# Patient Record
Sex: Male | Born: 1975
Health system: Southern US, Community
[De-identification: ages and names within clinical notes are randomized; demographics above are authoritative.]

## PROBLEM LIST (undated history)

## (undated) DIAGNOSIS — F329 Major depressive disorder, single episode, unspecified: Secondary | ICD-10-CM

## (undated) HISTORY — PX: APPENDECTOMY: SHX54

## (undated) HISTORY — PX: NASAL SINUS SURGERY: SHX719

## (undated) HISTORY — DX: Major depressive disorder, single episode, unspecified: F32.9

---

## 2004-12-06 DIAGNOSIS — F32A Depression, unspecified: Secondary | ICD-10-CM

## 2004-12-06 HISTORY — DX: Depression, unspecified: F32.A

## 2016-03-01 ENCOUNTER — Encounter: Payer: Self-pay | Admitting: Family Medicine

## 2016-03-01 ENCOUNTER — Ambulatory Visit (INDEPENDENT_AMBULATORY_CARE_PROVIDER_SITE_OTHER): Payer: Medicaid Other | Admitting: Family Medicine

## 2016-03-01 VITALS — BP 116/80 | HR 79 | Temp 97.1°F | Ht 72.0 in | Wt 219.6 lb

## 2016-03-01 DIAGNOSIS — S8392XA Sprain of unspecified site of left knee, initial encounter: Secondary | ICD-10-CM | POA: Diagnosis not present

## 2016-03-01 DIAGNOSIS — S838X2A Sprain of other specified parts of left knee, initial encounter: Secondary | ICD-10-CM

## 2016-03-01 DIAGNOSIS — Z0000 Encounter for general adult medical examination without abnormal findings: Secondary | ICD-10-CM | POA: Diagnosis not present

## 2016-03-01 DIAGNOSIS — F17200 Nicotine dependence, unspecified, uncomplicated: Secondary | ICD-10-CM

## 2016-03-01 DIAGNOSIS — F411 Generalized anxiety disorder: Secondary | ICD-10-CM

## 2016-03-01 MED ORDER — METHYLPREDNISOLONE ACETATE 80 MG/ML IJ SUSP
80.0000 mg | Freq: Once | INTRAMUSCULAR | Status: AC
  Administered 2016-03-01: 80 mg via INTRA_ARTICULAR

## 2016-03-01 MED ORDER — BUPROPION HCL ER (XL) 150 MG PO TB24: 150.0000 mg | ORAL_TABLET | Freq: Every day | ORAL | Status: DC

## 2016-03-01 NOTE — Progress Notes (Signed)
BP 116/80 mmHg  Pulse 79  Temp(Src) 97.1 F (36.2 C) (Oral)  Ht 6' (1.829 m)  Wt 219 lb 9.6 oz (99.61 kg)  BMI 29.78 kg/m2   Subjective:    Patient ID: Jason Alexander, male    DOB: 1976-08-06, 40 y.o.   MRN: [id number]  HPI: Jason Alexander is a 40 y.o. male presenting on 03/01/2016 for Establish Care; Anxiety; and ADD   HPI Well adult exam Patient is coming in today to establish care with [place] and get a general well adult exam. He uses occasional allergy medication and ibuprofen for anti-inflammatory for his knee more recently. He denies any chest pain, shortness of breath, headaches or vision issues, abdominal complaints, diarrhea, nausea, vomiting.   Anxiety Patient is coming in for anxiety disorder and attention issues and sleep issues because of his anxiety and his mind racing. He is denies any sadness or feeling down. He says his anxiety as stemming from his family living situation. He has had it most of his life. He says is been on ADD medications previously since he was young. He denies any suicidal ideations or thoughts of hurting himself. He would like to discuss possible medications for both and going to psychiatry. He has been on multiple different medications previously.  Left knee pain. Patient comes in with complaints of left medial knee pain that he has had for the past month. She does not recall any specific incident where he injured it. He has been taking anti-inflammatories such as ibuprofen to help with that and it has helped some but not completely. He has had intermittently swelling in that knee as well. He has used some icing occasionally. He did play sports such as football and wrestling when he was in high school and thinks that the pain that he hasn't missed any may have stemmed from injuries when he was in high school. He denies any warmth or redness or fevers or chills. He denies any overlying skin changes.  Relevant past medical, surgical, family and social history  reviewed and updated as indicated. Interim medical history since our last visit reviewed. Allergies and medications reviewed and updated.  Review of Systems  Constitutional: Negative for fever and chills.  HENT: Negative for congestion, ear discharge and ear pain.   Eyes: Negative for discharge and visual disturbance.  Respiratory: Negative for cough, shortness of breath and wheezing.   Cardiovascular: Negative for chest pain and leg swelling.  Gastrointestinal: Negative for abdominal pain, diarrhea and constipation.  Genitourinary: Negative for difficulty urinating.  Musculoskeletal: Positive for joint swelling and arthralgias. Negative for back pain and gait problem.  Skin: Negative for rash.  Neurological: Negative for syncope, light-headedness and headaches.  Psychiatric/Behavioral: Positive for sleep disturbance. Negative for suicidal ideas, self-injury, dysphoric mood and decreased concentration. The patient is nervous/anxious.   All other systems reviewed and are negative.   Per HPI unless specifically indicated above  Social History   Social History  . Marital Status: Married    Spouse Name: N/A  . Number of Children: N/A  . Years of Education: N/A   Occupational History  . Not on file.   Social History Main Topics  . Smoking status: Current Every Day Smoker -- 2.00 packs/day for 20 years    Types: Cigarettes  . Smokeless tobacco: Former Systems developer    Quit date: 01/06/2011  . Alcohol Use: No  . Drug Use: Yes    Special: Marijuana     Comment: doesn't smoke, edibles  .  Sexual Activity: Yes     Comment: same partner male partner for 10 years, 2 kids   Other Topics Concern  . Not on file   Social History Narrative  . No narrative on file    Past Surgical History  Procedure Laterality Date  . Nasal sinus surgery      polyp removal  . Appendectomy      Family History  Problem Relation Age of Onset  . ADD / ADHD Mother   . Hepatitis C Mother   . HIV Mother     . COPD Mother   . Alcohol abuse Mother   . Drug abuse Mother   . Heart disease Mother   . Heart attack Mother   . Hypertension Sister       Medication List       This list is accurate as of: 03/01/16 10:35 AM.  Always use your most recent med list.               buPROPion 150 MG 24 hr tablet  Commonly known as:  WELLBUTRIN XL  Take 1 tablet (150 mg total) by mouth daily.     cetirizine 10 MG tablet  Commonly known as:  ZYRTEC  Take 10 mg by mouth daily.     ibuprofen 200 MG tablet  Commonly known as:  ADVIL,MOTRIN  Take 200 mg by mouth every 6 (six) hours as needed.           Objective:    BP 116/80 mmHg  Pulse 79  Temp(Src) 97.1 F (36.2 C) (Oral)  Ht 6' (1.829 m)  Wt 219 lb 9.6 oz (99.61 kg)  BMI 29.78 kg/m2  Wt Readings from Last 3 Encounters:  03/01/16 219 lb 9.6 oz (99.61 kg)    Physical Exam  Constitutional: He is oriented to person, place, and time. He appears well-developed and well-nourished. No distress.  Eyes: Conjunctivae and EOM are normal. Pupils are equal, round, and reactive to light. Right eye exhibits no discharge. No scleral icterus.  Cardiovascular: Normal rate, regular rhythm, normal heart sounds and intact distal pulses.   No murmur heard. Pulmonary/Chest: Effort normal and breath sounds normal. No respiratory distress. He has no wheezes.  Musculoskeletal: Normal range of motion. He exhibits no edema.       Left knee: He exhibits abnormal meniscus. He exhibits normal range of motion, no swelling, no erythema, normal alignment, no LCL laxity, normal patellar mobility, no bony tenderness and no MCL laxity. Tenderness found. Medial joint line tenderness noted.  Neurological: He is alert and oriented to person, place, and time. Coordination normal.  Skin: Skin is warm and dry. No rash noted. He is not diaphoretic.  Psychiatric: His speech is normal and behavior is normal. Judgment and thought content normal. His mood appears anxious. He does  not exhibit a depressed mood. He expresses no suicidal ideation. He expresses no suicidal plans.  Vitals reviewed.  Knee injection: Risk factors of bleeding and infection discussed with patient and patient is agreeable towards injection. Patient prepped with Betadine. Lateral approach towards injection used. Injected 62m of Depo-Medrol and 1 mL of 2% lidocaine. Patient tolerated procedure well and no side effects from noted. Minimal to no bleeding. Simple bandage applied after.  No results found for this or any previous visit.    Assessment & Plan:   Problem List Items Addressed This Visit      Other   Generalized anxiety disorder - Primary   Relevant Medications  buPROPion (WELLBUTRIN XL) 150 MG 24 hr tablet    Other Visit Diagnoses    Needs smoking cessation education        Relevant Medications    buPROPion (WELLBUTRIN XL) 150 MG 24 hr tablet    Other Relevant Orders    Ambulatory referral to Psychiatry    Well adult exam        Relevant Orders    HIV antibody (Completed)    CMP14+EGFR (Completed)    Lipid panel (Completed)    Meniscal injury, left, initial encounter        Relevant Medications    methylPREDNISolone acetate (DEPO-MEDROL) injection 80 mg (Completed)        Follow up plan: Return in about 4 weeks (around 03/29/2016), or if symptoms worsen or fail to improve, for recheck anxiety and smoking.  Caryl Pina, MD [facility] 03/01/2016, 10:35 AM

## 2016-03-02 LAB — CMP14+EGFR
ALT: 14 IU/L (ref 0–44)
AST: 14 IU/L (ref 0–40)
Albumin/Globulin Ratio: 1.6 (ref 1.2–2.2)
Albumin: 4.8 g/dL (ref 3.5–5.5)
Alkaline Phosphatase: 64 IU/L (ref 39–117)
BUN/Creatinine Ratio: 17 (ref 8–19)
BUN: 17 mg/dL (ref 6–20)
Bilirubin Total: 0.3 mg/dL (ref 0.0–1.2)
CALCIUM: 9.2 mg/dL (ref 8.7–10.2)
CO2: 21 mmol/L (ref 18–29)
CREATININE: 1.02 mg/dL (ref 0.76–1.27)
Chloride: 100 mmol/L (ref 96–106)
GFR, EST AFRICAN AMERICAN: 108 mL/min/{1.73_m2} (ref >59)
GFR, EST NON AFRICAN AMERICAN: 93 mL/min/{1.73_m2} (ref >59)
GLOBULIN, TOTAL: 3 g/dL (ref 1.5–4.5)
Glucose: 89 mg/dL (ref 65–99)
Potassium: 5 mmol/L (ref 3.5–5.2)
Sodium: 139 mmol/L (ref 134–144)
TOTAL PROTEIN: 7.8 g/dL (ref 6.0–8.5)

## 2016-03-02 LAB — LIPID PANEL
CHOL/HDL RATIO: 4.4 ratio (ref 0.0–5.0)
Cholesterol, Total: 271 mg/dL — ABNORMAL HIGH (ref 100–199)
HDL: 61 mg/dL (ref >39)
LDL CALC: 196 mg/dL — AB (ref 0–99)
TRIGLYCERIDES: 70 mg/dL (ref 0–149)
VLDL Cholesterol Cal: 14 mg/dL (ref 5–40)

## 2016-03-02 LAB — HIV ANTIBODY (ROUTINE TESTING W REFLEX): HIV Screen 4th Generation wRfx: NONREACTIVE

## 2016-03-03 MED ORDER — ATORVASTATIN CALCIUM 20 MG PO TABS: 20.0000 mg | ORAL_TABLET | Freq: Every day | ORAL | Status: DC

## 2016-03-16 ENCOUNTER — Telehealth (HOSPITAL_COMMUNITY): Payer: Self-pay | Admitting: *Deleted

## 2016-03-31 ENCOUNTER — Ambulatory Visit: Payer: Medicaid Other | Admitting: Family Medicine

## 2016-10-05 ENCOUNTER — Encounter: Payer: Self-pay | Admitting: Family Medicine

## 2016-10-05 ENCOUNTER — Ambulatory Visit (INDEPENDENT_AMBULATORY_CARE_PROVIDER_SITE_OTHER): Payer: Medicaid Other | Admitting: Family Medicine

## 2016-10-05 ENCOUNTER — Ambulatory Visit (INDEPENDENT_AMBULATORY_CARE_PROVIDER_SITE_OTHER): Payer: Medicaid Other

## 2016-10-05 ENCOUNTER — Encounter (INDEPENDENT_AMBULATORY_CARE_PROVIDER_SITE_OTHER): Payer: Self-pay

## 2016-10-05 VITALS — BP 131/75 | HR 73 | Temp 98.1°F | Ht 72.0 in | Wt 233.4 lb

## 2016-10-05 DIAGNOSIS — R05 Cough: Secondary | ICD-10-CM

## 2016-10-05 DIAGNOSIS — F329 Major depressive disorder, single episode, unspecified: Secondary | ICD-10-CM | POA: Insufficient documentation

## 2016-10-05 DIAGNOSIS — R059 Cough, unspecified: Secondary | ICD-10-CM

## 2016-10-05 DIAGNOSIS — R635 Abnormal weight gain: Secondary | ICD-10-CM | POA: Diagnosis not present

## 2016-10-05 DIAGNOSIS — E782 Mixed hyperlipidemia: Secondary | ICD-10-CM | POA: Diagnosis not present

## 2016-10-05 DIAGNOSIS — F17200 Nicotine dependence, unspecified, uncomplicated: Secondary | ICD-10-CM

## 2016-10-05 DIAGNOSIS — F3289 Other specified depressive episodes: Secondary | ICD-10-CM | POA: Diagnosis not present

## 2016-10-05 DIAGNOSIS — F32A Depression, unspecified: Secondary | ICD-10-CM | POA: Insufficient documentation

## 2016-10-05 DIAGNOSIS — R6882 Decreased libido: Secondary | ICD-10-CM

## 2016-10-05 DIAGNOSIS — N521 Erectile dysfunction due to diseases classified elsewhere: Secondary | ICD-10-CM | POA: Diagnosis not present

## 2016-10-05 MED ORDER — DULOXETINE HCL 30 MG PO CPEP: 30.0000 mg | ORAL_CAPSULE | Freq: Every day | ORAL | 0 refills | Status: DC

## 2016-10-05 NOTE — Progress Notes (Signed)
Subjective:  Patient ID: Jason Alexander, male    DOB: [birth year]  Age: 40 y.o. MRN: [id number]  CC: Anxiety (pt here today for routine follow up on cholesterol and anxiety/depression, pt only took his lipitor and wellbutrin for about one month and then didn't realize he needed to take them every day)   HPI Jason Alexander presents for symptoms related to fatigue, anxiety and depression: GAD 7 : Generalized Anxiety Score 10/05/2016  Nervous, Anxious, on Edge 1  Control/stop worrying 3  Worry too much - different things 3  Trouble relaxing 2  Restless 0  Easily annoyed or irritable 3  Afraid - awful might happen 0  Total GAD 7 Score 12  Anxiety Difficulty Extremely difficult    Depression screen [facility] 2/9 10/05/2016 10/05/2016 03/01/2016  Decreased Interest 3 0 3  Down, Depressed, Hopeless 3 0 1  PHQ - 2 Score 6 0 4  Altered sleeping 3 - 3  Tired, decreased energy 3 - 0  Change in appetite 2 - 1  Feeling bad or failure about yourself  3 - 1  Trouble concentrating 2 - 3  Moving slowly or fidgety/restless 2 - 0  Suicidal thoughts 0 - 0  PHQ-9 Score 21 - 12  Difficult doing work/chores - - Somewhat difficult   Feel like crap all the time. Nothing is satisfying any more. Feels weak, tired. No energy. Loss of strength. Irritable with everyone - wife and boss both have noticed and commented. Sx building for a year. Feels dyspneic. 2 ppd smoker for years. Recent cutback to 1/2 pack. Had twisted testicle 10 years ago. Less libido. Erections not as firm. Masturbating 2X/week, but intercourse down to I time a weekHas gained from185 with six pack to 231 in a year.  History Jason Alexander has a past medical history of Depression (2006).   He has a past surgical history that includes Nasal sinus surgery and Appendectomy.   His family history includes ADD / ADHD in his mother; Alcohol abuse in his mother; COPD in his mother; Drug abuse in his mother; HIV in his mother; Heart attack in his mother;  Heart disease in his mother; Hepatitis C in his mother; Hypertension in his sister.He reports that he has been smoking Cigarettes.  He has a 40.00 pack-year smoking history. He quit smokeless tobacco use about 5 years ago. He reports that he uses drugs, including Marijuana. He reports that he does not drink alcohol.    ROS Review of Systems  Constitutional: Positive for activity change, fatigue and unexpected weight change. Negative for chills, diaphoresis and fever.  HENT: Negative for congestion, hearing loss, rhinorrhea and sore throat.   Eyes: Negative for visual disturbance.  Respiratory: Positive for cough (spasms of cough awaken him during the night) and shortness of breath.   Cardiovascular: Negative for chest pain and palpitations.  Gastrointestinal: Negative for abdominal pain, constipation and diarrhea.  Genitourinary: Negative for dysuria and flank pain.  Musculoskeletal: Negative for arthralgias and joint swelling.  Skin: Negative for rash.  Neurological: Positive for dizziness (with cough). Negative for headaches.  Psychiatric/Behavioral: Negative for dysphoric mood and sleep disturbance.    Objective:  BP 131/75   Pulse 73   Temp 98.1 F (36.7 C) (Oral)   Ht 6' (1.829 m)   Wt 233 lb 6 oz (105.9 kg)   BMI 31.65 kg/m   BP Readings from Last 3 Encounters:  10/05/16 131/75  03/01/16 116/80    Wt Readings from Last 3 Encounters:  10/05/16  233 lb 6 oz (105.9 kg)  03/01/16 219 lb 9.6 oz (99.6 kg)     Physical Exam  Constitutional: He is oriented to person, place, and time. He appears well-developed and well-nourished. No distress.  HENT:  Head: Normocephalic and atraumatic.  Right Ear: External ear normal.  Left Ear: External ear normal.  Nose: Nose normal.  Mouth/Throat: Oropharynx is clear and moist.  Eyes: Conjunctivae and EOM are normal. Pupils are equal, round, and reactive to light.  Neck: Normal range of motion. Neck supple. No thyromegaly present.    Cardiovascular: Normal rate, regular rhythm and normal heart sounds.   No murmur heard. Pulmonary/Chest: Effort normal and breath sounds normal. No respiratory distress. He has no wheezes. He has no rales.  Abdominal: Soft. Bowel sounds are normal. He exhibits no distension. There is no tenderness. Hernia confirmed negative in the right inguinal area and confirmed negative in the left inguinal area.  Genitourinary: Penis normal. Right testis shows no mass, no swelling and no tenderness. Left testis shows no mass, no swelling and no tenderness.  Genitourinary Comments: Left testicle is small, less firm  Lymphadenopathy:    He has no cervical adenopathy.  Neurological: He is alert and oriented to person, place, and time. He has normal reflexes.  Skin: Skin is warm and dry.  Psychiatric: He has a normal mood and affect. His behavior is normal. Judgment and thought content normal.     Lab Results  Component Value Date   GLUCOSE 89 03/01/2016   CHOL 271 (H) 03/01/2016   TRIG 70 03/01/2016   HDL 61 03/01/2016   LDLCALC 196 (H) 03/01/2016   ALT 14 03/01/2016   AST 14 03/01/2016   NA 139 03/01/2016   K 5.0 03/01/2016   CL 100 03/01/2016   CREATININE 1.02 03/01/2016   BUN 17 03/01/2016   CO2 21 03/01/2016    Patient was never admitted.  Assessment & Plan:   Jason Alexander was seen today for anxiety.  Diagnoses and all orders for this visit:  Loss of libido -     CBC with Differential/Platelet -     CMP14+EGFR -     Thyroid Panel With TSH -     Testosterone,Free and Total  Other depression -     CBC with Differential/Platelet -     CMP14+EGFR -     Thyroid Panel With TSH -     Testosterone,Free and Total  Cough -     CBC with Differential/Platelet -     CMP14+EGFR -     DG Chest 2 View; Future -     PR BREATHING CAPACITY TEST  Current every day smoker -     CBC with Differential/Platelet -     CMP14+EGFR -     DG Chest 2 View; Future -     PR BREATHING CAPACITY  TEST  Weight gain, abnormal -     CBC with Differential/Platelet -     CMP14+EGFR -     Thyroid Panel With TSH -     Testosterone,Free and Total  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 For one week then two daily. Take with a full stomach at suppertime    I have discontinued Mr.  Alexander's ibuprofen and cetirizine. I am also having him start on DULoxetine. Additionally, I am having him maintain his buPROPion and atorvastatin.  Meds ordered this encounter  Medications  . DULoxetine (CYMBALTA) 30 MG capsule    Sig: Take 1 capsule (30 mg total) by mouth daily. For one week then two daily. Take with a full stomach at suppertime    Dispense:  60 capsule    Refill:  0     Follow-up: Return in about 10 days (around 10/15/2016) for Depression.  Claretta Fraise, M.D.

## 2016-10-06 LAB — LIPID PANEL
CHOLESTEROL TOTAL: 261 mg/dL — AB (ref 100–199)
Chol/HDL Ratio: 5.7 ratio units — ABNORMAL HIGH (ref 0.0–5.0)
HDL: 46 mg/dL (ref >39)
LDL Calculated: 197 mg/dL — ABNORMAL HIGH (ref 0–99)
Triglycerides: 88 mg/dL (ref 0–149)
VLDL CHOLESTEROL CAL: 18 mg/dL (ref 5–40)

## 2016-10-06 LAB — CBC WITH DIFFERENTIAL/PLATELET
BASOS ABS: 0 10*3/uL (ref 0.0–0.2)
Basos: 0 %
EOS (ABSOLUTE): 0.3 10*3/uL (ref 0.0–0.4)
Eos: 3 %
HEMOGLOBIN: 14.7 g/dL (ref 12.6–17.7)
Hematocrit: 43.8 % (ref 37.5–51.0)
Immature Grans (Abs): 0 10*3/uL (ref 0.0–0.1)
Immature Granulocytes: 0 %
LYMPHS ABS: 2 10*3/uL (ref 0.7–3.1)
Lymphs: 23 %
MCH: 31.7 pg (ref 26.6–33.0)
MCHC: 33.6 g/dL (ref 31.5–35.7)
MCV: 95 fL (ref 79–97)
MONOCYTES: 7 %
MONOS ABS: 0.6 10*3/uL (ref 0.1–0.9)
Neutrophils Absolute: 5.9 10*3/uL (ref 1.4–7.0)
Neutrophils: 67 %
PLATELETS: 276 10*3/uL (ref 150–379)
RBC: 4.63 x10E6/uL (ref 4.14–5.80)
RDW: 13.9 % (ref 12.3–15.4)
WBC: 8.7 10*3/uL (ref 3.4–10.8)

## 2016-10-06 LAB — TESTOSTERONE,FREE AND TOTAL
TESTOSTERONE FREE: 12.7 pg/mL (ref 8.7–25.1)
Testosterone: 386 ng/dL (ref 264–916)

## 2016-10-06 LAB — CMP14+EGFR
ALK PHOS: 59 IU/L (ref 39–117)
ALT: 13 IU/L (ref 0–44)
AST: 15 IU/L (ref 0–40)
Albumin/Globulin Ratio: 1.7 (ref 1.2–2.2)
Albumin: 4.6 g/dL (ref 3.5–5.5)
BILIRUBIN TOTAL: 0.3 mg/dL (ref 0.0–1.2)
BUN/Creatinine Ratio: 15 (ref 9–20)
BUN: 15 mg/dL (ref 6–20)
CHLORIDE: 101 mmol/L (ref 96–106)
CO2: 22 mmol/L (ref 18–29)
CREATININE: 1 mg/dL (ref 0.76–1.27)
Calcium: 9.3 mg/dL (ref 8.7–10.2)
GFR calc Af Amer: 111 mL/min/{1.73_m2} (ref >59)
GFR calc non Af Amer: 96 mL/min/{1.73_m2} (ref >59)
GLUCOSE: 81 mg/dL (ref 65–99)
Globulin, Total: 2.7 g/dL (ref 1.5–4.5)
Potassium: 4.7 mmol/L (ref 3.5–5.2)
Sodium: 139 mmol/L (ref 134–144)
Total Protein: 7.3 g/dL (ref 6.0–8.5)

## 2016-10-06 LAB — THYROID PANEL WITH TSH
Free Thyroxine Index: 2.2 (ref 1.2–4.9)
T3 Uptake Ratio: 31 % (ref 24–39)
T4, Total: 7 ug/dL (ref 4.5–12.0)
TSH: 0.979 u[IU]/mL (ref 0.450–4.500)

## 2016-10-11 ENCOUNTER — Other Ambulatory Visit: Payer: Self-pay | Admitting: *Deleted

## 2016-10-11 MED ORDER — ATORVASTATIN CALCIUM 40 MG PO TABS: 40.0000 mg | ORAL_TABLET | Freq: Every day | ORAL | 3 refills | Status: DC

## 2016-10-18 ENCOUNTER — Ambulatory Visit: Payer: Medicaid Other | Admitting: Family Medicine

## 2016-10-25 ENCOUNTER — Encounter: Payer: Self-pay | Admitting: Family Medicine

## 2016-10-25 ENCOUNTER — Ambulatory Visit (INDEPENDENT_AMBULATORY_CARE_PROVIDER_SITE_OTHER): Payer: Medicaid Other | Admitting: Family Medicine

## 2016-10-25 VITALS — BP 112/69 | HR 72 | Temp 98.1°F | Ht 72.0 in | Wt 230.0 lb

## 2016-10-25 DIAGNOSIS — F411 Generalized anxiety disorder: Secondary | ICD-10-CM

## 2016-10-25 DIAGNOSIS — F3289 Other specified depressive episodes: Secondary | ICD-10-CM

## 2016-10-25 MED ORDER — DULOXETINE HCL 60 MG PO CPEP: 60.0000 mg | ORAL_CAPSULE | Freq: Every day | ORAL | 1 refills | Status: DC

## 2016-10-25 MED ORDER — TRAZODONE HCL 150 MG PO TABS: ORAL_TABLET | ORAL | 5 refills | Status: DC

## 2016-10-25 NOTE — Progress Notes (Signed)
Subjective:  Patient ID: Jason Alexander, male    DOB: 12-Feb-1976  Age: 40 y.o. MRN: [id number]  CC: Depression (pt here today following up for anxiety/depression, he started Cymbalta about 3 weeks ago and says he doesn't think it is working. )   HPI Jason Alexander presents for Feeling anxious and on hemorrhage every day. He says that he was irritable when he was checking in due to some type of insurance requirement he was asked to meet. He feels like he is almost blacking out when he becomes anxious. He is also not sleeping. Consider an average of 2-3 hours per night. Patient now says 6-8 years ago he had to be on 800 mg a day and Seroquel for depression. He was also taking trazodone at that time for sleep. He was living in [place] at that time. Social situation then as now is complicated.  [facility] 10/25/2016 10/05/2016 10/05/2016 03/01/2016  Decreased Interest 3 3 0 3  Down, Depressed, Hopeless 3 3 0 1  PHQ - 2 Score 6 6 0 4  Altered sleeping 2 3 - 3  Tired, decreased energy 3 3 - 0  Change in appetite 1 2 - 1  Feeling bad or failure about yourself  3 3 - 1  Trouble concentrating 3 2 - 3  Moving slowly or fidgety/restless 2 2 - 0  Suicidal thoughts 0 0 - 0  PHQ-9 Score 20 21 - 12  Difficult doing work/chores - - - Somewhat difficult    History Jason Alexander has a past medical history of Depression (2006).   He has a past surgical history that includes Nasal sinus surgery and Appendectomy.   His family history includes ADD / ADHD in his mother; Alcohol abuse in his mother; COPD in his mother; Drug abuse in his mother; HIV in his mother; Heart attack in his mother; Heart disease in his mother; Hepatitis C in his mother; Hypertension in his sister.He reports that he has been smoking Cigarettes.  He has a 40.00 pack-year smoking history. He quit smokeless tobacco use about 5 years ago. He reports that he uses drugs, including Marijuana. He reports that he does not drink  alcohol.    ROS Review of Systems  Constitutional: Negative for chills, diaphoresis and fever.  HENT: Negative for rhinorrhea and sore throat.   Respiratory: Negative for cough and shortness of breath.   Cardiovascular: Negative for chest pain.  Gastrointestinal: Negative for abdominal pain.  Musculoskeletal: Negative for arthralgias and myalgias.  Skin: Negative for rash.  Neurological: Negative for weakness and headaches.  Psychiatric/Behavioral: Positive for agitation, behavioral problems, decreased concentration and sleep disturbance. The patient is nervous/anxious.     Objective:  BP 112/69   Pulse 72   Temp 98.1 F (36.7 C) (Oral)   Ht 6' (1.829 m)   Wt 230 lb (104.3 kg)   BMI 31.19 kg/m   BP Readings from Last 3 Encounters:  10/25/16 112/69  10/05/16 131/75  03/01/16 116/80    Wt Readings from Last 3 Encounters:  10/25/16 230 lb (104.3 kg)  10/05/16 233 lb 6 oz (105.9 kg)  03/01/16 219 lb 9.6 oz (99.6 kg)     Physical Exam  Constitutional: He appears well-developed and well-nourished.  HENT:  Head: Normocephalic and atraumatic.  Right Ear: Tympanic membrane and external ear normal. No decreased hearing is noted.  Left Ear: Tympanic membrane and external ear normal. No decreased hearing is noted.  Mouth/Throat: No oropharyngeal exudate or posterior oropharyngeal erythema.  Eyes: Pupils are equal, round, and reactive to light.  Neck: Normal range of motion. Neck supple.  Cardiovascular: Normal rate and regular rhythm.   No murmur heard. Pulmonary/Chest: Breath sounds normal. No respiratory distress.  Abdominal: Soft. Bowel sounds are normal. He exhibits no mass. There is no tenderness.  Vitals reviewed.    Lab Results  Component Value Date   WBC 8.7 10/05/2016   HCT 43.8 10/05/2016   PLT 276 10/05/2016   GLUCOSE 81 10/05/2016   CHOL 261 (H) 10/05/2016   TRIG 88 10/05/2016   HDL 46 10/05/2016   LDLCALC 197 (H) 10/05/2016   ALT 13 10/05/2016    AST 15 10/05/2016   NA 139 10/05/2016   K 4.7 10/05/2016   CL 101 10/05/2016   CREATININE 1.00 10/05/2016   BUN 15 10/05/2016   CO2 22 10/05/2016   TSH 0.979 10/05/2016    Patient was never admitted.  Assessment & Plan:   Jason Alexander was seen today for depression.  Diagnoses and all orders for this visit:  Other depression  Generalized anxiety disorder  Other orders -     DULoxetine (CYMBALTA) 60 MG capsule; Take 1 capsule (60 mg total) by mouth daily. For one week then two daily. Take with a full stomach at suppertime -     traZODone (DESYREL) 150 MG tablet; Use from 1/3 to 1 tablet nightly as needed for sleep.    I have discontinued Mr. Cameron AliRanew's buPROPion. I have also changed his DULoxetine. Additionally, I am having him start on traZODone. Lastly, I am having him maintain his atorvastatin.  Meds ordered this encounter  Medications  . DULoxetine (CYMBALTA) 60 MG capsule    Sig: Take 1 capsule (60 mg total) by mouth daily. For one week then two daily. Take with a full stomach at suppertime    Dispense:  60 capsule    Refill:  1  . traZODone (DESYREL) 150 MG tablet    Sig: Use from 1/3 to 1 tablet nightly as needed for sleep.    Dispense:  30 tablet    Refill:  5     Follow-up: Return in about 2 weeks (around 11/08/2016).  Mechele ClaudeWarren Jacilyn Sanpedro, M.D.

## 2016-11-08 ENCOUNTER — Encounter: Payer: Self-pay | Admitting: Family Medicine

## 2016-11-08 ENCOUNTER — Ambulatory Visit (INDEPENDENT_AMBULATORY_CARE_PROVIDER_SITE_OTHER): Payer: Medicaid Other | Admitting: Family Medicine

## 2016-11-08 VITALS — BP 144/97 | HR 89 | Temp 97.2°F | Ht 72.0 in | Wt 225.0 lb

## 2016-11-08 DIAGNOSIS — N528 Other male erectile dysfunction: Secondary | ICD-10-CM | POA: Insufficient documentation

## 2016-11-08 DIAGNOSIS — F3289 Other specified depressive episodes: Secondary | ICD-10-CM | POA: Diagnosis not present

## 2016-11-08 MED ORDER — SILDENAFIL CITRATE 20 MG PO TABS: 20.0000 mg | ORAL_TABLET | Freq: Every day | ORAL | 5 refills | Status: DC | PRN

## 2016-11-08 MED ORDER — MIRTAZAPINE 45 MG PO TABS: 45.0000 mg | ORAL_TABLET | Freq: Every day | ORAL | 2 refills | Status: DC

## 2016-11-08 NOTE — Patient Instructions (Signed)
Discontinue the "Gummies" for sleep. Discontinue trazodone and cymbalta. Instead take remeron (mirtazipine) at bedtime. Get the generic for vi[facility] [place]

## 2016-11-08 NOTE — Progress Notes (Signed)
Subjective:  Patient ID: Jason Alexander, male    DOB: [birth year]  Age: 40 y.o. MRN: [id number]  CC: Follow-up (2 wk rck on new anti depressant meds)   HPI Jason Alexander presents for Continued concerns about depression. He says he couldn't tolerate the higher dose of Cymbalta so he went back to once a day. Unfortunately this is lead to symptoms noted below on the pH Q. [facility] screen [facility] 11/08/2016 10/25/2016 10/05/2016 10/05/2016 03/01/2016  Decreased Interest 2 3 3  0 3  Down, Depressed, Hopeless 1 3 3  0 1  PHQ - 2 Score 3 6 6  0 4  Altered sleeping 1 2 3  - 3  Tired, decreased energy 3 3 3  - 0  Change in appetite 1 1 2  - 1  Feeling bad or failure about yourself  2 3 3  - 1  Trouble concentrating 3 3 2  - 3  Moving slowly or fidgety/restless 3 2 2  - 0  Suicidal thoughts 0 0 0 - 0  PHQ-9 Score 16 20 21  - 12  Difficult doing work/chores - - - - Somewhat difficult   He also notes that his sex drive is very low and because of this at times he can't have an erection. This is leading to frustration for both he and his wife. He is not sleeping well. The trazodone doesn't help him sleep it just makes him feel weird and spaced out. He says he is taking marijuana [place] to help him sleep at night.  History Jason Alexander has a past medical history of Depression (2006).   He has a past surgical history that includes Nasal sinus surgery and Appendectomy.   His family history includes ADD / ADHD in his mother; Alcohol abuse in his mother; COPD in his mother; Drug abuse in his mother; HIV in his mother; Heart attack in his mother; Heart disease in his mother; Hepatitis C in his mother; Hypertension in his sister.He reports that he has been smoking Cigarettes.  He has a 40.00 pack-year smoking history. He quit smokeless tobacco use about 5 years ago. He reports that he uses drugs, including Marijuana. He reports that he does not drink alcohol.    ROS Review of Systems  Constitutional: Negative for  chills, diaphoresis and fever.  HENT: Negative for rhinorrhea and sore throat.   Respiratory: Negative for cough and shortness of breath.   Cardiovascular: Negative for chest pain.  Gastrointestinal: Negative for abdominal pain.  Musculoskeletal: Negative for arthralgias and myalgias.  Skin: Negative for rash.  Neurological: Negative for weakness and headaches.  Psychiatric/Behavioral: Positive for confusion, decreased concentration, dysphoric mood and sleep disturbance. Negative for self-injury and suicidal ideas. The patient is nervous/anxious.     Objective:  BP (!) 144/97   Pulse 89   Temp 97.2 F (36.2 C) (Oral)   Ht 6' (1.829 m)   Wt 225 lb (102.1 kg)   BMI 30.52 kg/m   BP Readings from Last 3 Encounters:  11/08/16 (!) 144/97  10/25/16 112/69  10/05/16 131/75    Wt Readings from Last 3 Encounters:  11/08/16 225 lb (102.1 kg)  10/25/16 230 lb (104.3 kg)  10/05/16 233 lb 6 oz (105.9 kg)     Physical Exam  Constitutional: He is oriented to person, place, and time. He appears well-developed and well-nourished. No distress.  HENT:  Head: Normocephalic and atraumatic.  Right Ear: External ear normal.  Left Ear: External ear normal.  Nose: Nose normal.  Mouth/Throat: Oropharynx is clear and moist.  Eyes: Conjunctivae and EOM are normal. Pupils are equal, round, and reactive to light.  Neck: Normal range of motion. Neck supple. No thyromegaly present.  Cardiovascular: Normal rate, regular rhythm and normal heart sounds.   No murmur heard. Pulmonary/Chest: Effort normal and breath sounds normal. No respiratory distress. He has no wheezes. He has no rales.  Abdominal: Soft. Bowel sounds are normal. He exhibits no distension. There is no tenderness.  Lymphadenopathy:    He has no cervical adenopathy.  Neurological: He is alert and oriented to person, place, and time. He has normal reflexes.  Skin: Skin is warm and dry.  Psychiatric: He has a normal mood and affect. His  behavior is normal. Judgment and thought content normal.     Lab Results  Component Value Date   WBC 8.7 10/05/2016   HCT 43.8 10/05/2016   PLT 276 10/05/2016   GLUCOSE 81 10/05/2016   CHOL 261 (H) 10/05/2016   TRIG 88 10/05/2016   HDL 46 10/05/2016   LDLCALC 197 (H) 10/05/2016   ALT 13 10/05/2016   AST 15 10/05/2016   NA 139 10/05/2016   K 4.7 10/05/2016   CL 101 10/05/2016   CREATININE 1.00 10/05/2016   BUN 15 10/05/2016   CO2 22 10/05/2016   TSH 0.979 10/05/2016    Patient was never admitted.  Assessment & Plan:   Jason Alexander was seen today for follow-up.  Diagnoses and all orders for this visit:  Other depression  Other male erectile dysfunction  Other orders -     mirtazapine (REMERON) 45 MG tablet; Take 1 tablet (45 mg total) by mouth at bedtime. For depression and sleep -     sildenafil (REVATIO) 20 MG tablet; Take 1 tablet (20 mg total) by mouth daily as needed (2-5 as needed).   Discontinue the "Gummies" for sleep. (marijuana) Discontinue trazodone and cymbalta. Instead take remeron (mirtazipine) at bedtime. [facility] the generic for vi[facility] [place]   I have discontinued Mr. Cameron AliRanew's DULoxetine and traZODone. I am also having him start on mirtazapine and sildenafil. Additionally, I am having him maintain his atorvastatin.  Meds ordered this encounter  Medications  . mirtazapine (REMERON) 45 MG tablet    Sig: Take 1 tablet (45 mg total) by mouth at bedtime. For depression and sleep    Dispense:  30 tablet    Refill:  2  . sildenafil (REVATIO) 20 MG tablet    Sig: Take 1 tablet (20 mg total) by mouth daily as needed (2-5 as needed).    Dispense:  50 tablet    Refill:  5     Follow-up: Return in about 1 month (around 12/09/2016).  Mechele ClaudeWarren Josehua Hammar, M.D.

## 2016-11-24 ENCOUNTER — Encounter: Payer: Self-pay | Admitting: *Deleted

## 2016-12-13 ENCOUNTER — Ambulatory Visit: Payer: Medicaid Other | Admitting: Family Medicine

## 2016-12-14 ENCOUNTER — Ambulatory Visit: Payer: Medicaid Other | Admitting: Family Medicine

## 2016-12-15 ENCOUNTER — Encounter: Payer: Self-pay | Admitting: Family Medicine

## 2016-12-15 ENCOUNTER — Telehealth: Payer: Self-pay | Admitting: Family Medicine

## 2017-02-24 ENCOUNTER — Emergency Department (HOSPITAL_COMMUNITY): Payer: Medicaid Other

## 2017-02-24 ENCOUNTER — Emergency Department (HOSPITAL_COMMUNITY)
Admission: EM | Admit: 2017-02-24 | Discharge: 2017-02-24 | Disposition: A | Discharge status: Home / Self Care | Payer: Medicaid Other | Attending: Emergency Medicine | Admitting: Emergency Medicine

## 2017-02-24 ENCOUNTER — Encounter (HOSPITAL_COMMUNITY): Payer: Self-pay

## 2017-02-24 DIAGNOSIS — N201 Calculus of ureter: Secondary | ICD-10-CM | POA: Diagnosis not present

## 2017-02-24 DIAGNOSIS — F17210 Nicotine dependence, cigarettes, uncomplicated: Secondary | ICD-10-CM | POA: Diagnosis not present

## 2017-02-24 DIAGNOSIS — N50812 Left testicular pain: Secondary | ICD-10-CM | POA: Diagnosis not present

## 2017-02-24 DIAGNOSIS — R109 Unspecified abdominal pain: Secondary | ICD-10-CM | POA: Diagnosis present

## 2017-02-24 LAB — URINALYSIS, MICROSCOPIC (REFLEX)
SQUAMOUS EPITHELIAL / LPF: NONE SEEN
WBC UA: NONE SEEN WBC/hpf (ref 0–5)

## 2017-02-24 LAB — CBC
HEMATOCRIT: 39.1 % (ref 39.0–52.0)
HEMOGLOBIN: 13.8 g/dL (ref 13.0–17.0)
MCH: 31.2 pg (ref 26.0–34.0)
MCHC: 35.3 g/dL (ref 30.0–36.0)
MCV: 88.5 fL (ref 78.0–100.0)
Platelets: 250 10*3/uL (ref 150–400)
RBC: 4.42 MIL/uL (ref 4.22–5.81)
RDW: 12.8 % (ref 11.5–15.5)
WBC: 7.9 10*3/uL (ref 4.0–10.5)

## 2017-02-24 LAB — URINALYSIS, ROUTINE W REFLEX MICROSCOPIC
Bilirubin Urine: NEGATIVE
Glucose, UA: NEGATIVE mg/dL
Ketones, ur: 15 mg/dL — AB
LEUKOCYTES UA: NEGATIVE
Nitrite: NEGATIVE
PROTEIN: NEGATIVE mg/dL
Specific Gravity, Urine: 1.02 (ref 1.005–1.030)
pH: 8 (ref 5.0–8.0)

## 2017-02-24 LAB — BASIC METABOLIC PANEL
ANION GAP: 11 (ref 5–15)
BUN: 18 mg/dL (ref 6–20)
CALCIUM: 10.1 mg/dL (ref 8.9–10.3)
CHLORIDE: 102 mmol/L (ref 101–111)
CO2: 26 mmol/L (ref 22–32)
Creatinine, Ser: 1.3 mg/dL — ABNORMAL HIGH (ref 0.61–1.24)
GFR calc non Af Amer: >60 mL/min (ref >60)
GLUCOSE: 118 mg/dL — AB (ref 65–99)
POTASSIUM: 3.5 mmol/L (ref 3.5–5.1)
Sodium: 139 mmol/L (ref 135–145)

## 2017-02-24 MED ORDER — FENTANYL CITRATE (PF) 100 MCG/2ML IJ SOLN
50.0000 ug | Freq: Once | INTRAMUSCULAR | Status: AC
  Administered 2017-02-24: 50 ug via INTRAVENOUS
  Filled 2017-02-24: qty 2

## 2017-02-24 MED ORDER — ONDANSETRON HCL 4 MG PO TABS: 4.0000 mg | ORAL_TABLET | Freq: Three times a day (TID) | ORAL | 0 refills | Status: DC | PRN

## 2017-02-24 MED ORDER — ONDANSETRON HCL 4 MG/2ML IJ SOLN
4.0000 mg | Freq: Once | INTRAMUSCULAR | Status: AC
  Administered 2017-02-24: 4 mg via INTRAVENOUS
  Filled 2017-02-24: qty 2

## 2017-02-24 MED ORDER — TAMSULOSIN HCL 0.4 MG PO CAPS: ORAL_CAPSULE | ORAL | 0 refills | Status: DC

## 2017-02-24 MED ORDER — KETOROLAC TROMETHAMINE 30 MG/ML IJ SOLN
30.0000 mg | Freq: Once | INTRAMUSCULAR | Status: AC
  Administered 2017-02-24: 30 mg via INTRAVENOUS
  Filled 2017-02-24: qty 1

## 2017-02-24 MED ORDER — OXYCODONE-ACETAMINOPHEN 5-325 MG PO TABS: 1.0000 | ORAL_TABLET | Freq: Four times a day (QID) | ORAL | 0 refills | Status: DC | PRN

## 2017-02-24 MED ORDER — OXYCODONE-ACETAMINOPHEN 5-325 MG PO TABS
1.0000 | ORAL_TABLET | Freq: Once | ORAL | Status: AC
Start: 2017-02-24 — End: 2017-02-24
  Administered 2017-02-24: 1 via ORAL
  Filled 2017-02-24: qty 1

## 2017-02-24 NOTE — ED Provider Notes (Signed)
10:13 AM Assumed care from Dr. Lynelle DoctorKnapp, please see their note for full history, physical and decision making until this point. In brief this is a 41 y.o. year old male who presented to the ED tonight with Flank Pain     US to ensure no malignancy. otherwise uro follow up.   Patient was some return of his pain. Otherwise all sounds negative. Urine has some bacteria so urine culture added but will not treat at this time. Plan for discharge with urology follow-up if not improving. Otherwise pain medicine and increase fluid intake at home.  Discharge instructions, including strict return precautions for new or worsening symptoms, given. Patient and/or family verbalized understanding and agreement with the plan as described.   Labs, studies and imaging reviewed by myself and considered in medical decision making if ordered. Imaging interpreted by radiology.  Labs Reviewed  URINALYSIS, ROUTINE W REFLEX MICROSCOPIC - Abnormal; Notable for the following:       Result Value   APPearance TURBID (*)    Hgb urine dipstick LARGE (*)    Ketones, ur 15 (*)    All other components within normal limits  BASIC METABOLIC PANEL - Abnormal; Notable for the following:    Glucose, Bld 118 (*)    Creatinine, Ser 1.30 (*)    All other components within normal limits  URINALYSIS, MICROSCOPIC (REFLEX) - Abnormal; Notable for the following:    Bacteria, UA MANY (*)    All other components within normal limits  URINE CULTURE  CBC    US Scrotum  Final Result    US Art/Ven Flow Abd Pelv Doppler  Final Result    CT Renal Stone Study  Final Result      No Follow-up on file.    Marily MemosJason Jaicee Michelotti, MD 02/24/17 1013

## 2017-02-24 NOTE — ED Notes (Signed)
Pt sleeping at this time.

## 2017-02-24 NOTE — ED Triage Notes (Signed)
Patient states that he is having pain in his left side, back, and radiating into groin.  Urine is dark.  Feels like it is on fire.

## 2017-02-24 NOTE — ED Notes (Addendum)
Pt made aware to return if symptoms worsen or if any life threatening symptoms occur.  Pt given strainer and urine cup.

## 2017-02-24 NOTE — Discharge Instructions (Signed)
Drink plenty of fluids. Take the medications as described. You can be rechecked by Dr Ronne BinningMcKenzie, a Urologist (kidney specialist) if you aren't passing the stone. Return to the ED if you get a fever, have uncontrolled vomiting or pain.

## 2017-02-24 NOTE — ED Provider Notes (Signed)
[facility]-EMER[facility]ENCY DEPT Provider Note   CSN: [id number] Arrival date & time: 02/24/17  0543  Time seen 05:50 AM   History   Chief Complaint Chief Complaint  Patient presents with  . Flank Pain    HPI Jason Alexander is a 41 y.o. male.  HPI  patient presents via EMS. He states about 2 hours ago he was still awake because he couldn't sleep because his back was sore. He started getting pain in his left testicle that radiated up into his left abdomen. He has had nausea and dry heaves. He is not vomiting. He states he has had some dark urine but no gross hematuria. He states nothing he does makes the pain hurt more, nothing he does makes it feel better. He is unaware of any family history of kidney stones. 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 He states his urine has been dark for a couple days and he thought maybe he was dehydrated. States he thinks there is a new knot behind his left testicle.   PCP Dr Darlyn Read  Past Medical History:  Diagnosis Date  . Depression 2006   after divorce    Patient Active Problem List   Diagnosis Date Noted  . Other male erectile dysfunction 11/08/2016  . Depression 10/05/2016  . Current every day smoker 10/05/2016  . Weight gain, abnormal 10/05/2016  . Generalized anxiety disorder 03/01/2016    Past Surgical History:  Procedure Laterality Date  . APPENDECTOMY    . NASAL SINUS SURGERY     polyp removal       Home Medications    Prior to Admission medications   Medication Sig Start Date End Date Taking? Authorizing Provider  atorvastatin (LIPITOR) 40 MG tablet Take 1 tablet (40 mg total) by mouth daily. 10/11/16   Mechele Claude, MD  mirtazapine (REMERON) 45 MG tablet Take 1 tablet (45 mg total) by mouth at bedtime. For depression and sleep 11/08/16   Mechele Claude, MD  ondansetron (ZOFRAN) 4 MG tablet Take 1 tablet (4 mg total) by mouth every 8 (eight) hours as needed for nausea or vomiting. 02/24/17   Devoria Albe, MD    oxyCODONE-acetaminophen (PERCOCET/ROXICET) 5-325 MG tablet Take 1 tablet by mouth every 6 (six) hours as needed for severe pain. 02/24/17   Devoria Albe, MD  sildenafil (REVATIO) 20 MG tablet Take 1 tablet (20 mg total) by mouth daily as needed (2-5 as needed). 11/08/16   Mechele Claude, MD  tamsulosin (FLOMAX) 0.4 MG CAPS capsule Take 1 po QD until you pass the stone. 02/24/17   Devoria Albe, MD    Family History Family History  Problem Relation Age of Onset  . ADD / ADHD Mother   . Hepatitis C Mother   . HIV Mother   . COPD Mother   . Alcohol abuse Mother   . Drug abuse Mother   . Heart disease Mother   . Heart attack Mother   . Hypertension Sister     Social History Social History  Substance Use Topics  . Smoking status: Current Every Day Smoker    Packs/day: 2.00    Years: 20.00    Types: Cigarettes  . Smokeless tobacco: Former Neurosurgeon    Quit date: 01/06/2011  . Alcohol use No  employed   Allergies   Depakote [divalproex sodium]   Review of Systems Review of Systems  All other systems reviewed and are negative.    Physical Exam Updated Vital Signs BP 121/90 (BP Location: Left Arm)  Pulse 84   Resp (!) 24   Ht 6' (1.829 m)   Wt 215 lb (97.5 kg)   SpO2 98%   BMI 29.16 kg/m   Vital signs normal    Physical Exam  Constitutional: He is oriented to person, place, and time. He appears well-developed and well-nourished.  Non-toxic appearance. He does not appear ill. He appears distressed.  Crying out, rolling around on the stretcher  HENT:  Head: Normocephalic and atraumatic.  Right Ear: External ear normal.  Left Ear: External ear normal.  Nose: Nose normal. No mucosal edema or rhinorrhea.  Mouth/Throat: Oropharynx is clear and moist and mucous membranes are normal. No dental abscesses or uvula swelling.  Eyes: Conjunctivae and EOM are normal. Pupils are equal, round, and reactive to light.  Neck: Normal range of motion and full passive range of motion without  pain. Neck supple.  Cardiovascular: Normal rate, regular rhythm and normal heart sounds.  Exam reveals no gallop and no friction rub.   No murmur heard. Pulmonary/Chest: Effort normal and breath sounds normal. No respiratory distress. He has no wheezes. He has no rhonchi. He has no rales. He exhibits no tenderness and no crepitus.  Abdominal: Soft. Normal appearance and bowel sounds are normal. He exhibits no distension. There is no tenderness. There is no rebound and no guarding.    Tender diffusely in his left abdomen  Genitourinary:  Genitourinary Comments: Normal external genitalia, has pain to palpation of his left testicle, however it doesn't feel enlarged.  [place] present.  Musculoskeletal: Normal range of motion. He exhibits no edema or tenderness.       Back:  Moves all extremities well. Very tender in the left flank to palpation and with deep breathing  Neurological: He is alert and oriented to person, place, and time. He has normal strength. No cranial nerve deficit.  Skin: Skin is warm, dry and intact. No rash noted. No erythema. No pallor.  Patient has multiple linear parallel scars of his upper arm consistent with self mutilation.  Psychiatric: He has a normal mood and affect. His speech is normal and behavior is normal. His mood appears not anxious.  Nursing note and vitals reviewed.    ED Treatments / Results  Labs (all labs ordered are listed, but only abnormal results are displayed) Results for orders placed or performed during the hospital encounter of 02/24/17  CBC  Result Value Ref Range   WBC 7.9 4.0 - 10.5 K/uL   RBC 4.42 4.22 - 5.81 MIL/uL   Hemoglobin 13.8 13.0 - 17.0 g/dL   HCT 96.0 45.4 - 09.8 %   MCV 88.5 78.0 - 100.0 fL   MCH 31.2 26.0 - 34.0 pg   MCHC 35.3 30.0 - 36.0 g/dL   RDW 11.9 14.7 - 82.9 %   Platelets 250 150 - 400 K/uL  Basic metabolic panel  Result Value Ref Range   Sodium 139 135 - 145 mmol/L   Potassium 3.5 3.5 - 5.1 mmol/L    Chloride 102 101 - 111 mmol/L   CO2 26 22 - 32 mmol/L   Glucose, Bld 118 (H) 65 - 99 mg/dL   BUN 18 6 - 20 mg/dL   Creatinine, Ser 5.62 (H) 0.61 - 1.24 mg/dL   Calcium 13.0 8.9 - 86.5 mg/dL   GFR calc non Af Amer >60 >60 mL/min   GFR calc Af Amer >60 >60 mL/min   Anion gap 11 5 - 15   Laboratory interpretation all normal except renal  insufficiency    EKG  EKG Interpretation None       Radiology Ct Renal Stone Study  Result Date: 02/24/2017 CLINICAL DATA:  Left-sided flank pain EXAM: CT ABDOMEN AND PELVIS WITHOUT CONTRAST TECHNIQUE: Multidetector CT imaging of the abdomen and pelvis was performed following the standard protocol without IV contrast. COMPARISON:  None. FINDINGS: Lower chest: No pulmonary nodules or pleural effusion. No visible pericardial effusion. Hepatobiliary: Normal noncontrast appearance of the liver. No visible biliary dilatation. Normal gallbladder. Pancreas: Normal noncontrast appearance of the pancreas. No peripancreatic fluid collection. Spleen: Normal. Adrenal glands: Normal. Urinary Tract: --Right kidney/ureter: No hydronephrosis or perinephric stranding. No nephrolithiasis. No obstructing ureteral stones. --Left kidney/ureter: There is mild left hydroureteronephrosis and mild perinephric stranding. There is a stone within the distal left ureter that measures 4 x 3 mm. Additionally, there is a nonobstructing left renal stone that measures 3 mm. --Urinary bladder: Unremarkable. Stomach/Bowel: No dilated loops of bowel. No evidence of colonic or enteric inflammation. No fluid collection within the abdomen. Vascular/Lymphatic: No abdominal aortic aneurysm or atherosclerotic calcification. No abdominal or pelvic lymphadenopathy. Reproductive: Normal prostate and seminal vesicles. Musculoskeletal. No focal osseous lesion. Normal visualized extraperitoneal and extrathoracic soft tissues. IMPRESSION: 1. Left-sided obstructive uropathy with 4 x 3 mm stone in the distal left  ureter causing mild left hydroureteronephrosis and perinephric stranding. 2. There is an additional 3 mm nonobstructing renal calculus in the left kidney. Electronically Signed   By: Deatra RobinsonKevin  Herman M.D.   On: 02/24/2017 06:29    Procedures Procedures (including critical care time)  Medications Ordered in ED Medications  fentaNYL (SUBLIMAZE) injection 50 mcg (not administered)  ketorolac (TORADOL) 30 MG/ML injection 30 mg (30 mg Intravenous Given 02/24/17 0602)  ondansetron (ZOFRAN) injection 4 mg (4 mg Intravenous Given 02/24/17 0603)     Initial Impression / Assessment and Plan / ED Course  I have reviewed the triage vital signs and the nursing notes.  Pertinent labs & imaging results that were available during my care of the patient were reviewed by me and considered in my medical decision making (see chart for details).  Patient was given IV Toradol for pain. CT renal was ordered. Also ultrasound testicle was ordered, however they are not in the hospital yet so we proceeded with CT scan first.  Recheck at 06:38 AM pt no longer yelling out, he is laying still on the stretcher. He is still holding his left abdomen. States his pain isn't gone, but is better. We discussed his CT results. He was given IV fentanyl for pain. Waiting for US.   PT turned over to Dr Clayborne DanaMesner to get results of his testicular US.  Final Clinical Impressions(s) / ED Diagnoses   Final diagnoses:  Pain in left testicle  Left ureteral stone    New Prescriptions New Prescriptions   ONDANSETRON (ZOFRAN) 4 MG TABLET    Take 1 tablet (4 mg total) by mouth every 8 (eight) hours as needed for nausea or vomiting.   OXYCODONE-ACETAMINOPHEN (PERCOCET/ROXICET) 5-325 MG TABLET    Take 1 tablet by mouth every 6 (six) hours as needed for severe pain.   TAMSULOSIN (FLOMAX) 0.4 MG CAPS CAPSULE    Take 1 po QD until you pass the stone.    Disposition discharge  Devoria AlbeIva Suren Payne, MD, Concha PyoFACEP    Danique Hartsough, MD 02/24/17 [phone]

## 2017-02-24 NOTE — ED Notes (Signed)
Patient's sats dropped to 78% per Nilda SimmerK. Belton, RN.  Patient was placed on 2L nasal cannula.

## 2017-02-26 LAB — URINE CULTURE: CULTURE: NO GROWTH

## 2017-02-26 NOTE — ED Notes (Signed)
Pt called wanting info on his meds.

## 2017-09-05 ENCOUNTER — Encounter (HOSPITAL_COMMUNITY): Payer: Self-pay | Admitting: *Deleted

## 2017-09-05 ENCOUNTER — Emergency Department (HOSPITAL_COMMUNITY)
Admission: EM | Admit: 2017-09-05 | Discharge: 2017-09-06 | Disposition: A | Discharge status: Home / Self Care | Payer: Self-pay | Attending: Emergency Medicine | Admitting: Emergency Medicine

## 2017-09-05 DIAGNOSIS — R109 Unspecified abdominal pain: Secondary | ICD-10-CM

## 2017-09-05 DIAGNOSIS — Z79899 Other long term (current) drug therapy: Secondary | ICD-10-CM | POA: Insufficient documentation

## 2017-09-05 DIAGNOSIS — R1084 Generalized abdominal pain: Secondary | ICD-10-CM | POA: Insufficient documentation

## 2017-09-05 DIAGNOSIS — F329 Major depressive disorder, single episode, unspecified: Secondary | ICD-10-CM | POA: Insufficient documentation

## 2017-09-05 DIAGNOSIS — F17210 Nicotine dependence, cigarettes, uncomplicated: Secondary | ICD-10-CM | POA: Insufficient documentation

## 2017-09-05 NOTE — ED Triage Notes (Signed)
Pt brought in by [facility] for c/o right groin pain that radiates around to right flank; pt given toradol  IV en route by ems with decrease in pain from 10/10 to 7/10; pt states he has a hx of kidney stones

## 2017-09-06 LAB — URINALYSIS, ROUTINE W REFLEX MICROSCOPIC
Bacteria, UA: NONE SEEN
Bilirubin Urine: NEGATIVE
Glucose, UA: NEGATIVE mg/dL
KETONES UR: NEGATIVE mg/dL
Leukocytes, UA: NEGATIVE
Nitrite: NEGATIVE
PH: 5 (ref 5.0–8.0)
Protein, ur: 100 mg/dL — AB
SQUAMOUS EPITHELIAL / LPF: NONE SEEN
Specific Gravity, Urine: 1.036 — ABNORMAL HIGH (ref 1.005–1.030)

## 2017-09-06 MED ORDER — TAMSULOSIN HCL 0.4 MG PO CAPS: 0.4000 mg | ORAL_CAPSULE | Freq: Every day | ORAL | 0 refills | Status: DC

## 2017-09-06 MED ORDER — HYDROCODONE-ACETAMINOPHEN 5-325 MG PO TABS
1.0000 | ORAL_TABLET | Freq: Once | ORAL | Status: AC
Start: 2017-09-06 — End: 2017-09-06
  Administered 2017-09-06: 1 via ORAL
  Filled 2017-09-06: qty 1

## 2017-09-06 MED ORDER — ONDANSETRON HCL 4 MG/2ML IJ SOLN
4.0000 mg | Freq: Once | INTRAMUSCULAR | Status: AC
  Administered 2017-09-06: 4 mg via INTRAVENOUS
  Filled 2017-09-06: qty 2

## 2017-09-06 MED ORDER — HYDROCODONE-ACETAMINOPHEN 5-325 MG PO TABS: 1.0000 | ORAL_TABLET | Freq: Four times a day (QID) | ORAL | 0 refills | Status: DC | PRN

## 2017-09-06 MED ORDER — SODIUM CHLORIDE 0.9 % IV BOLUS (SEPSIS)
1000.0000 mL | Freq: Once | INTRAVENOUS | Status: AC
  Administered 2017-09-06: 1000 mL via INTRAVENOUS

## 2017-09-06 MED ORDER — FENTANYL CITRATE (PF) 100 MCG/2ML IJ SOLN
50.0000 ug | Freq: Once | INTRAMUSCULAR | Status: AC
  Administered 2017-09-06: 50 ug via INTRAVENOUS
  Filled 2017-09-06: qty 2

## 2017-09-06 NOTE — ED Provider Notes (Signed)
[facility] DEPT Provider Note   CSN: [id number] Arrival date & time: 09/05/17  2310     History   Chief Complaint Chief Complaint  Patient presents with  . Abdominal Pain    HPI Jason Alexander is a 41 y.o. male.  The history is provided by the patient.  Abdominal Pain   This is a new problem. The current episode started 3 to 5 hours ago. The problem occurs constantly. The problem has been gradually improving. Pain location: right flank. The quality of the pain is sharp. The pain is severe. Associated symptoms include nausea. Pertinent negatives include fever, vomiting and dysuria. The symptoms are aggravated by certain positions. The symptoms are relieved by NSAIDs.  pt with previous h/o left ureteral stone presents with right flank pain that radiates into abdomen and also into groin at times He reports nausea Similar to prior episodes of kidney stones He has never required urologic intervention He received toradol by EMS with some improvement  Past Medical History:  Diagnosis Date  . Depression 2006   after divorce    Patient Active Problem List   Diagnosis Date Noted  . Other male erectile dysfunction 11/08/2016  . Depression 10/05/2016  . Current every day smoker 10/05/2016  . Weight gain, abnormal 10/05/2016  . Generalized anxiety disorder 03/01/2016    Past Surgical History:  Procedure Laterality Date  . APPENDECTOMY    . NASAL SINUS SURGERY     polyp removal       Home Medications    Prior to Admission medications   Medication Sig Start Date End Date Taking? Authorizing Provider  atorvastatin (LIPITOR) 40 MG tablet Take 1 tablet (40 mg total) by mouth daily. 10/11/16   Mechele Claude, MD  DULoxetine (CYMBALTA) 60 MG capsule Take 120 mg by mouth daily.    [provider]  mirtazapine (REMERON) 45 MG tablet Take 1 tablet (45 mg total) by mouth at bedtime. For depression and sleep 11/08/16   Mechele Claude, MD  ondansetron (ZOFRAN) 4 MG tablet  Take 1 tablet (4 mg total) by mouth every 8 (eight) hours as needed for nausea or vomiting. 02/24/17   Devoria Albe, MD  ondansetron (ZOFRAN) 4 MG tablet Take 1 tablet (4 mg total) by mouth every 8 (eight) hours as needed for nausea or vomiting. 02/24/17   Mesner, Barbara Cower, MD  sildenafil (REVATIO) 20 MG tablet Take 1 tablet (20 mg total) by mouth daily as needed (2-5 as needed). 11/08/16   Mechele Claude, MD  traZODone (DESYREL) 150 MG tablet Take 50-150 mg by mouth at bedtime.    [provider]    Family History Family History  Problem Relation Age of Onset  . ADD / ADHD Mother   . Hepatitis C Mother   . HIV Mother   . COPD Mother   . Alcohol abuse Mother   . Drug abuse Mother   . Heart disease Mother   . Heart attack Mother   . Hypertension Sister     Social History Social History  Substance Use Topics  . Smoking status: Current Every Day Smoker    Packs/day: 2.00    Years: 20.00    Types: Cigarettes  . Smokeless tobacco: Former Neurosurgeon    Quit date: 01/06/2011  . Alcohol use No     Allergies   Depakote [divalproex sodium]   Review of Systems Review of Systems  Constitutional: Negative for fever.  Cardiovascular: Negative for chest pain.  Gastrointestinal: Positive for abdominal pain and  nausea. Negative for vomiting.  Genitourinary: Negative for dysuria.  All other systems reviewed and are negative.    Physical Exam Updated Vital Signs BP 134/75 (BP Location: Left Arm)   Pulse 91   Temp (!) 97.4 F (36.3 C) (Oral)   Resp 20   Ht 1.829 m (6')   Wt 97.5 kg (215 lb)   SpO2 97%   BMI 29.16 kg/m   Physical Exam CONSTITUTIONAL: Well developed/well nourished, mildly uncomfortable appearing HEAD: Normocephalic/atraumatic EYES: EOMI/PERRL ENMT: Mucous membranes moist NECK: supple no meningeal signs SPINE/BACK:entire spine nontender CV: S1/S2 noted, no murmurs/rubs/gallops noted LUNGS: Lungs are clear to auscultation bilaterally, no apparent  distress ABDOMEN: soft, nontender, no rebound or guarding, bowel sounds noted throughout abdomen ZO:XWRUE cva tenderness NEURO: Pt is awake/alert/appropriate, moves all extremitiesx4.  No facial droop.   EXTREMITIES: pulses normal/equal, full ROM SKIN: warm, color normal PSYCH: no abnormalities of mood noted, alert and oriented to situation   ED Treatments / Results  Labs (all labs ordered are listed, but only abnormal results are displayed) Labs Reviewed  URINALYSIS, ROUTINE W REFLEX MICROSCOPIC - Abnormal; Notable for the following:       Result Value   Color, Urine AMBER (*)    APPearance CLOUDY (*)    Specific Gravity, Urine 1.036 (*)    Hgb urine dipstick LARGE (*)    Protein, ur 100 (*)    All other components within normal limits    EKG  EKG Interpretation None       Radiology No results found.  Procedures Procedures (including critical care time)  Medications Ordered in ED Medications  HYDROcodone-acetaminophen (NORCO/VICODIN) 5-325 MG per tablet 1 tablet (1 tablet Oral Given 09/06/17 0117)  fentaNYL (SUBLIMAZE) injection 50 mcg (50 mcg Intravenous Given 09/06/17 0202)  ondansetron (ZOFRAN) injection 4 mg (4 mg Intravenous Given 09/06/17 0202)  sodium chloride 0.9 % bolus 1,000 mL (1,000 mLs Intravenous New Bag/Given 09/06/17 0203)     Initial Impression / Assessment and Plan / ED Course  I have reviewed the triage vital signs and the nursing notes.  Pertinent labs results that were available during my care of the patient were reviewed by me and considered in my medical decision making (see chart for details). Narcotic database reviewed and considered in decision making     2:47 AM Strong suspicion for ureteral stone Pt is nontoxic in appearance He had good response to flomax previously Will give this and pain meds Referred to urology Final Clinical Impressions(s) / ED Diagnoses   Final diagnoses:  Flank pain    New Prescriptions New Prescriptions    HYDROCODONE-ACETAMINOPHEN (NORCO/VICODIN) 5-325 MG TABLET    Take 1 tablet by mouth every 6 (six) hours as needed for severe pain.   TAMSULOSIN (FLOMAX) 0.4 MG CAPS CAPSULE    Take 1 capsule (0.4 mg total) by mouth daily after supper.     Zadie Rhine, MD 09/06/17 0330

## 2018-06-22 ENCOUNTER — Ambulatory Visit (INDEPENDENT_AMBULATORY_CARE_PROVIDER_SITE_OTHER): Payer: Medicaid Other | Admitting: Physician Assistant

## 2018-06-22 ENCOUNTER — Encounter: Payer: Self-pay | Admitting: Physician Assistant

## 2018-06-22 VITALS — BP 137/93 | HR 105 | Temp 97.5°F | Ht 72.0 in | Wt 211.8 lb

## 2018-06-22 DIAGNOSIS — S80862A Insect bite (nonvenomous), left lower leg, initial encounter: Secondary | ICD-10-CM

## 2018-06-22 DIAGNOSIS — T63441A Toxic effect of venom of bees, accidental (unintentional), initial encounter: Secondary | ICD-10-CM

## 2018-06-22 DIAGNOSIS — S80861A Insect bite (nonvenomous), right lower leg, initial encounter: Secondary | ICD-10-CM

## 2018-06-22 MED ORDER — DIPHENHYDRAMINE HCL 50 MG/ML IJ SOLN
50.0000 mg | Freq: Once | INTRAMUSCULAR | Status: AC
  Administered 2018-06-22: 50 mg via INTRAMUSCULAR

## 2018-06-22 MED ORDER — PREDNISONE 10 MG (48) PO TBPK: ORAL_TABLET | ORAL | 0 refills | Status: DC

## 2018-06-22 MED ORDER — CEPHALEXIN 500 MG PO CAPS: 500.0000 mg | ORAL_CAPSULE | Freq: Four times a day (QID) | ORAL | 0 refills | Status: DC

## 2018-06-22 MED ORDER — METHYLPREDNISOLONE ACETATE 80 MG/ML IJ SUSP
80.0000 mg | Freq: Once | INTRAMUSCULAR | Status: AC
  Administered 2018-06-22: 80 mg via INTRAMUSCULAR

## 2018-06-22 MED ORDER — LORATADINE 10 MG PO TABS: 10.0000 mg | ORAL_TABLET | Freq: Every day | ORAL | 11 refills | Status: DC

## 2018-06-22 NOTE — Progress Notes (Signed)
BP (!) 137/93   Pulse (!) 105   Temp (!) 97.5 F (36.4 C) (Oral)   Ht 6' (1.829 m)   Wt 211 lb 12.5 oz (96.1 kg)   BMI 28.72 kg/m    Subjective:    Patient ID: Jason Alexander, male    DOB: 1976-03-29, 42 y.o.   MRN: [id number]  HPI: Jason Alexander is a 42 y.o. male presenting on 06/22/2018 for Insect Bite (2 days ago )  This patient comes in having more than 30 yellowjacket staying was 2 days ago.  He was mowing in the got his legs and arms he was wearing shorts that are up onto his thighs.  One on his shoulder.  He had used Benadryl with some relief but is having some overall feelings of weakness, significant stinging and burning in the ones in the legs.  They do have some pus bumps on some of them.  He denies any fever or chills.  He has never had an allergy to bee stings in the past.  Past Medical History:  Diagnosis Date  . Depression 2006   after divorce   Relevant past medical, surgical, family and social history reviewed and updated as indicated. Interim medical history since our last visit reviewed. Allergies and medications reviewed and updated. DATA REVIEWED: CHART IN EPIC  Family History reviewed for pertinent findings.  Review of Systems  Constitutional: Positive for fatigue. Negative for appetite change, chills and fever.  Eyes: Negative for pain and visual disturbance.  Respiratory: Negative.  Negative for cough, chest tightness, shortness of breath and wheezing.   Cardiovascular: Negative.  Negative for chest pain, palpitations and leg swelling.  Gastrointestinal: Negative.  Negative for abdominal pain, diarrhea, nausea and vomiting.  Genitourinary: Negative.   Musculoskeletal: Positive for arthralgias and myalgias.  Skin: Positive for wound. Negative for color change and rash.  Neurological: Positive for weakness and numbness. Negative for dizziness, seizures, speech difficulty and headaches.  Psychiatric/Behavioral: Negative.     Allergies as of  06/22/2018      Reactions   Depakote [divalproex Sodium] Other (See Comments)   Aggression      Medication List        Accurate as of 06/22/18 10:32 AM. Always use your most recent med list.          cephALEXin 500 MG capsule Commonly known as:  KEFLEX Take 1 capsule (500 mg total) by mouth 4 (four) times daily.   loratadine 10 MG tablet Commonly known as:  CLARITIN Take 1 tablet (10 mg total) by mouth daily.   predniSONE 10 MG (48) Tbpk tablet Commonly known as:  STERAPRED UNI-PAK 48 TAB Take as directed, 12 days   traZODone 150 MG tablet Commonly known as:  DESYREL Take 50-150 mg by mouth at bedtime.          Objective:    BP (!) 137/93   Pulse (!) 105   Temp (!) 97.5 F (36.4 C) (Oral)   Ht 6' (1.829 m)   Wt 211 lb 12.5 oz (96.1 kg)   BMI 28.72 kg/m   Allergies  Allergen Reactions  . Depakote [Divalproex Sodium] Other (See Comments)    Aggression      Wt Readings from Last 3 Encounters:  06/22/18 211 lb 12.5 oz (96.1 kg)  09/05/17 215 lb (97.5 kg)  02/24/17 215 lb (97.5 kg)    Physical Exam  Constitutional: He appears well-developed and well-nourished. No distress.  HENT:  Head: Normocephalic and  atraumatic.  Eyes: Pupils are equal, round, and reactive to light. Conjunctivae and EOM are normal.  Cardiovascular: Normal rate, regular rhythm and normal heart sounds.  Pulmonary/Chest: Effort normal and breath sounds normal. No respiratory distress.  Skin: Skin is warm and dry.  Multiple stings on the leg few with pus bumps others are just red with slight redness surrounding them.  Psychiatric: He has a normal mood and affect. His behavior is normal.  Nursing note and vitals reviewed.     Assessment & Plan:   1. Allergic reaction to bee sting - predniSONE (STERAPRED UNI-PAK 48 TAB) 10 MG (48) TBPK tablet; Take as directed, 12 days  Dispense: 48 tablet; Refill: 0 - cephALEXin (KEFLEX) 500 MG capsule; Take 1 capsule (500 mg total) by mouth 4 (four)  times daily.  Dispense: 40 capsule; Refill: 0 - loratadine (CLARITIN) 10 MG tablet; Take 1 tablet (10 mg total) by mouth daily.  Dispense: 30 tablet; Refill: 11 - diphenhydrAMINE (BENADRYL) injection 50 mg - methylPREDNISolone acetate (DEPO-MEDROL) injection 80 mg    Continue all other maintenance medications as listed above.  Follow up plan: No follow-ups on file.  Educational handout given for survey  Remus LofflerAngel S. Grove Defina PA-C [facility] [address]  [place], [place] [address] [phone]   06/22/2018, 10:32 AM

## 2018-06-22 NOTE — Patient Instructions (Signed)
claritin 10mg  daily Prednisone pack Benadryl 50 mg 4 times a day

## 2019-06-08 ENCOUNTER — Encounter: Payer: Medicaid Other | Admitting: Family Medicine

## 2019-07-18 ENCOUNTER — Encounter: Payer: Medicaid Other | Admitting: Family Medicine

## 2019-08-14 ENCOUNTER — Ambulatory Visit (INDEPENDENT_AMBULATORY_CARE_PROVIDER_SITE_OTHER): Payer: Medicaid Other | Admitting: Family Medicine

## 2019-08-14 ENCOUNTER — Encounter: Payer: Self-pay | Admitting: Family Medicine

## 2019-08-14 ENCOUNTER — Other Ambulatory Visit: Payer: Self-pay

## 2019-08-14 VITALS — BP 123/76 | HR 79 | Temp 96.8°F | Ht 73.0 in | Wt 220.0 lb

## 2019-08-14 DIAGNOSIS — G5603 Carpal tunnel syndrome, bilateral upper limbs: Secondary | ICD-10-CM

## 2019-08-14 DIAGNOSIS — R5383 Other fatigue: Secondary | ICD-10-CM

## 2019-08-14 DIAGNOSIS — G5711 Meralgia paresthetica, right lower limb: Secondary | ICD-10-CM

## 2019-08-14 DIAGNOSIS — Z0000 Encounter for general adult medical examination without abnormal findings: Secondary | ICD-10-CM

## 2019-08-14 DIAGNOSIS — N529 Male erectile dysfunction, unspecified: Secondary | ICD-10-CM

## 2019-08-14 MED ORDER — TRAZODONE HCL 150 MG PO TABS: ORAL_TABLET | ORAL | 5 refills | Status: DC

## 2019-08-14 NOTE — Progress Notes (Signed)
Subjective:  Patient ID: Jason Alexander, male    DOB: 03/15/1976  Age: 43 y.o. MRN: [id number]  CC: Annual Exam, Edema (bilateral legs and hands- patient states it has been coming and going), and Numbness (bilateral hands x 6 months)   HPI Jason Alexander presents for CPE. Concerned about weakness, pain and numbness in hands, right more than left. Notes loss of muscle mass at right thenar eminence. 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 Traazodone made him feel like a zombie, but Jason Alexander never titrated dose. Took the whole pill from the beginning. States Jason Alexander still is not sleeping well.   Depression screen [facility] 2/9 08/14/2019 06/22/2018 11/08/2016  Decreased Interest 0 0 2  Down, Depressed, Hopeless 0 1 1  PHQ - 2 Score 0 1 3  Altered sleeping - - 1  Tired, decreased energy - - 3  Change in appetite - - 1  Feeling bad or failure about yourself  - - 2  Trouble concentrating - - 3  Moving slowly or fidgety/restless - - 3  Suicidal thoughts - - 0  PHQ-9 Score - - 16  Difficult doing work/chores - - -    History Jason Alexander has a past medical history of Depression (2006).   Jason Alexander has a past surgical history that includes Nasal sinus surgery and Appendectomy.   His family history includes ADD / ADHD in his mother; Alcohol abuse in his mother; COPD in his mother; Drug abuse in his mother; HIV in his mother; Heart attack in his mother; Heart disease in his mother; Hepatitis C in his mother; Hypertension in his sister.Jason Alexander reports that Jason Alexander has been smoking cigarettes. Jason Alexander has a 40.00 pack-year smoking history. Jason Alexander quit smokeless tobacco use about 8 years ago. Jason Alexander reports current drug use. Drug: Marijuana. Jason Alexander reports that Jason Alexander does not drink alcohol.    ROS Review of Systems  Constitutional: Positive for fatigue. Negative for activity change and unexpected weight change.  HENT: Negative for congestion, ear pain, hearing loss, postnasal drip and trouble swallowing.   Eyes: Negative for pain and visual disturbance.   Respiratory: Negative for cough, chest tightness and shortness of breath.   Cardiovascular: Negative for chest pain, palpitations and leg swelling.  Gastrointestinal: Negative for abdominal distention, abdominal pain, blood in stool, constipation, diarrhea, nausea and vomiting.  Endocrine: Negative for cold intolerance, heat intolerance and polydipsia.  Genitourinary: Negative for difficulty urinating, dysuria, flank pain, frequency and urgency.       PArtial erection frequently.    Musculoskeletal: Positive for back pain (chronic, work as a Theme park manager). Negative for arthralgias and joint swelling.  Skin: Negative for color change, rash and wound.  Neurological: Negative for dizziness, syncope, speech difficulty, weakness, light-headedness and headaches. Numbness: right thigh, anteriorly increasing for several months.  Hematological: Does not bruise/bleed easily.  Psychiatric/Behavioral: Negative for confusion, decreased concentration, dysphoric mood and sleep disturbance. The patient is not nervous/anxious.     Objective:  BP 123/76   Pulse 79   Temp (!) 96.8 F (36 C) (Temporal)   Ht _0  (1.854 m)   Wt 220 lb (99.8 kg)   BMI 29.03 kg/m   BP Readings from Last 3 Encounters:  08/14/19 123/76  06/22/18 (!) 137/93  09/06/17 117/73    Wt Readings from Last 3 Encounters:  08/14/19 220 lb (99.8 kg)  06/22/18 211 lb 12.5 oz (96.1 kg)  09/05/17 215 lb (97.5 kg)     Physical Exam Constitutional:      Appearance:  Jason Alexander is well-developed.  HENT:     Head: Normocephalic and atraumatic.  Eyes:     Pupils: Pupils are equal, round, and reactive to light.  Neck:     Musculoskeletal: Normal range of motion.     Thyroid: No thyromegaly.     Trachea: No tracheal deviation.  Cardiovascular:     Rate and Rhythm: Normal rate and regular rhythm.     Heart sounds: Normal heart sounds. No murmur. No friction rub. No gallop.   Pulmonary:     Breath sounds: Normal breath sounds. No wheezing or  rales.  Abdominal:     General: Bowel sounds are normal. There is no distension.     Palpations: Abdomen is soft. There is no mass.     Tenderness: There is no abdominal tenderness.     Hernia: There is no hernia in the left inguinal area or right inguinal area.  Genitourinary:    Penis: Normal. No tenderness.      Scrotum/Testes: Normal.  Musculoskeletal: Normal range of motion.        General: Deformity (wasting of right thenar eminenince with decreased tone.) present.  Lymphadenopathy:     Cervical: No cervical adenopathy.  Skin:    General: Skin is warm and dry.  Neurological:     Mental Status: Jason Alexander is alert and oriented to person, place, and time.     Cranial Nerves: No cranial nerve deficit.     Motor: No abnormal muscle tone.     Deep Tendon Reflexes: Reflexes are normal and symmetric. Reflexes normal.       Assessment & Plan:   Jason Alexander was seen today for annual exam, edema and numbness.  Diagnoses and all orders for this visit:  Annual physical exam -     CBC with Differential/Platelet -     CMP14+EGFR -     Lipid panel -     Urinalysis, Complete -     TSH -     Vitamin B12 -     Folate  Fatigue, unspecified type -     Testosterone,Free and Total -     Vitamin B12 -     Folate  Erectile dysfunction, unspecified erectile dysfunction type -     Testosterone,Free and Total -     VITAMIN D 25 Hydroxy (Vit-D Deficiency, Fractures)  Bilateral carpal tunnel syndrome -     Nerve conduction test; Future  Meralgia paresthetica of right side -     Nerve conduction test; Future  Other orders -     traZODone (DESYREL) 150 MG tablet; Use from 1/3 to 1 tablet nightly as needed for sleep.       I have discontinued [name]'s traZODone, predniSONE, and cephALEXin. I am also having him start on traZODone. Additionally, I am having him maintain his loratadine.  Allergies as of 08/14/2019      Reactions   Depakote [divalproex Sodium] Other (See Comments)    Aggression      Medication List       Accurate as of August 14, 2019  9:29 PM. If you have any questions, ask your nurse or doctor.        STOP taking these medications   cephALEXin 500 MG capsule Commonly known as: KEFLEX Stopped by: Claretta Fraise, MD   predniSONE 10 MG (48) Tbpk tablet Commonly known as: STERAPRED UNI-PAK 48 TAB Stopped by: Claretta Fraise, MD     TAKE these medications   loratadine 10 MG tablet Commonly known  as: CLARITIN Take 1 tablet (10 mg total) by mouth daily.   traZODone 150 MG tablet Commonly known as: DESYREL Use from 1/3 to 1 tablet nightly as needed for sleep. What changed:   how much to take  how to take this  when to take this  additional instructions Changed by: Claretta Fraise, MD        Follow-up: No follow-ups on file.  Claretta Fraise, M.D.

## 2019-08-14 NOTE — Patient Instructions (Signed)

## 2019-08-15 ENCOUNTER — Other Ambulatory Visit: Payer: Self-pay | Admitting: Family Medicine

## 2019-08-15 DIAGNOSIS — E291 Testicular hypofunction: Secondary | ICD-10-CM | POA: Insufficient documentation

## 2019-08-15 LAB — CBC WITH DIFFERENTIAL/PLATELET
Basophils Absolute: 0 10*3/uL (ref 0.0–0.2)
Basos: 0 %
EOS (ABSOLUTE): 0.2 10*3/uL (ref 0.0–0.4)
Eos: 3 %
Hematocrit: 40.1 % (ref 37.5–51.0)
Hemoglobin: 13.6 g/dL (ref 13.0–17.7)
Immature Grans (Abs): 0 10*3/uL (ref 0.0–0.1)
Immature Granulocytes: 0 %
Lymphocytes Absolute: 1.9 10*3/uL (ref 0.7–3.1)
Lymphs: 31 %
MCH: 30.8 pg (ref 26.6–33.0)
MCHC: 33.9 g/dL (ref 31.5–35.7)
MCV: 91 fL (ref 79–97)
Monocytes Absolute: 0.6 10*3/uL (ref 0.1–0.9)
Monocytes: 10 %
Neutrophils Absolute: 3.4 10*3/uL (ref 1.4–7.0)
Neutrophils: 56 %
Platelets: 320 10*3/uL (ref 150–450)
RBC: 4.42 x10E6/uL (ref 4.14–5.80)
RDW: 13.2 % (ref 11.6–15.4)
WBC: 6.1 10*3/uL (ref 3.4–10.8)

## 2019-08-15 LAB — TSH: TSH: 0.963 u[IU]/mL (ref 0.450–4.500)

## 2019-08-15 LAB — VITAMIN D 25 HYDROXY (VIT D DEFICIENCY, FRACTURES): Vit D, 25-Hydroxy: 31.5 ng/mL (ref 30.0–100.0)

## 2019-08-15 LAB — CMP14+EGFR
ALT: 12 IU/L (ref 0–44)
AST: 16 IU/L (ref 0–40)
Albumin/Globulin Ratio: 1.7 (ref 1.2–2.2)
Albumin: 4.4 g/dL (ref 4.0–5.0)
Alkaline Phosphatase: 69 IU/L (ref 39–117)
BUN/Creatinine Ratio: 14 (ref 9–20)
BUN: 19 mg/dL (ref 6–24)
Bilirubin Total: 0.3 mg/dL (ref 0.0–1.2)
CO2: 25 mmol/L (ref 20–29)
Calcium: 9.5 mg/dL (ref 8.7–10.2)
Chloride: 101 mmol/L (ref 96–106)
Creatinine, Ser: 1.33 mg/dL — ABNORMAL HIGH (ref 0.76–1.27)
GFR calc Af Amer: 77 mL/min/{1.73_m2} (ref >59)
GFR calc non Af Amer: 66 mL/min/{1.73_m2} (ref >59)
Globulin, Total: 2.6 g/dL (ref 1.5–4.5)
Glucose: 80 mg/dL (ref 65–99)
Potassium: 4.1 mmol/L (ref 3.5–5.2)
Sodium: 139 mmol/L (ref 134–144)
Total Protein: 7 g/dL (ref 6.0–8.5)

## 2019-08-15 LAB — FOLATE: Folate: 11.6 ng/mL (ref >3.0)

## 2019-08-15 LAB — LIPID PANEL
Chol/HDL Ratio: 4.5 ratio (ref 0.0–5.0)
Cholesterol, Total: 244 mg/dL — ABNORMAL HIGH (ref 100–199)
HDL: 54 mg/dL (ref >39)
LDL Chol Calc (NIH): 166 mg/dL — ABNORMAL HIGH (ref 0–99)
Triglycerides: 134 mg/dL (ref 0–149)
VLDL Cholesterol Cal: 24 mg/dL (ref 5–40)

## 2019-08-15 LAB — TESTOSTERONE,FREE AND TOTAL
Testosterone, Free: 5 pg/mL — ABNORMAL LOW (ref 6.8–21.5)
Testosterone: 200 ng/dL — ABNORMAL LOW (ref 264–916)

## 2019-08-15 LAB — VITAMIN B12: Vitamin B-12: 430 pg/mL (ref 232–1245)

## 2019-08-17 ENCOUNTER — Encounter: Payer: Self-pay | Admitting: *Deleted

## 2019-08-21 ENCOUNTER — Other Ambulatory Visit: Payer: Self-pay

## 2019-08-21 ENCOUNTER — Other Ambulatory Visit: Payer: Medicaid Other

## 2019-08-21 DIAGNOSIS — E291 Testicular hypofunction: Secondary | ICD-10-CM

## 2019-08-23 LAB — TESTOSTERONE,FREE AND TOTAL
Testosterone, Free: 5.5 pg/mL — ABNORMAL LOW (ref 6.8–21.5)
Testosterone: 125 ng/dL — ABNORMAL LOW (ref 264–916)

## 2019-09-18 ENCOUNTER — Ambulatory Visit (INDEPENDENT_AMBULATORY_CARE_PROVIDER_SITE_OTHER): Payer: Medicaid Other | Admitting: Family Medicine

## 2019-09-18 ENCOUNTER — Encounter: Payer: Self-pay | Admitting: Family Medicine

## 2019-09-18 ENCOUNTER — Other Ambulatory Visit: Payer: Self-pay

## 2019-09-18 ENCOUNTER — Telehealth: Payer: Self-pay | Admitting: Family Medicine

## 2019-09-18 VITALS — BP 134/85 | HR 90 | Temp 96.6°F | Ht 73.0 in | Wt 212.0 lb

## 2019-09-18 DIAGNOSIS — R4182 Altered mental status, unspecified: Secondary | ICD-10-CM

## 2019-09-18 DIAGNOSIS — R202 Paresthesia of skin: Secondary | ICD-10-CM

## 2019-09-18 DIAGNOSIS — F411 Generalized anxiety disorder: Secondary | ICD-10-CM

## 2019-09-18 DIAGNOSIS — M2550 Pain in unspecified joint: Secondary | ICD-10-CM

## 2019-09-18 DIAGNOSIS — G3281 Cerebellar ataxia in diseases classified elsewhere: Secondary | ICD-10-CM

## 2019-09-18 NOTE — Telephone Encounter (Signed)
Returned wife's call.

## 2019-09-18 NOTE — Progress Notes (Signed)
Subjective:  Patient ID: Jason Alexander, male    DOB: 1976-01-31  Age: 43 y.o. MRN: [id number]  CC: 1 month followup (tingling and numbness in bilateral hands. Patient states it has gotten worse)   HPI Jason Alexander presents for one-month follow-up on his multiple neurologic deficits.  When he was in before he seemed to have some carpal tunnel and meralgia paresthetica symptoms.  However that is continued to spread.  Additionally his arms and legs and face are having twitching.  He is having stiffness and pain in his neck and jaw.  He is having a lot of floating spots that he describes as glitter bombs in his eyes.  He is having buzzing in his left ear.  He is having pain in both ears.  He gets diarrhea intermittently.  He says that his wife walked in during a suicide attempt secondary to his pain.  He tells me that his thenar eminence bilaterally is completely not there is totally numb.  He brought in some papers with notes in it including a comment MI crazy.  He has been extra angry and Dextrostat.  He is having speech difficulty and slurred he is having word finding he can get his thoughts out.  He cannot explain what he is thinking.  He is having trouble finding commonly used words.  His speech is timbre stammering.  He is having short-term and long-term memory loss.  He can no longer recite his ABCs.  He says he has had sudden outbreaks of hives in his upper body as well.  Depression screen [facility] 2/9 09/18/2019 08/14/2019 06/22/2018  Decreased Interest 0 0 0  Down, Depressed, Hopeless 1 0 1  PHQ - 2 Score 1 0 1  Altered sleeping - - -  Tired, decreased energy - - -  Change in appetite - - -  Feeling bad or failure about yourself  - - -  Trouble concentrating - - -  Moving slowly or fidgety/restless - - -  Suicidal thoughts - - -  PHQ-9 Score - - -  Difficult doing work/chores - - -    History Jason Alexander has a past medical history of Depression (2006).   He has a past surgical history that  includes Nasal sinus surgery and Appendectomy.   His family history includes ADD / ADHD in his mother; Alcohol abuse in his mother; COPD in his mother; Drug abuse in his mother; HIV in his mother; Heart attack in his mother; Heart disease in his mother; Hepatitis C in his mother; Hypertension in his sister.He reports that he has been smoking cigarettes. He has a 40.00 pack-year smoking history. He quit smokeless tobacco use about 8 years ago. He reports current drug use. Drug: Marijuana. He reports that he does not drink alcohol.    ROS Review of Systems  Constitutional: Positive for fatigue (extreme).  HENT: Negative.   Eyes: Negative for visual disturbance.  Respiratory: Negative for cough and shortness of breath.   Cardiovascular: Negative for chest pain and leg swelling.  Gastrointestinal: Negative for abdominal pain, diarrhea, nausea and vomiting.  Genitourinary: Negative for difficulty urinating.  Musculoskeletal: Negative for arthralgias and myalgias.  Skin: Negative for rash.  Neurological: Positive for numbness (hands, legs). Negative for headaches.  Psychiatric/Behavioral: Positive for agitation and decreased concentration. Negative for sleep disturbance.    Objective:  BP 134/85   Pulse 90   Temp (!) 96.6 F (35.9 C) (Temporal)   Ht '6\' 1"'  (1.854 m)   Wt 212 lb (  96.2 kg)   SpO2 98%   BMI 27.97 kg/m   BP Readings from Last 3 Encounters:  09/18/19 134/85  08/14/19 123/76  06/22/18 (!) 137/93    Wt Readings from Last 3 Encounters:  09/18/19 212 lb (96.2 kg)  08/14/19 220 lb (99.8 kg)  06/22/18 211 lb 12.5 oz (96.1 kg)     Physical Exam Constitutional:      General: He is not in acute distress.    Appearance: He is well-developed.  HENT:     Head: Normocephalic and atraumatic.     Right Ear: External ear normal.     Left Ear: External ear normal.     Nose: Nose normal.  Eyes:     Conjunctiva/sclera: Conjunctivae normal.     Pupils: Pupils are equal,  round, and reactive to light.  Neck:     Musculoskeletal: Normal range of motion and neck supple.  Cardiovascular:     Rate and Rhythm: Normal rate and regular rhythm.     Heart sounds: Normal heart sounds. No murmur.  Pulmonary:     Effort: Pulmonary effort is normal. No respiratory distress.     Breath sounds: Normal breath sounds. No wheezing or rales.  Abdominal:     Palpations: Abdomen is soft.     Tenderness: There is no abdominal tenderness.  Musculoskeletal: Normal range of motion.  Skin:    General: Skin is warm and dry.  Neurological:     Mental Status: He is alert and oriented to person, place, and time.     Sensory: Sensory deficit present.     Motor: Weakness and tremor present.     Coordination: Coordination abnormal. Impaired rapid alternating movements.     Deep Tendon Reflexes: Reflexes are normal and symmetric.  Psychiatric:        Attention and Perception: Attention normal.        Mood and Affect: Mood is anxious. Affect is labile.        Speech: Speech is rapid and pressured.        Behavior: Behavior is agitated.        Thought Content: Thought content normal.        Cognition and Memory: Cognition is impaired.        Judgment: Judgment normal.       Assessment & Plan:   Jason Alexander was seen today for 1 month followup.  Diagnoses and all orders for this visit:  Paresthesias -     Ambulatory referral to Neurology -     CMP14+EGFR -     CBC with Differential/Platelet -     Lyme Ab/Western Blot Reflex -     RPR -     Sedimentation rate -     CYCLIC CITRUL PEPTIDE ANTIBODY, IGG/IGA -     Anti-DNA antibody, double-stranded -     Magnesium -     Phosphorus -     Vitamin B12 -     VITAMIN D 25 Hydroxy (Vit-D Deficiency, Fractures) -     HIV antibody (with reflex)  Cerebellar ataxia in diseases classified elsewhere ([place]) -     MR Brain W Wo Contrast; Future -     Ambulatory referral to Neurology -     CMP14+EGFR -     CBC with Differential/Platelet  -     Lyme Ab/Western Blot Reflex -     RPR -     Sedimentation rate -     CYCLIC CITRUL PEPTIDE ANTIBODY, IGG/IGA -  Anti-DNA antibody, double-stranded -     VITAMIN D 25 Hydroxy (Vit-D Deficiency, Fractures) -     HIV antibody (with reflex)  Anxiety state -     Ambulatory referral to Neurology -     CMP14+EGFR -     CBC with Differential/Platelet -     Sedimentation rate -     VITAMIN D 25 Hydroxy (Vit-D Deficiency, Fractures) -     HIV antibody (with reflex)  Fluctuating mental status -     Ambulatory referral to Neurology -     CMP14+EGFR -     CBC with Differential/Platelet -     Sedimentation rate -     CYCLIC CITRUL PEPTIDE ANTIBODY, IGG/IGA -     Anti-DNA antibody, double-stranded -     VITAMIN D 25 Hydroxy (Vit-D Deficiency, Fractures) -     HIV antibody (with reflex)  Arthralgia, unspecified joint -     VITAMIN D 25 Hydroxy (Vit-D Deficiency, Fractures) -     HIV antibody (with reflex)       I am having Jason Alexander maintain his loratadine and traZODone.  Allergies as of 09/18/2019      Reactions   Depakote [divalproex Sodium] Other (See Comments)   Aggression      Medication List       Accurate as of September 18, 2019  2:40 PM. If you have any questions, ask your nurse or doctor.        loratadine 10 MG tablet Commonly known as: CLARITIN Take 1 tablet (10 mg total) by mouth daily.   traZODone 150 MG tablet Commonly known as: DESYREL Use from 1/3 to 1 tablet nightly as needed for sleep.        Follow-up: Return in about 2 weeks (around 10/02/2019).  Claretta Fraise, M.D.

## 2019-09-25 ENCOUNTER — Ambulatory Visit (HOSPITAL_COMMUNITY)
Admission: RE | Admit: 2019-09-25 | Discharge: 2019-09-25 | Disposition: A | Discharge status: Home / Self Care | Payer: Medicaid Other | Source: Ambulatory Visit | Attending: Family Medicine | Admitting: Family Medicine

## 2019-09-25 ENCOUNTER — Telehealth: Payer: Self-pay | Admitting: Family Medicine

## 2019-09-25 ENCOUNTER — Encounter (HOSPITAL_COMMUNITY): Payer: Self-pay

## 2019-09-25 ENCOUNTER — Other Ambulatory Visit: Payer: Self-pay | Admitting: Family Medicine

## 2019-09-25 ENCOUNTER — Other Ambulatory Visit: Payer: Self-pay

## 2019-09-25 DIAGNOSIS — T1591XA Foreign body on external eye, part unspecified, right eye, initial encounter: Secondary | ICD-10-CM

## 2019-09-25 DIAGNOSIS — G3281 Cerebellar ataxia in diseases classified elsewhere: Secondary | ICD-10-CM

## 2019-09-25 NOTE — Telephone Encounter (Signed)
Will do!

## 2019-09-27 ENCOUNTER — Telehealth: Payer: Self-pay | Admitting: Family Medicine

## 2019-09-28 ENCOUNTER — Telehealth: Payer: Self-pay | Admitting: Family Medicine

## 2019-10-16 NOTE — Progress Notes (Deleted)
NEUROLOGY CONSULTATION NOTE  Jason Alexander MRN: [id number] DOB: 1976/06/11  Referring provider: Mechele Claude, MD Primary care provider: Mechele Claude, MD  Reason for consult:  Ataxia, fluctuating mental status  HISTORY OF PRESENT ILLNESS: Jason Alexander is a 43 year old ***-handed Caucasian male who presents for multiple neurologic symptomatology, including ataxia, weakness, pain, paresthesias and fluctuating mental status.  History supplemented by referring provider notes.  Since early 2020, he noticed pain, numbness and weakness in his hands, right worse than left.  ***.  He also noted some loss of muscle mass involving the right thenar eminence.  He works as a Designer, fashion/clothing and uses a Psychologist, clinical frequently.  ***.  He also noted numbness involving the right thigh.  He followed up with his PCP in September who suspected carpal tunnel syndrome and right meralgia paresthetica and NCV-EMG was ordered.  Symptoms subsequently progressed.  He notes fatigue.  He endorsed twitching involving his face, arms and legs.  He reports stiffness and pain involving his neck and jaw as well as bilateral otalgia.  He has endorsed continued weakness and numbness of his hands, as well as progressed atrophy of both thenar eminence.  He is reporting slurred speech and word finding difficulty.  He reportedly attempted suicide due to the pain.  He has been experiencing tremors.  He also reports outbreak of hives in his upper body as well as diarrhea.  He has been more angry and agitated  He reportedly attempted suicide due to the pain.    Labs: 08/14/2019: CBC normal; CMP unremarkable except for mildly elevated CR 1.33; TSH 0.963; testosterone 200; vitamin D 31.5; B12 430; 11.6;  PAST MEDICAL HISTORY: Past Medical History:  Diagnosis Date  . Depression 2006   after divorce    PAST SURGICAL HISTORY: Past Surgical History:  Procedure Laterality Date  . APPENDECTOMY    . NASAL SINUS SURGERY     polyp removal     MEDICATIONS: Current Outpatient Medications on File Prior to Visit  Medication Sig Dispense Refill  . loratadine (CLARITIN) 10 MG tablet Take 1 tablet (10 mg total) by mouth daily. 30 tablet 11  . traZODone (DESYREL) 150 MG tablet Use from 1/3 to 1 tablet nightly as needed for sleep. 30 tablet 5   No current facility-administered medications on file prior to visit.     ALLERGIES: Allergies  Allergen Reactions  . Depakote [Divalproex Sodium] Other (See Comments)    Aggression      FAMILY HISTORY: Family History  Problem Relation Age of Onset  . ADD / ADHD Mother   . Hepatitis C Mother   . HIV Mother   . COPD Mother   . Alcohol abuse Mother   . Drug abuse Mother   . Heart disease Mother   . Heart attack Mother   . Hypertension Sister    ***.  SOCIAL HISTORY: Social History   Socioeconomic History  . Marital status: Married    Spouse name: Not on file  . Number of children: Not on file  . Years of education: Not on file  . Highest education level: Not on file  Occupational History  . Not on file  Social Needs  . Financial resource strain: Not on file  . Food insecurity    Worry: Not on file    Inability: Not on file  . Transportation needs    Medical: Not on file    Non-medical: Not on file  Tobacco Use  . Smoking status: Current Every Day  Smoker    Packs/day: 2.00    Years: 20.00    Pack years: 40.00    Types: Cigarettes  . Smokeless tobacco: Former Systems developer    Quit date: 01/06/2011  Substance and Sexual Activity  . Alcohol use: No  . Drug use: Yes    Types: Marijuana    Comment: doesn't smoke, edibles  . Sexual activity: Yes    Comment: same partner male partner for 10 years, 2 kids  Lifestyle  . Physical activity    Days per week: Not on file    Minutes per session: Not on file  . Stress: Not on file  Relationships  . Social Herbalist on phone: Not on file    Gets together: Not on file    Attends religious service: Not on file     Active member of club or organization: Not on file    Attends meetings of clubs or organizations: Not on file    Relationship status: Not on file  . Intimate partner violence    Fear of current or ex partner: Not on file    Emotionally abused: Not on file    Physically abused: Not on file    Forced sexual activity: Not on file  Other Topics Concern  . Not on file  Social History Narrative  . Not on file    REVIEW OF SYSTEMS: Constitutional: No fevers, chills, or sweats, no generalized fatigue, change in appetite Eyes: No visual changes, double vision, eye pain Ear, nose and throat: No hearing loss, ear pain, nasal congestion, sore throat Cardiovascular: No chest pain, palpitations Respiratory:  No shortness of breath at rest or with exertion, wheezes GastrointestinaI: No nausea, vomiting, diarrhea, abdominal pain, fecal incontinence Genitourinary:  No dysuria, urinary retention or frequency Musculoskeletal:  No neck pain, back pain Integumentary: No rash, pruritus, skin lesions Neurological: as above Psychiatric: No depression, insomnia, anxiety Endocrine: No palpitations, fatigue, diaphoresis, mood swings, change in appetite, change in weight, increased thirst Hematologic/Lymphatic:  No purpura, petechiae. Allergic/Immunologic: no itchy/runny eyes, nasal congestion, recent allergic reactions, rashes  PHYSICAL EXAM: *** General: No acute distress.  Patient appears ***-groomed.  *** Head:  Normocephalic/atraumatic Eyes:  fundi examined but not visualized Neck: supple, no paraspinal tenderness, full range of motion Back: No paraspinal tenderness Heart: regular rate and rhythm Lungs: Clear to auscultation bilaterally. Vascular: No carotid bruits. Neurological Exam: Mental status: alert and oriented to person, place, and time, recent and remote memory intact, fund of knowledge intact, attention and concentration intact, speech fluent and not dysarthric, language intact. Cranial  nerves: CN I: not tested CN II: pupils equal, round and reactive to light, visual fields intact CN III, IV, VI:  full range of motion, no nystagmus, no ptosis CN V: facial sensation intact CN VII: upper and lower face symmetric CN VIII: hearing intact CN IX, X: gag intact, uvula midline CN XI: sternocleidomastoid and trapezius muscles intact CN XII: tongue midline Bulk & Tone: normal, no fasciculations. Motor:  5/5 throughout *** Sensation:  Pinprick *** temperature *** and vibration sensation intact.  ***. Deep Tendon Reflexes:  2+ throughout, *** toes downgoing.  *** Finger to nose testing:  Without dysmetria.  *** Heel to shin:  Without dysmetria.  *** Gait:  Normal station and stride.  Able to turn and tandem walk. Romberg ***.  IMPRESSION: ***  PLAN: ***  Thank you for allowing me to take part in the care of this patient.  Metta Clines, DO  CC: ***

## 2019-10-17 ENCOUNTER — Ambulatory Visit: Payer: Self-pay | Admitting: Neurology

## 2020-02-19 ENCOUNTER — Encounter (HOSPITAL_COMMUNITY): Payer: Self-pay | Admitting: Emergency Medicine

## 2020-02-19 ENCOUNTER — Other Ambulatory Visit: Payer: Self-pay

## 2020-02-19 ENCOUNTER — Emergency Department (HOSPITAL_COMMUNITY)
Admission: EM | Admit: 2020-02-19 | Discharge: 2020-02-19 | Disposition: A | Discharge status: Home / Self Care | Payer: Medicaid Other | Attending: Emergency Medicine | Admitting: Emergency Medicine

## 2020-02-19 ENCOUNTER — Emergency Department (HOSPITAL_COMMUNITY): Payer: Medicaid Other

## 2020-02-19 DIAGNOSIS — R0789 Other chest pain: Secondary | ICD-10-CM | POA: Insufficient documentation

## 2020-02-19 DIAGNOSIS — R0602 Shortness of breath: Secondary | ICD-10-CM | POA: Diagnosis not present

## 2020-02-19 DIAGNOSIS — F17210 Nicotine dependence, cigarettes, uncomplicated: Secondary | ICD-10-CM | POA: Diagnosis not present

## 2020-02-19 DIAGNOSIS — M79604 Pain in right leg: Secondary | ICD-10-CM | POA: Insufficient documentation

## 2020-02-19 DIAGNOSIS — R079 Chest pain, unspecified: Secondary | ICD-10-CM | POA: Diagnosis not present

## 2020-02-19 DIAGNOSIS — Z79899 Other long term (current) drug therapy: Secondary | ICD-10-CM | POA: Insufficient documentation

## 2020-02-19 DIAGNOSIS — M79661 Pain in right lower leg: Secondary | ICD-10-CM | POA: Diagnosis not present

## 2020-02-19 LAB — CBC WITH DIFFERENTIAL/PLATELET
Abs Immature Granulocytes: 0.01 10*3/uL (ref 0.00–0.07)
Basophils Absolute: 0 10*3/uL (ref 0.0–0.1)
Basophils Relative: 0 %
Eosinophils Absolute: 0.2 10*3/uL (ref 0.0–0.5)
Eosinophils Relative: 3 %
HCT: 41 % (ref 39.0–52.0)
Hemoglobin: 13.7 g/dL (ref 13.0–17.0)
Immature Granulocytes: 0 %
Lymphocytes Relative: 23 %
Lymphs Abs: 1.9 10*3/uL (ref 0.7–4.0)
MCH: 31.3 pg (ref 26.0–34.0)
MCHC: 33.4 g/dL (ref 30.0–36.0)
MCV: 93.6 fL (ref 80.0–100.0)
Monocytes Absolute: 0.8 10*3/uL (ref 0.1–1.0)
Monocytes Relative: 10 %
Neutro Abs: 5.1 10*3/uL (ref 1.7–7.7)
Neutrophils Relative %: 64 %
Platelets: 274 10*3/uL (ref 150–400)
RBC: 4.38 MIL/uL (ref 4.22–5.81)
RDW: 12.8 % (ref 11.5–15.5)
WBC: 8.1 10*3/uL (ref 4.0–10.5)
nRBC: 0 % (ref 0.0–0.2)

## 2020-02-19 LAB — BASIC METABOLIC PANEL
Anion gap: 10 (ref 5–15)
BUN: 26 mg/dL — ABNORMAL HIGH (ref 6–20)
CO2: 25 mmol/L (ref 22–32)
Calcium: 9.3 mg/dL (ref 8.9–10.3)
Chloride: 103 mmol/L (ref 98–111)
Creatinine, Ser: 1.12 mg/dL (ref 0.61–1.24)
GFR calc Af Amer: >60 mL/min (ref >60)
GFR calc non Af Amer: >60 mL/min (ref >60)
Glucose, Bld: 99 mg/dL (ref 70–99)
Potassium: 4.1 mmol/L (ref 3.5–5.1)
Sodium: 138 mmol/L (ref 135–145)

## 2020-02-19 LAB — D-DIMER, QUANTITATIVE: D-Dimer, Quant: 0.31 ug/mL-FEU (ref 0.00–0.50)

## 2020-02-19 LAB — TROPONIN I (HIGH SENSITIVITY): Troponin I (High Sensitivity): <2 ng/L (ref <18)

## 2020-02-19 MED ORDER — ENOXAPARIN SODIUM 100 MG/ML ~~LOC~~ SOLN
1.0000 mg/kg | Freq: Once | SUBCUTANEOUS | Status: AC
  Administered 2020-02-19: 100 mg via SUBCUTANEOUS
  Filled 2020-02-19: qty 1

## 2020-02-19 NOTE — ED Triage Notes (Addendum)
Pt c/o right leg pain intermittently over last two weeks. Pain is mostly behind the knee. Pcp asked pt to come to er to be evaluated. Pt states he had chest pain last night that has resolved now.

## 2020-02-19 NOTE — Discharge Instructions (Signed)
Your work-up today is reassuring.  Please call the phone number on your paperwork today to schedule your DVT ultrasound tomorrow.  You were given a dose of medication tonight to help protect you against blood clot while you wait for this ultrasound.  If your results are negative you will need to continue to follow-up with your primary doctor regarding the symptoms.

## 2020-02-19 NOTE — ED Provider Notes (Signed)
[facility] EMERGENCY DEPARTMENT Provider Note   CSN: [id number] Arrival date & time: 02/19/20  1930     History Chief Complaint  Patient presents with  . Leg Pain    Jason Alexander is a 44 y.o. male.  Jason Alexander is a 44 y.o. male with a history of depression and anxiety, who presents to the ED for evaluation of right lower leg pain.  He reports this pain was initially intermittent over the past 2 weeks.  Pain is located behind the knee and the top of his calf and has gotten worse recently.  He states the pain has become more constant, he looked at the back of his knee and noticed a blue curvy vein and wondered if this could be a blood clot.  He states he thinks he has had some swelling but he is really not sure.  No redness or warmth to the leg.  He has been able to walk and bear weight without difficulty.  Denies any numbness tingling or weakness.  He called his PCP about this pain and was directed to come to the emergency department to rule out a DVT.  Patient states that last night he started having some right-sided chest pain that started as a sharp pain in the right upper chest, it became more dull but has not gone away.  He reports that he has felt some intermittent shortness of breath.  He does state that he has been very anxious and wonders if that could be contributing.  He denies any radiation of chest pain.  No associated fevers or cough.  No lightheadedness or syncope.  No history of heart disease.  He is not currently on any blood thinners.  Has no history of blood clot or DVT.  No meds prior to arrival to treat symptoms.  No other aggravating or alleviating factors.        Past Medical History:  Diagnosis Date  . Depression 2006   after divorce    Patient Active Problem List   Diagnosis Date Noted  . Hypogonadism in male 08/15/2019  . Other male erectile dysfunction 11/08/2016  . Depression 10/05/2016  . Current every day smoker 10/05/2016  . Weight gain, abnormal  10/05/2016  . Generalized anxiety disorder 03/01/2016    Past Surgical History:  Procedure Laterality Date  . APPENDECTOMY    . NASAL SINUS SURGERY     polyp removal       Family History  Problem Relation Age of Onset  . ADD / ADHD Mother   . Hepatitis C Mother   . HIV Mother   . COPD Mother   . Alcohol abuse Mother   . Drug abuse Mother   . Heart disease Mother   . Heart attack Mother   . Hypertension Sister     Social History   Tobacco Use  . Smoking status: Current Every Day Smoker    Packs/day: 2.00    Years: 20.00    Pack years: 40.00    Types: Cigarettes  . Smokeless tobacco: Former Neurosurgeon    Quit date: 01/06/2011  Substance Use Topics  . Alcohol use: No  . Drug use: Yes    Types: Marijuana    Comment: doesn't smoke, edibles    Home Medications Prior to Admission medications   Medication Sig Start Date End Date Taking? Authorizing Provider  loratadine (CLARITIN) 10 MG tablet Take 1 tablet (10 mg total) by mouth daily. 06/22/18   Remus Loffler, PA-C  traZODone (  DESYREL) 150 MG tablet Use from 1/3 to 1 tablet nightly as needed for sleep. 08/14/19   Mechele Claude, MD    Allergies    Depakote [divalproex sodium]  Review of Systems   Review of Systems  Constitutional: Negative for chills and fever.  HENT: Negative.   Respiratory: Positive for shortness of breath. Negative for cough.   Cardiovascular: Positive for chest pain. Negative for leg swelling.  Musculoskeletal: Positive for myalgias. Negative for arthralgias and joint swelling.  Skin: Negative for color change and rash.  Neurological: Negative for dizziness, syncope and light-headedness.  All other systems reviewed and are negative.   Physical Exam Updated Vital Signs BP 139/86   Pulse 98   Temp 97.8 F (36.6 C)   Resp (!) 22   Ht 6' (1.829 m)   Wt 99.8 kg   SpO2 99%   BMI 29.84 kg/m   Physical Exam Vitals and nursing note reviewed.  Constitutional:      General: He is not in acute  distress.    Appearance: Normal appearance. He is well-developed and normal weight. He is not diaphoretic.     Comments: Patient appears very anxious but is otherwise well-appearing and in no distress  HENT:     Head: Normocephalic and atraumatic.  Eyes:     General:        Right eye: No discharge.        Left eye: No discharge.  Cardiovascular:     Rate and Rhythm: Normal rate and regular rhythm.     Pulses: Normal pulses.     Heart sounds: Normal heart sounds. No murmur. No friction rub. No gallop.   Pulmonary:     Effort: Pulmonary effort is normal. No respiratory distress.     Breath sounds: Normal breath sounds. No wheezing or rales.     Comments: Respirations equal and unlabored, patient able to speak in full sentences, lungs clear to auscultation bilaterally Abdominal:     General: Bowel sounds are normal. There is no distension.     Palpations: Abdomen is soft. There is no mass.     Tenderness: There is no abdominal tenderness. There is no guarding.     Comments: Abdomen soft, nondistended, nontender to palpation in all quadrants without guarding or peritoneal signs  Musculoskeletal:        General: No deformity.     Cervical back: Neck supple.     Comments: Mild tenderness behind the knee and in the upper calf of the left lower extremity without palpable deformity, no palpable cord, no overlying erythema or warmth, negative Homans' sign, distal pulses 2+, no edema noted.  Skin:    General: Skin is warm and dry.     Capillary Refill: Capillary refill takes less than 2 seconds.  Neurological:     Mental Status: He is alert.     Coordination: Coordination normal.     Comments: Speech is clear, able to follow commands Moves extremities without ataxia, coordination intact  Psychiatric:        Mood and Affect: Mood normal.        Behavior: Behavior normal.     ED Results / Procedures / Treatments   Labs (all labs ordered are listed, but only abnormal results are  displayed) Labs Reviewed  BASIC METABOLIC PANEL - Abnormal; Notable for the following components:      Result Value   BUN 26 (*)    All other components within normal limits  CBC WITH DIFFERENTIAL/PLATELET  D-DIMER, QUANTITATIVE (NOT AT [facility])  TROPONIN I (HIGH SENSITIVITY)    EKG None  Radiology DG Chest Port 1 View  Result Date: 02/19/2020 CLINICAL DATA:  Chest pain and shortness of breath. EXAM: PORTABLE CHEST 1 VIEW COMPARISON:  October 05, 2016 FINDINGS: There is no evidence of acute infiltrate, pleural effusion or pneumothorax. The heart size and mediastinal contours are within normal limits. The visualized skeletal structures are unremarkable. IMPRESSION: No active disease. Electronically Signed   By: Virgina Norfolk M.D.   On: 02/19/2020 20:18    Procedures Procedures (including critical care time)  Medications Ordered in ED Medications  enoxaparin (LOVENOX) injection 100 mg (has no administration in time range)    ED Course  I have reviewed the triage vital signs and the nursing notes.  Pertinent labs & imaging results that were available during my care of the patient were reviewed by me and considered in my medical decision making (see chart for details).    MDM Rules/Calculators/A&P                     44 year old male sent for evaluation of right lower leg pain with concern for possible DVT, he also experienced some chest pain that began last night with associated shortness of breath, no history of previous blood clots and no other risk factors, no tachypnea or tachycardia on arrival.  No history of cardiac disease.  Will check basic labs, troponin and D-dimer as well as chest x-ray.  Vascular ultrasound is not currently available if lab work is reassuring we will set patient up for venous ultrasound study tomorrow.  Patient's lab work is reassuring, no leukocytosis and normal hemoglobin, no acute electrolyte derangements, troponin is negative and pain has been  present since last night, do not think that delta troponin is indicated, EKG without concerning changes.  Chest x-ray is clear.  Patient's D-dimer is negative, do not feel that CTA is indicated, but this cannot truly rule out DVT so will give patient dose of Lovenox here in the ED tonight and then have him follow-up tomorrow for a vascular ultrasound.  I discussed this plan with the patient who expresses understanding and agreement.  Provided reassurance.  Discharged home in good condition.  Final Clinical Impression(s) / ED Diagnoses Final diagnoses:  Right leg pain  Chest pain, unspecified type  SOB (shortness of breath)    Rx / DC Orders ED Discharge Orders         Ordered    US Venous Img Lower Unilateral Right     02/19/20 2145           Jacqlyn Larsen, PA-C 02/19/20 2156    Lucrezia Starch, MD 02/20/20 [phone]

## 2020-02-21 ENCOUNTER — Ambulatory Visit: Payer: Medicaid Other | Admitting: Family Medicine

## 2020-02-21 ENCOUNTER — Other Ambulatory Visit: Payer: Self-pay

## 2020-02-21 ENCOUNTER — Ambulatory Visit (HOSPITAL_COMMUNITY)
Admission: RE | Admit: 2020-02-21 | Discharge: 2020-02-21 | Disposition: A | Discharge status: Home / Self Care | Payer: Medicaid Other | Source: Ambulatory Visit | Attending: Family Medicine | Admitting: Family Medicine

## 2020-02-21 ENCOUNTER — Encounter: Payer: Self-pay | Admitting: Family Medicine

## 2020-02-21 VITALS — BP 120/83 | HR 71 | Temp 97.8°F | Ht 72.0 in | Wt 233.6 lb

## 2020-02-21 DIAGNOSIS — M79604 Pain in right leg: Secondary | ICD-10-CM

## 2020-02-21 DIAGNOSIS — R600 Localized edema: Secondary | ICD-10-CM | POA: Diagnosis not present

## 2020-02-21 NOTE — Addendum Note (Signed)
Addended by: Adella Hare B on: 02/21/2020 10:24 AM   Modules accepted: Orders

## 2020-02-21 NOTE — Progress Notes (Addendum)
Subjective:  Patient ID: Jason Alexander, male    DOB: Apr 26, 1976  Age: 44 y.o. MRN: [id number]  CC: Follow-up (ER)   HPI Jason Alexander presents for follow-up from the emergency department for an episode of chest pain.  Jason Alexander had been having some dull ache on the right side of his chest near the costochondral margin for about a week.  This was replaced by a deep burning sensation in the same region for about 4 hours 2 days ago.  Jason Alexander went to the emergency room and labs ruled out for cardiac origin.  Jason Alexander had some shortness of breath at the time which is resolved.  The deep burning sensation resolved and has not returned there is some of the dull ache but it is not as bad now.  Of concern also during this time was some swelling at the left knee with moderately severe pain.  Pain has decreased but is still present.  Jason Alexander was advised for him by the ER to arrange for a right lower extremity Doppler.  That is to be arranged here today.  Depression screen [facility] 2/9 09/18/2019 08/14/2019 06/22/2018  Decreased Interest 0 0 0  Down, Depressed, Hopeless 1 0 1  PHQ - 2 Score 1 0 1  Altered sleeping - - -  Tired, decreased energy - - -  Change in appetite - - -  Feeling bad or failure about yourself  - - -  Trouble concentrating - - -  Moving slowly or fidgety/restless - - -  Suicidal thoughts - - -  PHQ-9 Score - - -  Difficult doing work/chores - - -    History Jason Alexander has a past medical history of Depression (2006).   Jason Alexander has a past surgical history that includes Nasal sinus surgery and Appendectomy.   His family history includes ADD / ADHD in his mother; Alcohol abuse in his mother; COPD in his mother; Drug abuse in his mother; HIV in his mother; Heart attack in his mother; Heart disease in his mother; Hepatitis C in his mother; Hypertension in his sister.Jason Alexander reports that Jason Alexander has been smoking cigarettes. Jason Alexander has a 40.00 pack-year smoking history. Jason Alexander quit smokeless tobacco use about 9 years ago. Jason Alexander reports  current drug use. Drug: Marijuana. Jason Alexander reports that Jason Alexander does not drink alcohol.    ROS Review of Systems  Constitutional: Negative for fever.  Respiratory: Positive for shortness of breath.   Cardiovascular: Negative for chest pain (See HPI).  Musculoskeletal: Positive for joint swelling (right knee) and myalgias (RLE). Negative for arthralgias.  Skin: Negative for rash.    Objective:  BP 120/83   Pulse 71   Temp 97.8 F (36.6 C)   Ht 6' (1.829 m)   Wt 233 lb 9.6 oz (106 kg)   SpO2 96%   BMI 31.68 kg/m   BP Readings from Last 3 Encounters:  02/21/20 120/83  02/19/20 (!) 119/94  09/18/19 134/85    Wt Readings from Last 3 Encounters:  02/21/20 233 lb 9.6 oz (106 kg)  02/19/20 220 lb (99.8 kg)  09/18/19 212 lb (96.2 kg)     Physical Exam Vitals reviewed.  Constitutional:      Appearance: Jason Alexander is well-developed.  HENT:     Head: Normocephalic and atraumatic.     Right Ear: External ear normal.     Left Ear: External ear normal.     Mouth/Throat:     Pharynx: No oropharyngeal exudate or posterior oropharyngeal erythema.  Eyes:  Pupils: Pupils are equal, round, and reactive to light.  Cardiovascular:     Rate and Rhythm: Normal rate and regular rhythm.     Heart sounds: No murmur.  Pulmonary:     Effort: No respiratory distress.     Breath sounds: Normal breath sounds.  Musculoskeletal:        General: Tenderness (right popliteal surface) present. Normal range of motion.     Cervical back: Normal range of motion and neck supple.  Neurological:     Mental Status: Jason Alexander is alert and oriented to person, place, and time.       Assessment & Plan:   Jason Alexander was seen today for follow-up.  Diagnoses and all orders for this visit:  Pain of right lower extremity -     Cancel: DOPPLER VENOUS LEGS BILATERAL; Future -     Cancel: DOPPLER VENOUS LEGS BILATERAL; Future -     Cancel: DOPPLER VENOUS LEGS BILATERAL; Future -     Cancel: DOPPLER VENOUS LEGS BILATERAL;  Future -     US Venous Img Lower Bilateral; Future       I have discontinued Jason Alexander's loratadine and traZODone.  Allergies as of 02/21/2020      Reactions   Depakote [divalproex Sodium] Other (See Comments)   Aggression      Medication List       Accurate as of February 21, 2020 10:31 AM. If you have any questions, ask your nurse or doctor.        STOP taking these medications   loratadine 10 MG tablet Commonly known as: CLARITIN Stopped by: Jason Claude, MD   traZODone 150 MG tablet Commonly known as: DESYREL Stopped by: Jason Claude, MD        Follow-up: No follow-ups on file.  Jason Alexander, M.D.

## 2020-03-18 ENCOUNTER — Ambulatory Visit: Payer: Medicaid Other | Admitting: Family Medicine

## 2020-03-27 ENCOUNTER — Encounter: Payer: Self-pay | Admitting: Family Medicine

## 2020-04-22 ENCOUNTER — Encounter: Payer: Self-pay | Admitting: Family Medicine

## 2020-04-22 ENCOUNTER — Other Ambulatory Visit: Payer: Self-pay

## 2020-04-22 ENCOUNTER — Ambulatory Visit: Payer: Medicaid Other | Admitting: Family Medicine

## 2020-04-22 VITALS — BP 132/84 | HR 77 | Temp 98.0°F | Ht 72.0 in | Wt 214.4 lb

## 2020-04-22 DIAGNOSIS — F1290 Cannabis use, unspecified, uncomplicated: Secondary | ICD-10-CM | POA: Diagnosis not present

## 2020-04-22 DIAGNOSIS — M5416 Radiculopathy, lumbar region: Secondary | ICD-10-CM

## 2020-04-22 DIAGNOSIS — R29898 Other symptoms and signs involving the musculoskeletal system: Secondary | ICD-10-CM

## 2020-04-22 DIAGNOSIS — R202 Paresthesia of skin: Secondary | ICD-10-CM

## 2020-04-22 DIAGNOSIS — R2689 Other abnormalities of gait and mobility: Secondary | ICD-10-CM | POA: Diagnosis not present

## 2020-04-22 MED ORDER — PREGABALIN 50 MG PO CAPS: ORAL_CAPSULE | ORAL | 0 refills | Status: DC

## 2020-04-22 NOTE — Progress Notes (Signed)
Subjective:  Patient ID: Jason Alexander, male    DOB: 11-20-1976  Age: 44 y.o. MRN: [id number]  CC: Nerve Pain (hands, legs and back )   HPI Jason Alexander presents for burning in the right leg and thigh with numbness in the right thigh noted for 8 months.  He gets tingling and hot pains he describes as phantom pain in the right lateral thigh.  His right calf has significant burning sensation as does the lateral thigh for compression.  Both of his hands are weak but the right is weaker than the left.  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  Patient also says that he loses his balance easily so he could not stand on the roof.  And he states that most of his symptoms are right-sided both upper and lower extremity.  However there is some left upper extremity pain and weakness in the left hand.  He is concerned about carpal tunnel bilaterally.  Additionally he says he is foggy minded and he does smoke marijuana on a regular basis.  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  So far he has had a battery of blood work that was negative and venous Dopplers negative for both lower extremities recently.  Depression screen [facility] 2/9 04/22/2020 09/18/2019 08/14/2019  Decreased Interest 0 0 0  Down, Depressed, Hopeless 0 1 0  PHQ - 2 Score 0 1 0  Altered sleeping - - -  Tired, decreased energy - - -  Change in appetite - - -  Feeling bad or failure about yourself  - - -  Trouble concentrating - - -  Moving slowly or fidgety/restless - - -  Suicidal thoughts - - -  PHQ-9 Score - - -  Difficult doing work/chores - - -    History Jason Alexander has a past medical history of Depression (2006).   He has a past surgical history that includes Nasal sinus surgery and Appendectomy.   His family history includes ADD / ADHD in his mother; Alcohol abuse in his mother; COPD in his mother; Cancer in his father; Drug abuse in his mother; HIV in his  mother; Heart attack in his mother; Heart disease in his mother; Hepatitis C in his mother; Hypertension in his sister.He reports that he has been smoking cigarettes. He has a 40.00 pack-year smoking history. He quit smokeless tobacco use about 9 years ago. He reports current drug use. Drug: Marijuana. He reports that he does not drink alcohol.    ROS Review of Systems  Constitutional: Negative for fever.  Respiratory: Negative for shortness of breath.   Cardiovascular: Negative for chest pain.  Musculoskeletal: Positive for arthralgias, back pain and myalgias.  Skin: Negative for rash.  Neurological: Positive for dizziness, weakness (RUE) and numbness.    Objective:  BP 132/84   Pulse 77   Temp 98 F (36.7 C) (Temporal)   Ht 6' (1.829 m)   Wt 214 lb 6.4 oz (97.3 kg)   BMI 29.08 kg/m   BP Readings from Last 3 Encounters:  04/22/20 132/84  02/21/20 120/83  02/19/20 (!) 119/94    Wt Readings from Last 3 Encounters:  04/22/20 214 lb 6.4 oz (97.3 kg)  02/21/20 233 lb 9.6 oz (106 kg)  02/19/20 220 lb (99.8 kg)     Physical Exam Vitals reviewed.  Constitutional:      Appearance: He is well-developed.  HENT:     Head: Normocephalic and atraumatic.     Right Ear: External ear normal.     Left Ear: External ear normal.     Mouth/Throat:     Pharynx: No oropharyngeal exudate or posterior oropharyngeal erythema.  Eyes:     Pupils: Pupils are equal, round, and reactive to light.  Cardiovascular:     Rate and Rhythm: Normal rate and regular rhythm.     Heart sounds: No murmur.  Pulmonary:     Effort: No respiratory distress.     Breath sounds: Normal breath sounds.  Musculoskeletal:        General: Tenderness (lateral right thigh. Decreased grip bilateral 3/5 strebgth with right weaker than left) present. Normal range of motion.     Cervical back: Normal range of motion and neck supple.  Neurological:     Mental Status: He is alert and oriented to person, place, and  time.       Assessment & Plan:   Jason Alexander was seen today for nerve pain.  Diagnoses and all orders for this visit:  Lumbar radiculopathy, right -     Ambulatory referral to Neurology -     MR LUMBAR SPINE WO CONTRAST; Future  Weakness of both hands -     Ambulatory referral to Neurology  Loss of balance -     Ambulatory referral to Neurology  Marijuana use  Paresthesias  Other orders -     pregabalin (Jason Alexander) 50 MG capsule; 1 qhs X7 days , then 2 qhs X 7d, then 3 qhs X 7d, then 4 qhs       I am having Jason Alexander start on pregabalin.  Allergies as of 04/22/2020      Reactions   Depakote [divalproex Sodium] Other (See Comments)   Aggression      Medication List       Accurate as of Apr 22, 2020  2:19 PM. If you have any questions, ask your nurse or doctor.        pregabalin 50 MG capsule Commonly known as: Jason Alexander 1 qhs X7 days , then 2 qhs X 7d, then 3 qhs X 7d, then 4 qhs Started by: Jason Claude, MD        Follow-up: Return in about 1 month (around 05/23/2020).  Jason Alexander, M.D.

## 2020-05-02 ENCOUNTER — Telehealth: Payer: Self-pay | Admitting: Family Medicine

## 2020-05-26 ENCOUNTER — Ambulatory Visit: Payer: Medicaid Other | Admitting: Family Medicine

## 2020-05-27 ENCOUNTER — Encounter: Payer: Self-pay | Admitting: Family Medicine

## 2020-06-05 DIAGNOSIS — Z419 Encounter for procedure for purposes other than remedying health state, unspecified: Secondary | ICD-10-CM | POA: Diagnosis not present

## 2020-06-17 ENCOUNTER — Encounter: Payer: Self-pay | Admitting: Diagnostic Neuroimaging

## 2020-06-17 ENCOUNTER — Ambulatory Visit: Payer: Medicaid Other | Admitting: Diagnostic Neuroimaging

## 2020-06-17 ENCOUNTER — Other Ambulatory Visit: Payer: Self-pay

## 2020-06-17 VITALS — BP 124/78 | HR 85 | Ht 72.0 in | Wt 216.2 lb

## 2020-06-17 DIAGNOSIS — M542 Cervicalgia: Secondary | ICD-10-CM

## 2020-06-17 DIAGNOSIS — M5441 Lumbago with sciatica, right side: Secondary | ICD-10-CM

## 2020-06-17 DIAGNOSIS — G8929 Other chronic pain: Secondary | ICD-10-CM | POA: Diagnosis not present

## 2020-06-17 DIAGNOSIS — T1591XS Foreign body on external eye, part unspecified, right eye, sequela: Secondary | ICD-10-CM

## 2020-06-17 NOTE — Patient Instructions (Signed)
  LOW BACK PAIN --> RIGHT LEG - check CT lumbar spine  NECK PAIN / HYPERREFLEXIA - check CT cervical spine  RIGHT EYE ORBITAL METAL FOREIGN OBJECT - follow up with ophthalmology for evaluation and possible removal

## 2020-06-17 NOTE — Progress Notes (Signed)
[facility]  PATIENT: Jason Alexander DOB: [birth year]  REFERRING CLINICIAN: Mechele Claude, MD HISTORY FROM: patient and wife  REASON FOR VISIT: new consult    HISTORICAL  CHIEF COMPLAINT:  Chief Complaint  Patient presents with  . Lumbar back pain    rm 7 New Pt wife-     HISTORY OF PRESENT ILLNESS:   44 year old male here for evaluation of low back pain.  Patient reports several years of chronic midline low back pain radiating to the right leg.  He has numbness in his right anterolateral thigh.  He has sharp pain shooting down the back of his right leg behind his knee and down into his foot.  Symptoms have been particularly worse in the past 2 years.  He went to PCP for evaluation and was referred here for further testing.  Apparently patient cannot have MRI due to metallic foreign body in the right orbit.  Patient also has constellation of other symptoms including numbness, weakness, memory problems, speech issues, weakness in his arms, hands, legs.  Also having increasing anxiety and memory lapse.   REVIEW OF SYSTEMS: Full 14 system review of systems performed and negative with exception of: As per HPI.  ALLERGIES: Allergies  Allergen Reactions  . Depakote [Divalproex Sodium] Other (See Comments)    Aggression      HOME MEDICATIONS: Outpatient Medications Prior to Visit  Medication Sig Dispense Refill  . pregabalin (LYRICA) 50 MG capsule 1 qhs X7 days , then 2 qhs X 7d, then 3 qhs X 7d, then 4 qhs (Patient not taking: Reported on 06/17/2020) 120 capsule 0   No facility-administered medications prior to visit.    PAST MEDICAL HISTORY: Past Medical History:  Diagnosis Date  . Depression 2006   after divorce    PAST SURGICAL HISTORY: Past Surgical History:  Procedure Laterality Date  . APPENDECTOMY    . NASAL SINUS SURGERY     polyp removal    FAMILY HISTORY: Family History  Problem Relation Age of Onset  . ADD / ADHD Mother   .  Hepatitis C Mother   . HIV Mother   . COPD Mother   . Alcohol abuse Mother   . Drug abuse Mother   . Heart disease Mother   . Heart attack Mother   . Hypertension Sister   . Cancer Father        colon    SOCIAL HISTORY: Social History   Socioeconomic History  . Marital status: Married    Spouse name: Lurena Joiner  . Number of children: Not on file  . Years of education: Not on file  . Highest education level: Not on file  Occupational History  . Not on file  Tobacco Use  . Smoking status: Current Every Day Smoker    Packs/day: 1.00    Years: 20.00    Pack years: 20.00    Types: Cigarettes  . Smokeless tobacco: Former Neurosurgeon    Quit date: 01/06/2011  . Tobacco comment: trying to quit  Substance and Sexual Activity  . Alcohol use: No  . Drug use: Yes    Types: Marijuana    Comment: 06/17/20 not daily, doesn't smoke, edibles  . Sexual activity: Yes    Comment: same partner male partner for 10 years, 2 kids  Other Topics Concern  . Not on file  Social History Narrative   Lives with wife   Social Determinants of Health   Financial Resource Strain:   . Difficulty of Paying  Living Expenses:   Food Insecurity:   . Worried About Programme researcher, broadcasting/film/video in the Last Year:   . Barista in the Last Year:   Transportation Needs:   . Freight forwarder (Medical):   Marland Kitchen Lack of Transportation (Non-Medical):   Physical Activity:   . Days of Exercise per Week:   . Minutes of Exercise per Session:   Stress:   . Feeling of Stress :   Social Connections:   . Frequency of Communication with Friends and Family:   . Frequency of Social Gatherings with Friends and Family:   . Attends Religious Services:   . Active Member of Clubs or Organizations:   . Attends Banker Meetings:   Marland Kitchen Marital Status:   Intimate Partner Violence:   . Fear of Current or Ex-Partner:   . Emotionally Abused:   Marland Kitchen Physically Abused:   . Sexually Abused:      PHYSICAL EXAM  GENERAL  EXAM/CONSTITUTIONAL: Vitals:  Vitals:   06/17/20 1605  BP: 124/78  Pulse: 85  Weight: 216 lb 3.2 oz (98.1 kg)  Height: 6' (1.829 m)     Body mass index is 29.32 kg/m. Wt Readings from Last 3 Encounters:  06/17/20 216 lb 3.2 oz (98.1 kg)  04/22/20 214 lb 6.4 oz (97.3 kg)  02/21/20 233 lb 9.6 oz (106 kg)     Patient is in no distress; well developed, nourished and groomed; neck is supple  CARDIOVASCULAR:  Examination of carotid arteries is normal; no carotid bruits  Regular rate and rhythm, no murmurs  Examination of peripheral vascular system by observation and palpation is normal  EYES:  Ophthalmoscopic exam of optic discs and posterior segments is normal; no papilledema or hemorrhages  No exam data present  MUSCULOSKELETAL:  Gait, strength, tone, movements noted in Neurologic exam below  NEUROLOGIC: MENTAL STATUS:  No flowsheet data found.  awake, alert, oriented to person, place and time  recent and remote memory intact  normal attention and concentration  language fluent, comprehension intact, naming intact  fund of knowledge appropriate  ANXIOUS; UNCOMFORTABLE APPEARING  CRANIAL NERVE:   2nd - no papilledema on fundoscopic exam  2nd, 3rd, 4th, 6th - pupils equal and reactive to light, visual fields full to confrontation, extraocular muscles intact, no nystagmus  5th - facial sensation symmetric  7th - facial strength symmetric  8th - hearing intact  9th - palate elevates symmetrically, uvula midline  11th - shoulder shrug symmetric  12th - tongue protrusion midline  MOTOR:   normal bulk and tone, full strength in the BUE, BLE  EXCEPT ATROPHY AND WEAKNESS OF RIGHT THENAR MUSCLES  SENSORY:   normal and symmetric to light touch, temperature, vibration; EXCEPT DECR IN RIGHT THIGH  COORDINATION:   finger-nose-finger, fine finger movements SLOW  REFLEXES:   deep tendon reflexes BRISK and symmetric  GAIT/STATION:   narrow  based gait; SLOW AND CAUTIOUS     DIAGNOSTIC DATA (LABS, IMAGING, TESTING) - I reviewed patient records, labs, notes, testing and imaging myself where available.  Lab Results  Component Value Date   WBC 8.1 02/19/2020   HGB 13.7 02/19/2020   HCT 41.0 02/19/2020   MCV 93.6 02/19/2020   PLT 274 02/19/2020      Component Value Date/Time   NA 138 02/19/2020 2023   NA 139 08/14/2019 1156   K 4.1 02/19/2020 2023   CL 103 02/19/2020 2023   CO2 25 02/19/2020 2023   GLUCOSE 99  02/19/2020 2023   BUN 26 (H) 02/19/2020 2023   BUN 19 08/14/2019 1156   CREATININE 1.12 02/19/2020 2023   CALCIUM 9.3 02/19/2020 2023   PROT 7.0 08/14/2019 1156   ALBUMIN 4.4 08/14/2019 1156   AST 16 08/14/2019 1156   ALT 12 08/14/2019 1156   ALKPHOS 69 08/14/2019 1156   BILITOT 0.3 08/14/2019 1156   GFRNONAA >60 02/19/2020 2023   GFRAA >60 02/19/2020 2023   Lab Results  Component Value Date   CHOL 244 (H) 08/14/2019   HDL 54 08/14/2019   LDLCALC 166 (H) 08/14/2019   TRIG 134 08/14/2019   CHOLHDL 4.5 08/14/2019   No results found for: HGBA1C Lab Results  Component Value Date   VITAMINB12 430 08/14/2019   Lab Results  Component Value Date   TSH 0.963 08/14/2019    09/25/19 XRAY ORBITS [I reviewed images myself and agree with interpretation. -VRP]  - Suspected metallic foreign body projecting over the RIGHT orbit. - Remainder of exam unremarkable.   ASSESSMENT AND PLAN  45 y.o. year old male here with constellation of symptoms, mainly concerned about low back pain rating to the right leg.  We will proceed with further work-up.  Also noted to have neck pain and hyperreflexia will check cervical spine as well.  Due to presence of right eye orbit metallic foreign body will refer to ophthalmology for evaluation and possible removal so that patient may be able to have MRI testing for more definitive diagnosis.   Dx:  1. Chronic midline low back pain with right-sided sciatica   2. Neck pain    3. Foreign body of right eye, sequela     PLAN:  LOW BACK PAIN --> RIGHT LEG - check CT lumbar spine  NECK PAIN / HYPERREFLEXIA - check CT cervical spine  ORBITAL METAL FOREIGN OBJECT - follow up with ophthalmology for evaluation and possible removal  Orders Placed This Encounter  Procedures  . CT LUMBAR SPINE WO CONTRAST  . CT CERVICAL SPINE WO CONTRAST  . Ambulatory referral to Ophthalmology   Return pending test results, for pending if symptoms worsen or fail to improve.    Suanne Marker, MD 06/17/2020, 4:13 PM Certified in Neurology, Neurophysiology and Neuroimaging  [facility] [address], [address] [place], [place] [address] [phone]

## 2020-06-18 ENCOUNTER — Telehealth: Payer: Self-pay | Admitting: Diagnostic Neuroimaging

## 2020-06-18 NOTE — Telephone Encounter (Signed)
Medicaid [employer] no auth patient is scheduled at GI for 07/03/20.

## 2020-07-03 ENCOUNTER — Ambulatory Visit
Admission: RE | Admit: 2020-07-03 | Discharge: 2020-07-03 | Disposition: A | Discharge status: Home / Self Care | Payer: Medicaid Other | Source: Ambulatory Visit | Attending: Diagnostic Neuroimaging | Admitting: Diagnostic Neuroimaging

## 2020-07-03 ENCOUNTER — Other Ambulatory Visit: Payer: Self-pay

## 2020-07-03 DIAGNOSIS — M4802 Spinal stenosis, cervical region: Secondary | ICD-10-CM | POA: Diagnosis not present

## 2020-07-03 DIAGNOSIS — M47812 Spondylosis without myelopathy or radiculopathy, cervical region: Secondary | ICD-10-CM | POA: Diagnosis not present

## 2020-07-03 DIAGNOSIS — G8929 Other chronic pain: Secondary | ICD-10-CM

## 2020-07-03 DIAGNOSIS — M545 Low back pain: Secondary | ICD-10-CM | POA: Diagnosis not present

## 2020-07-03 DIAGNOSIS — M542 Cervicalgia: Secondary | ICD-10-CM

## 2020-07-06 DIAGNOSIS — Z419 Encounter for procedure for purposes other than remedying health state, unspecified: Secondary | ICD-10-CM | POA: Diagnosis not present

## 2020-07-14 ENCOUNTER — Telehealth: Payer: Self-pay | Admitting: *Deleted

## 2020-07-14 NOTE — Telephone Encounter (Signed)
Attempted to reach patient with results. No answer, VMB full.

## 2020-07-16 ENCOUNTER — Encounter: Payer: Self-pay | Admitting: *Deleted

## 2020-07-16 NOTE — Telephone Encounter (Signed)
Unable to reach patient on 2nd attempt and VMB full. Put letters in mail with Dr Richrd Humbles result notes.

## 2020-07-21 DIAGNOSIS — H0551 Retained (old) foreign body following penetrating wound of right orbit: Secondary | ICD-10-CM | POA: Diagnosis not present

## 2020-08-06 DIAGNOSIS — Z419 Encounter for procedure for purposes other than remedying health state, unspecified: Secondary | ICD-10-CM | POA: Diagnosis not present

## 2020-09-05 DIAGNOSIS — Z419 Encounter for procedure for purposes other than remedying health state, unspecified: Secondary | ICD-10-CM | POA: Diagnosis not present

## 2020-10-06 DIAGNOSIS — Z419 Encounter for procedure for purposes other than remedying health state, unspecified: Secondary | ICD-10-CM | POA: Diagnosis not present

## 2020-10-08 ENCOUNTER — Ambulatory Visit (INDEPENDENT_AMBULATORY_CARE_PROVIDER_SITE_OTHER): Payer: Medicaid Other | Admitting: Family Medicine

## 2020-10-08 ENCOUNTER — Other Ambulatory Visit: Payer: Self-pay

## 2020-10-08 ENCOUNTER — Encounter: Payer: Self-pay | Admitting: Family Medicine

## 2020-10-08 VITALS — BP 130/89 | HR 81 | Temp 97.9°F | Resp 20 | Ht 72.0 in | Wt 202.5 lb

## 2020-10-08 DIAGNOSIS — F339 Major depressive disorder, recurrent, unspecified: Secondary | ICD-10-CM

## 2020-10-08 MED ORDER — DULOXETINE HCL 30 MG PO CPEP: 30.0000 mg | ORAL_CAPSULE | Freq: Every day | ORAL | 0 refills | Status: DC
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
## 2020-10-08 NOTE — Patient Instructions (Signed)
Your provider wants you to schedule an appointment with a Psychologist/Psychiatrist. The following list of offices requires the patient to call and make their own appointment, as there is information they need that only you can provide. Please feel free to choose form the following providers:  [facility]   [phone] Crisis Recovery in [place] [phone]  [facility]  [phone]   [address] [place], [place]  (Scheduled through [facility]) Must call and do an interview for appointment. Sees Children / Accepts Medicaid  [facility]    [phone]  [address], [address]    [place], [place]       [facility]  [phone] [address] [place], [place]  Evaluates for Autism but does not treat it Sees Children / Accepts Medicaid  [facility]    [phone] [address], [address]   [place], [place] Medication management, substance abuse, bipolar, grief, family, marriage, OCD, anxiety, PTSD Sees children / Accepts Medicaid  [facility]    [phone] [address] [place], [place] Sees children / Accepts Medicaid  [facility]  [phone] [address] [place], [place]   Dr Akinlayo     [phone] [address] [place], [place]  Sees ADD & ADHD for treatment Accepts Medicaid  [facility]  [phone] [address] [place], [place] Evaluates for Autism Accepts Medicaid  [facility]  [phone] [address] [place], [place]  Does Adult ADD evaluations Does not accept Medicaid  [facility]   [phone] [address]   [place], [place] Uses animal therapy  Sees children as young as 3 years old Accepts Medicaid  [facility]     [phone]    [address]  [place], [place] [address] Sees children Accepts Medicaid  

## 2020-10-26 DIAGNOSIS — R0602 Shortness of breath: Secondary | ICD-10-CM | POA: Diagnosis not present

## 2020-10-26 DIAGNOSIS — J029 Acute pharyngitis, unspecified: Secondary | ICD-10-CM | POA: Diagnosis not present

## 2020-10-26 DIAGNOSIS — R52 Pain, unspecified: Secondary | ICD-10-CM | POA: Diagnosis not present

## 2020-10-26 DIAGNOSIS — R110 Nausea: Secondary | ICD-10-CM | POA: Diagnosis not present

## 2020-10-26 DIAGNOSIS — R509 Fever, unspecified: Secondary | ICD-10-CM | POA: Diagnosis not present

## 2020-10-26 DIAGNOSIS — R0989 Other specified symptoms and signs involving the circulatory and respiratory systems: Secondary | ICD-10-CM | POA: Diagnosis not present

## 2020-10-26 DIAGNOSIS — R059 Cough, unspecified: Secondary | ICD-10-CM | POA: Diagnosis not present

## 2020-10-27 ENCOUNTER — Inpatient Hospital Stay (HOSPITAL_COMMUNITY)
Admission: EM | Admit: 2020-10-27 | Discharge: 2020-11-15 | DRG: 956 | Disposition: A | Discharge status: Rehabilitation Facility | Payer: Medicaid Other | Attending: Physician Assistant | Admitting: Physician Assistant

## 2020-10-27 ENCOUNTER — Emergency Department (HOSPITAL_COMMUNITY): Payer: Medicaid Other

## 2020-10-27 ENCOUNTER — Encounter (HOSPITAL_COMMUNITY): Payer: Self-pay | Admitting: Emergency Medicine

## 2020-10-27 ENCOUNTER — Other Ambulatory Visit: Payer: Self-pay

## 2020-10-27 DIAGNOSIS — S0990XA Unspecified injury of head, initial encounter: Secondary | ICD-10-CM | POA: Diagnosis not present

## 2020-10-27 DIAGNOSIS — S728X2A Other fracture of left femur, initial encounter for closed fracture: Secondary | ICD-10-CM | POA: Diagnosis not present

## 2020-10-27 DIAGNOSIS — I951 Orthostatic hypotension: Secondary | ICD-10-CM | POA: Diagnosis not present

## 2020-10-27 DIAGNOSIS — Y92410 Unspecified street and highway as the place of occurrence of the external cause: Secondary | ICD-10-CM | POA: Diagnosis not present

## 2020-10-27 DIAGNOSIS — T1490XA Injury, unspecified, initial encounter: Secondary | ICD-10-CM

## 2020-10-27 DIAGNOSIS — F322 Major depressive disorder, single episode, severe without psychotic features: Secondary | ICD-10-CM | POA: Diagnosis not present

## 2020-10-27 DIAGNOSIS — R102 Pelvic and perineal pain: Secondary | ICD-10-CM | POA: Diagnosis not present

## 2020-10-27 DIAGNOSIS — Z419 Encounter for procedure for purposes other than remedying health state, unspecified: Secondary | ICD-10-CM

## 2020-10-27 DIAGNOSIS — R079 Chest pain, unspecified: Secondary | ICD-10-CM | POA: Diagnosis not present

## 2020-10-27 DIAGNOSIS — Z20822 Contact with and (suspected) exposure to covid-19: Secondary | ICD-10-CM | POA: Diagnosis present

## 2020-10-27 DIAGNOSIS — S52352A Displaced comminuted fracture of shaft of radius, left arm, initial encounter for closed fracture: Secondary | ICD-10-CM | POA: Diagnosis not present

## 2020-10-27 DIAGNOSIS — Z743 Need for continuous supervision: Secondary | ICD-10-CM | POA: Diagnosis not present

## 2020-10-27 DIAGNOSIS — R45851 Suicidal ideations: Secondary | ICD-10-CM | POA: Diagnosis present

## 2020-10-27 DIAGNOSIS — S42352A Displaced comminuted fracture of shaft of humerus, left arm, initial encounter for closed fracture: Secondary | ICD-10-CM | POA: Diagnosis present

## 2020-10-27 DIAGNOSIS — F411 Generalized anxiety disorder: Secondary | ICD-10-CM | POA: Diagnosis not present

## 2020-10-27 DIAGNOSIS — R52 Pain, unspecified: Secondary | ICD-10-CM | POA: Diagnosis not present

## 2020-10-27 DIAGNOSIS — S5420XA Injury of radial nerve at forearm level, unspecified arm, initial encounter: Secondary | ICD-10-CM | POA: Diagnosis present

## 2020-10-27 DIAGNOSIS — D62 Acute posthemorrhagic anemia: Secondary | ICD-10-CM | POA: Diagnosis not present

## 2020-10-27 DIAGNOSIS — S3991XA Unspecified injury of abdomen, initial encounter: Secondary | ICD-10-CM | POA: Diagnosis not present

## 2020-10-27 DIAGNOSIS — E559 Vitamin D deficiency, unspecified: Secondary | ICD-10-CM | POA: Diagnosis not present

## 2020-10-27 DIAGNOSIS — G5632 Lesion of radial nerve, left upper limb: Secondary | ICD-10-CM | POA: Diagnosis not present

## 2020-10-27 DIAGNOSIS — S72322A Displaced transverse fracture of shaft of left femur, initial encounter for closed fracture: Secondary | ICD-10-CM

## 2020-10-27 DIAGNOSIS — E8889 Other specified metabolic disorders: Secondary | ICD-10-CM | POA: Diagnosis present

## 2020-10-27 DIAGNOSIS — F1510 Other stimulant abuse, uncomplicated: Secondary | ICD-10-CM | POA: Diagnosis present

## 2020-10-27 DIAGNOSIS — X828XXA Other intentional self-harm by crashing of motor vehicle, initial encounter: Secondary | ICD-10-CM | POA: Diagnosis not present

## 2020-10-27 DIAGNOSIS — F10129 Alcohol abuse with intoxication, unspecified: Secondary | ICD-10-CM | POA: Diagnosis not present

## 2020-10-27 DIAGNOSIS — F32A Depression, unspecified: Secondary | ICD-10-CM | POA: Diagnosis present

## 2020-10-27 DIAGNOSIS — S42302A Unspecified fracture of shaft of humerus, left arm, initial encounter for closed fracture: Secondary | ICD-10-CM

## 2020-10-27 DIAGNOSIS — S72352A Displaced comminuted fracture of shaft of left femur, initial encounter for closed fracture: Principal | ICD-10-CM | POA: Diagnosis present

## 2020-10-27 DIAGNOSIS — F410 Panic disorder [episodic paroxysmal anxiety] without agoraphobia: Secondary | ICD-10-CM | POA: Diagnosis not present

## 2020-10-27 DIAGNOSIS — S5292XA Unspecified fracture of left forearm, initial encounter for closed fracture: Secondary | ICD-10-CM | POA: Diagnosis not present

## 2020-10-27 DIAGNOSIS — S52302A Unspecified fracture of shaft of left radius, initial encounter for closed fracture: Secondary | ICD-10-CM

## 2020-10-27 DIAGNOSIS — F17210 Nicotine dependence, cigarettes, uncomplicated: Secondary | ICD-10-CM | POA: Diagnosis not present

## 2020-10-27 DIAGNOSIS — S199XXA Unspecified injury of neck, initial encounter: Secondary | ICD-10-CM | POA: Diagnosis not present

## 2020-10-27 DIAGNOSIS — M25552 Pain in left hip: Secondary | ICD-10-CM | POA: Diagnosis not present

## 2020-10-27 DIAGNOSIS — F332 Major depressive disorder, recurrent severe without psychotic features: Secondary | ICD-10-CM | POA: Diagnosis not present

## 2020-10-27 DIAGNOSIS — R0902 Hypoxemia: Secondary | ICD-10-CM | POA: Diagnosis not present

## 2020-10-27 DIAGNOSIS — S42352S Displaced comminuted fracture of shaft of humerus, left arm, sequela: Secondary | ICD-10-CM | POA: Diagnosis not present

## 2020-10-27 DIAGNOSIS — S52252A Displaced comminuted fracture of shaft of ulna, left arm, initial encounter for closed fracture: Secondary | ICD-10-CM | POA: Diagnosis not present

## 2020-10-27 DIAGNOSIS — M62838 Other muscle spasm: Secondary | ICD-10-CM | POA: Diagnosis not present

## 2020-10-27 DIAGNOSIS — T148XXA Other injury of unspecified body region, initial encounter: Secondary | ICD-10-CM

## 2020-10-27 DIAGNOSIS — S52352S Displaced comminuted fracture of shaft of radius, left arm, sequela: Secondary | ICD-10-CM | POA: Diagnosis not present

## 2020-10-27 DIAGNOSIS — S72302A Unspecified fracture of shaft of left femur, initial encounter for closed fracture: Secondary | ICD-10-CM

## 2020-10-27 DIAGNOSIS — S299XXA Unspecified injury of thorax, initial encounter: Secondary | ICD-10-CM | POA: Diagnosis not present

## 2020-10-27 DIAGNOSIS — S52202A Unspecified fracture of shaft of left ulna, initial encounter for closed fracture: Secondary | ICD-10-CM

## 2020-10-27 DIAGNOSIS — S42309A Unspecified fracture of shaft of humerus, unspecified arm, initial encounter for closed fracture: Secondary | ICD-10-CM | POA: Diagnosis present

## 2020-10-27 HISTORY — DX: Displaced comminuted fracture of shaft of humerus, left arm, initial encounter for closed fracture: S42.352A

## 2020-10-27 LAB — COMPREHENSIVE METABOLIC PANEL
ALT: 16 U/L (ref 0–44)
AST: 20 U/L (ref 15–41)
Albumin: 4.1 g/dL (ref 3.5–5.0)
Alkaline Phosphatase: 59 U/L (ref 38–126)
Anion gap: 15 (ref 5–15)
BUN: 12 mg/dL (ref 6–20)
CO2: 21 mmol/L — ABNORMAL LOW (ref 22–32)
Calcium: 9.4 mg/dL (ref 8.9–10.3)
Chloride: 105 mmol/L (ref 98–111)
Creatinine, Ser: 1.25 mg/dL — ABNORMAL HIGH (ref 0.61–1.24)
GFR, Estimated: >60 mL/min (ref >60)
Glucose, Bld: 123 mg/dL — ABNORMAL HIGH (ref 70–99)
Potassium: 3.5 mmol/L (ref 3.5–5.1)
Sodium: 141 mmol/L (ref 135–145)
Total Bilirubin: 0.7 mg/dL (ref 0.3–1.2)
Total Protein: 7 g/dL (ref 6.5–8.1)

## 2020-10-27 LAB — ETHANOL: Alcohol, Ethyl (B): 252 mg/dL — ABNORMAL HIGH (ref <10)

## 2020-10-27 LAB — LACTIC ACID, PLASMA: Lactic Acid, Venous: 4 mmol/L (ref 0.5–1.9)

## 2020-10-27 LAB — CBC
HCT: 45.3 % (ref 39.0–52.0)
Hemoglobin: 14.7 g/dL (ref 13.0–17.0)
MCH: 31.4 pg (ref 26.0–34.0)
MCHC: 32.5 g/dL (ref 30.0–36.0)
MCV: 96.8 fL (ref 80.0–100.0)
Platelets: 340 10*3/uL (ref 150–400)
RBC: 4.68 MIL/uL (ref 4.22–5.81)
RDW: 13.2 % (ref 11.5–15.5)
WBC: 13 10*3/uL — ABNORMAL HIGH (ref 4.0–10.5)
nRBC: 0 % (ref 0.0–0.2)

## 2020-10-27 LAB — I-STAT CHEM 8, ED
BUN: 14 mg/dL (ref 6–20)
Calcium, Ion: 0.93 mmol/L — ABNORMAL LOW (ref 1.15–1.40)
Chloride: 108 mmol/L (ref 98–111)
Creatinine, Ser: 1.7 mg/dL — ABNORMAL HIGH (ref 0.61–1.24)
Glucose, Bld: 120 mg/dL — ABNORMAL HIGH (ref 70–99)
HCT: 44 % (ref 39.0–52.0)
Hemoglobin: 15 g/dL (ref 13.0–17.0)
Potassium: 3.4 mmol/L — ABNORMAL LOW (ref 3.5–5.1)
Sodium: 141 mmol/L (ref 135–145)
TCO2: 20 mmol/L — ABNORMAL LOW (ref 22–32)

## 2020-10-27 LAB — URINALYSIS, ROUTINE W REFLEX MICROSCOPIC
Bacteria, UA: NONE SEEN
Bilirubin Urine: NEGATIVE
Glucose, UA: NEGATIVE mg/dL
Ketones, ur: NEGATIVE mg/dL
Leukocytes,Ua: NEGATIVE
Nitrite: NEGATIVE
Protein, ur: NEGATIVE mg/dL
Specific Gravity, Urine: 1.025 (ref 1.005–1.030)
pH: 7 (ref 5.0–8.0)

## 2020-10-27 LAB — RESPIRATORY PANEL BY RT PCR (FLU A&B, COVID)
Influenza A by PCR: NEGATIVE
Influenza B by PCR: NEGATIVE
SARS Coronavirus 2 by RT PCR: NEGATIVE

## 2020-10-27 LAB — ABO/RH: ABO/RH(D): A NEG

## 2020-10-27 LAB — TYPE AND SCREEN
ABO/RH(D): A NEG
Antibody Screen: NEGATIVE

## 2020-10-27 LAB — PROTIME-INR
INR: 1 (ref 0.8–1.2)
Prothrombin Time: 12.5 seconds (ref 11.4–15.2)

## 2020-10-27 MED ORDER — METOPROLOL TARTRATE 5 MG/5ML IV SOLN: 5.0000 mg | Freq: Four times a day (QID) | INTRAVENOUS | Status: DC | PRN

## 2020-10-27 MED ORDER — OXYCODONE HCL 5 MG PO TABS
5.0000 mg | ORAL_TABLET | ORAL | Status: DC | PRN
  Administered 2020-10-27 – 2020-10-28 (×2): 5 mg via ORAL
  Filled 2020-10-27 (×2): qty 1

## 2020-10-27 MED ORDER — ENOXAPARIN SODIUM 30 MG/0.3ML ~~LOC~~ SOLN
30.0000 mg | Freq: Two times a day (BID) | SUBCUTANEOUS | Status: DC
  Administered 2020-10-29 – 2020-11-15 (×35): 30 mg via SUBCUTANEOUS
  Filled 2020-10-27 (×35): qty 0.3

## 2020-10-27 MED ORDER — HYDROMORPHONE HCL 1 MG/ML IJ SOLN
INTRAMUSCULAR | Status: AC
  Administered 2020-10-27: 1 mg via INTRAVENOUS
  Filled 2020-10-27: qty 1

## 2020-10-27 MED ORDER — HYDROMORPHONE HCL 1 MG/ML IJ SOLN: 1.0000 mg | Freq: Once | INTRAMUSCULAR | Status: AC

## 2020-10-27 MED ORDER — SODIUM CHLORIDE 0.9 % IV BOLUS
1000.0000 mL | Freq: Once | INTRAVENOUS | Status: AC
  Administered 2020-10-27: 1000 mL via INTRAVENOUS

## 2020-10-27 MED ORDER — ONDANSETRON 4 MG PO TBDP: 4.0000 mg | ORAL_TABLET | Freq: Four times a day (QID) | ORAL | Status: DC | PRN

## 2020-10-27 MED ORDER — FENTANYL CITRATE (PF) 100 MCG/2ML IJ SOLN
INTRAMUSCULAR | Status: AC
  Filled 2020-10-27: qty 2

## 2020-10-27 MED ORDER — DEXTROSE-NACL 5-0.45 % IV SOLN: INTRAVENOUS | Status: DC

## 2020-10-27 MED ORDER — ONDANSETRON HCL 4 MG/2ML IJ SOLN: 4.0000 mg | Freq: Four times a day (QID) | INTRAMUSCULAR | Status: DC | PRN

## 2020-10-27 MED ORDER — ONDANSETRON HCL 4 MG/2ML IJ SOLN: 4.0000 mg | Freq: Once | INTRAMUSCULAR | Status: DC

## 2020-10-27 MED ORDER — DOCUSATE SODIUM 100 MG PO CAPS
100.0000 mg | ORAL_CAPSULE | Freq: Two times a day (BID) | ORAL | Status: DC
  Administered 2020-10-27 – 2020-11-15 (×34): 100 mg via ORAL
  Filled 2020-10-27 (×39): qty 1

## 2020-10-27 MED ORDER — FENTANYL CITRATE (PF) 100 MCG/2ML IJ SOLN
100.0000 ug | Freq: Once | INTRAMUSCULAR | Status: AC
  Administered 2020-10-27: 100 ug via INTRAVENOUS

## 2020-10-27 MED ORDER — MORPHINE SULFATE (PF) 2 MG/ML IV SOLN
INTRAVENOUS | Status: AC
  Administered 2020-10-27: 2 mg via INTRAVENOUS
  Filled 2020-10-27: qty 1

## 2020-10-27 MED ORDER — MORPHINE SULFATE (PF) 2 MG/ML IV SOLN
2.0000 mg | INTRAVENOUS | Status: DC | PRN
  Administered 2020-10-27 – 2020-10-28 (×5): 4 mg via INTRAVENOUS
  Administered 2020-10-28 (×3): 2 mg via INTRAVENOUS
  Administered 2020-10-29 (×2): 4 mg via INTRAVENOUS
  Filled 2020-10-27 (×2): qty 2
  Filled 2020-10-27: qty 1
  Filled 2020-10-27 (×4): qty 2
  Filled 2020-10-27: qty 1
  Filled 2020-10-27: qty 2
  Filled 2020-10-27 (×2): qty 1

## 2020-10-27 MED ORDER — SODIUM CHLORIDE 0.9 % IV SOLN: INTRAVENOUS | Status: DC

## 2020-10-27 MED ORDER — IOHEXOL 300 MG/ML  SOLN
100.0000 mL | Freq: Once | INTRAMUSCULAR | Status: AC | PRN
  Administered 2020-10-27: 100 mL via INTRAVENOUS

## 2020-10-27 MED ORDER — ACETAMINOPHEN 325 MG PO TABS
650.0000 mg | ORAL_TABLET | ORAL | Status: DC | PRN
  Administered 2020-10-27 – 2020-10-28 (×2): 650 mg via ORAL
  Filled 2020-10-27 (×2): qty 2
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
## 2020-10-27 NOTE — ED Notes (Signed)
Pt transferred to CT scanner
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
## 2020-10-27 NOTE — Progress Notes (Addendum)
   10/27/20 1750  Clinical Encounter Type  Visited With Patient not available  Visit Type Trauma  Referral From Nurse  Consult/Referral To Chaplain   Chaplain responded to level 2 alert. The patient is being treated by the medical team. The patient's family was not present. Please contact the Spiritual Care team if our support is needed.  This note was prepared by Deneen Harts, M.Div..  For questions please contact by phone [phone].

## 2020-10-27 NOTE — Progress Notes (Signed)
Orthopedic Tech Progress Note Patient Details:  Jason Alexander January 10, 1976 [id number]  Ortho Devices Type of Ortho Device: Long arm splint Ortho Device/Splint Location: Left Arm Ortho Device/Splint Interventions: Application   Post Interventions Patient Tolerated: Well  Musculoskeletal Traction Type of Traction: Bucks Skin Traction Traction Location: Left Leg Traction Weight: 10 lbs   Post Interventions Patient Tolerated: Well   Jason Alexander Tarek Cravens 10/27/2020, 8:58 PM

## 2020-10-28 ENCOUNTER — Encounter (HOSPITAL_COMMUNITY): Admission: EM | Disposition: A | Discharge status: Rehabilitation Facility | Payer: Self-pay | Source: Home / Self Care

## 2020-10-28 ENCOUNTER — Inpatient Hospital Stay (HOSPITAL_COMMUNITY): Payer: Medicaid Other

## 2020-10-28 ENCOUNTER — Encounter (HOSPITAL_COMMUNITY): Payer: Self-pay

## 2020-10-28 ENCOUNTER — Inpatient Hospital Stay (HOSPITAL_COMMUNITY): Payer: Medicaid Other | Admitting: Certified Registered Nurse Anesthetist

## 2020-10-28 DIAGNOSIS — S52252A Displaced comminuted fracture of shaft of ulna, left arm, initial encounter for closed fracture: Secondary | ICD-10-CM | POA: Diagnosis not present

## 2020-10-28 DIAGNOSIS — R45851 Suicidal ideations: Secondary | ICD-10-CM | POA: Diagnosis not present

## 2020-10-28 DIAGNOSIS — F329 Major depressive disorder, single episode, unspecified: Secondary | ICD-10-CM | POA: Diagnosis not present

## 2020-10-28 DIAGNOSIS — S4422XA Injury of radial nerve at upper arm level, left arm, initial encounter: Secondary | ICD-10-CM | POA: Diagnosis not present

## 2020-10-28 DIAGNOSIS — Z9889 Other specified postprocedural states: Secondary | ICD-10-CM | POA: Diagnosis not present

## 2020-10-28 DIAGNOSIS — S72352A Displaced comminuted fracture of shaft of left femur, initial encounter for closed fracture: Secondary | ICD-10-CM | POA: Diagnosis not present

## 2020-10-28 DIAGNOSIS — S52592A Other fractures of lower end of left radius, initial encounter for closed fracture: Secondary | ICD-10-CM | POA: Diagnosis not present

## 2020-10-28 DIAGNOSIS — S5420XA Injury of radial nerve at forearm level, unspecified arm, initial encounter: Secondary | ICD-10-CM | POA: Diagnosis not present

## 2020-10-28 DIAGNOSIS — S42302A Unspecified fracture of shaft of humerus, left arm, initial encounter for closed fracture: Secondary | ICD-10-CM | POA: Diagnosis not present

## 2020-10-28 DIAGNOSIS — S42392A Other fracture of shaft of left humerus, initial encounter for closed fracture: Secondary | ICD-10-CM | POA: Diagnosis not present

## 2020-10-28 DIAGNOSIS — S52352A Displaced comminuted fracture of shaft of radius, left arm, initial encounter for closed fracture: Secondary | ICD-10-CM | POA: Diagnosis not present

## 2020-10-28 DIAGNOSIS — Z20822 Contact with and (suspected) exposure to covid-19: Secondary | ICD-10-CM | POA: Diagnosis not present

## 2020-10-28 DIAGNOSIS — S72002A Fracture of unspecified part of neck of left femur, initial encounter for closed fracture: Secondary | ICD-10-CM | POA: Diagnosis not present

## 2020-10-28 DIAGNOSIS — S52202A Unspecified fracture of shaft of left ulna, initial encounter for closed fracture: Secondary | ICD-10-CM | POA: Diagnosis not present

## 2020-10-28 DIAGNOSIS — G5632 Lesion of radial nerve, left upper limb: Secondary | ICD-10-CM | POA: Diagnosis not present

## 2020-10-28 DIAGNOSIS — S42292A Other displaced fracture of upper end of left humerus, initial encounter for closed fracture: Secondary | ICD-10-CM | POA: Diagnosis not present

## 2020-10-28 DIAGNOSIS — S42352A Displaced comminuted fracture of shaft of humerus, left arm, initial encounter for closed fracture: Secondary | ICD-10-CM | POA: Diagnosis not present

## 2020-10-28 DIAGNOSIS — S728X2A Other fracture of left femur, initial encounter for closed fracture: Secondary | ICD-10-CM | POA: Diagnosis not present

## 2020-10-28 DIAGNOSIS — D62 Acute posthemorrhagic anemia: Secondary | ICD-10-CM | POA: Diagnosis not present

## 2020-10-28 DIAGNOSIS — F17210 Nicotine dependence, cigarettes, uncomplicated: Secondary | ICD-10-CM | POA: Diagnosis not present

## 2020-10-28 DIAGNOSIS — F32A Depression, unspecified: Secondary | ICD-10-CM | POA: Diagnosis not present

## 2020-10-28 HISTORY — PX: ORIF HUMERUS FRACTURE: SHX2126

## 2020-10-28 HISTORY — PX: ORIF RADIAL FRACTURE: SHX5113

## 2020-10-28 HISTORY — PX: FEMUR IM NAIL: SHX1597

## 2020-10-28 LAB — BASIC METABOLIC PANEL
Anion gap: 13 (ref 5–15)
BUN: 8 mg/dL (ref 6–20)
CO2: 22 mmol/L (ref 22–32)
Calcium: 8.5 mg/dL — ABNORMAL LOW (ref 8.9–10.3)
Chloride: 103 mmol/L (ref 98–111)
Creatinine, Ser: 0.97 mg/dL (ref 0.61–1.24)
GFR, Estimated: >60 mL/min (ref >60)
Glucose, Bld: 159 mg/dL — ABNORMAL HIGH (ref 70–99)
Potassium: 3.7 mmol/L (ref 3.5–5.1)
Sodium: 138 mmol/L (ref 135–145)

## 2020-10-28 LAB — CBC
HCT: 39.2 % (ref 39.0–52.0)
Hemoglobin: 13.1 g/dL (ref 13.0–17.0)
MCH: 31.6 pg (ref 26.0–34.0)
MCHC: 33.4 g/dL (ref 30.0–36.0)
MCV: 94.5 fL (ref 80.0–100.0)
Platelets: 299 10*3/uL (ref 150–400)
RBC: 4.15 MIL/uL — ABNORMAL LOW (ref 4.22–5.81)
RDW: 13.4 % (ref 11.5–15.5)
WBC: 8.9 10*3/uL (ref 4.0–10.5)
nRBC: 0 % (ref 0.0–0.2)

## 2020-10-28 LAB — SURGICAL PCR SCREEN
MRSA, PCR: NEGATIVE
Staphylococcus aureus: POSITIVE — AB

## 2020-10-28 LAB — LACTIC ACID, PLASMA: Lactic Acid, Venous: 0.9 mmol/L (ref 0.5–1.9)

## 2020-10-28 LAB — HIV ANTIBODY (ROUTINE TESTING W REFLEX): HIV Screen 4th Generation wRfx: NONREACTIVE

## 2020-10-28 SURGERY — INSERTION, INTRAMEDULLARY ROD, FEMUR, RETROGRADE
Anesthesia: General | Site: Arm Lower | Laterality: Left

## 2020-10-28 MED ORDER — ADULT MULTIVITAMIN W/MINERALS CH
1.0000 | ORAL_TABLET | Freq: Every day | ORAL | Status: DC
  Administered 2020-10-29 – 2020-11-15 (×18): 1 via ORAL
  Filled 2020-10-28 (×18): qty 1

## 2020-10-28 MED ORDER — FENTANYL CITRATE (PF) 250 MCG/5ML IJ SOLN
INTRAMUSCULAR | Status: DC | PRN
  Administered 2020-10-28 (×6): 50 ug via INTRAVENOUS
  Administered 2020-10-28: 100 ug via INTRAVENOUS
  Administered 2020-10-28: 50 ug via INTRAVENOUS

## 2020-10-28 MED ORDER — DEXAMETHASONE SODIUM PHOSPHATE 10 MG/ML IJ SOLN
INTRAMUSCULAR | Status: DC | PRN
  Administered 2020-10-28: 5 mg via INTRAVENOUS

## 2020-10-28 MED ORDER — CEFAZOLIN SODIUM-DEXTROSE 2-3 GM-%(50ML) IV SOLR
INTRAVENOUS | Status: DC | PRN
  Administered 2020-10-28 (×2): 2 g via INTRAVENOUS

## 2020-10-28 MED ORDER — VITAMIN D 25 MCG (1000 UNIT) PO TABS
2000.0000 [IU] | ORAL_TABLET | Freq: Two times a day (BID) | ORAL | Status: DC
  Administered 2020-10-29 – 2020-11-15 (×35): 2000 [IU] via ORAL
  Filled 2020-10-28 (×35): qty 2

## 2020-10-28 MED ORDER — PROPOFOL 10 MG/ML IV BOLUS
INTRAVENOUS | Status: AC
  Filled 2020-10-28: qty 20

## 2020-10-28 MED ORDER — FENTANYL CITRATE (PF) 100 MCG/2ML IJ SOLN
100.0000 ug | Freq: Once | INTRAMUSCULAR | Status: AC
  Administered 2020-10-28: 50 ug via INTRAVENOUS

## 2020-10-28 MED ORDER — LABETALOL HCL 5 MG/ML IV SOLN
10.0000 mg | INTRAVENOUS | Status: AC | PRN
  Administered 2020-10-28 (×2): 10 mg via INTRAVENOUS

## 2020-10-28 MED ORDER — 0.9 % SODIUM CHLORIDE (POUR BTL) OPTIME
TOPICAL | Status: DC | PRN
  Administered 2020-10-28: 3000 mL

## 2020-10-28 MED ORDER — ROCURONIUM BROMIDE 10 MG/ML (PF) SYRINGE
PREFILLED_SYRINGE | INTRAVENOUS | Status: AC
  Filled 2020-10-28: qty 10

## 2020-10-28 MED ORDER — HYDROMORPHONE HCL 1 MG/ML IJ SOLN
INTRAMUSCULAR | Status: AC
  Filled 2020-10-28: qty 1

## 2020-10-28 MED ORDER — METHOCARBAMOL 1000 MG/10ML IJ SOLN
1000.0000 mg | Freq: Three times a day (TID) | INTRAVENOUS | Status: DC
  Administered 2020-10-29: 1000 mg via INTRAVENOUS
  Filled 2020-10-28: qty 10

## 2020-10-28 MED ORDER — OXYCODONE HCL 5 MG PO TABS
5.0000 mg | ORAL_TABLET | ORAL | Status: DC | PRN
  Administered 2020-10-29 – 2020-10-31 (×12): 15 mg via ORAL
  Filled 2020-10-28 (×12): qty 3

## 2020-10-28 MED ORDER — PHENYLEPHRINE 40 MCG/ML (10ML) SYRINGE FOR IV PUSH (FOR BLOOD PRESSURE SUPPORT)
PREFILLED_SYRINGE | INTRAVENOUS | Status: AC
  Filled 2020-10-28: qty 10

## 2020-10-28 MED ORDER — MEPERIDINE HCL 25 MG/ML IJ SOLN
6.2500 mg | INTRAMUSCULAR | Status: DC | PRN
  Administered 2020-10-28: 12.5 mg via INTRAVENOUS

## 2020-10-28 MED ORDER — CEFAZOLIN SODIUM 1 G IJ SOLR
INTRAMUSCULAR | Status: AC
  Filled 2020-10-28: qty 20

## 2020-10-28 MED ORDER — HYDROMORPHONE HCL 1 MG/ML IJ SOLN
INTRAMUSCULAR | Status: DC | PRN
Start: 2020-10-28 — End: 2020-10-28
  Administered 2020-10-28: .5 mg via INTRAVENOUS
  Administered 2020-10-28 (×2): .25 mg via INTRAVENOUS

## 2020-10-28 MED ORDER — LACTATED RINGERS IV SOLN: INTRAVENOUS | Status: DC | PRN

## 2020-10-28 MED ORDER — HYDROMORPHONE HCL 1 MG/ML IJ SOLN
0.2500 mg | INTRAMUSCULAR | Status: DC | PRN
  Administered 2020-10-28 (×2): 0.5 mg via INTRAVENOUS

## 2020-10-28 MED ORDER — PROPOFOL 10 MG/ML IV BOLUS
INTRAVENOUS | Status: DC | PRN
  Administered 2020-10-28: 200 mg via INTRAVENOUS

## 2020-10-28 MED ORDER — PHENYLEPHRINE 40 MCG/ML (10ML) SYRINGE FOR IV PUSH (FOR BLOOD PRESSURE SUPPORT)
PREFILLED_SYRINGE | INTRAVENOUS | Status: DC | PRN
  Administered 2020-10-28 (×2): 80 ug via INTRAVENOUS

## 2020-10-28 MED ORDER — MEPERIDINE HCL 25 MG/ML IJ SOLN
INTRAMUSCULAR | Status: AC
  Filled 2020-10-28: qty 1

## 2020-10-28 MED ORDER — ROCURONIUM BROMIDE 10 MG/ML (PF) SYRINGE
PREFILLED_SYRINGE | INTRAVENOUS | Status: AC
  Filled 2020-10-28: qty 20

## 2020-10-28 MED ORDER — MIDAZOLAM HCL 2 MG/2ML IJ SOLN
INTRAMUSCULAR | Status: AC
  Filled 2020-10-28: qty 2

## 2020-10-28 MED ORDER — ONDANSETRON HCL 4 MG/2ML IJ SOLN: 4.0000 mg | Freq: Once | INTRAMUSCULAR | Status: DC | PRN

## 2020-10-28 MED ORDER — SUGAMMADEX SODIUM 200 MG/2ML IV SOLN
INTRAVENOUS | Status: DC | PRN
  Administered 2020-10-28: 200 mg via INTRAVENOUS

## 2020-10-28 MED ORDER — ACETAMINOPHEN 500 MG PO TABS
1000.0000 mg | ORAL_TABLET | Freq: Four times a day (QID) | ORAL | Status: DC
  Administered 2020-10-29 – 2020-11-15 (×65): 1000 mg via ORAL
  Filled 2020-10-28 (×68): qty 2

## 2020-10-28 MED ORDER — DEXMEDETOMIDINE (PRECEDEX) IN NS 20 MCG/5ML (4 MCG/ML) IV SYRINGE
PREFILLED_SYRINGE | INTRAVENOUS | Status: DC | PRN
  Administered 2020-10-28 (×2): 4 ug via INTRAVENOUS
  Administered 2020-10-28: 20 ug via INTRAVENOUS
  Administered 2020-10-28: 4 ug via INTRAVENOUS

## 2020-10-28 MED ORDER — MUPIROCIN 2 % EX OINT
TOPICAL_OINTMENT | Freq: Two times a day (BID) | CUTANEOUS | Status: AC
  Administered 2020-10-29: 1 via NASAL
  Filled 2020-10-28 (×2): qty 22

## 2020-10-28 MED ORDER — FENTANYL CITRATE (PF) 250 MCG/5ML IJ SOLN
INTRAMUSCULAR | Status: AC
  Filled 2020-10-28: qty 5

## 2020-10-28 MED ORDER — LABETALOL HCL 5 MG/ML IV SOLN
INTRAVENOUS | Status: AC
  Filled 2020-10-28: qty 4

## 2020-10-28 MED ORDER — OXYCODONE HCL 5 MG PO TABS: 5.0000 mg | ORAL_TABLET | ORAL | Status: DC | PRN

## 2020-10-28 MED ORDER — HYDROMORPHONE HCL 1 MG/ML IJ SOLN
INTRAMUSCULAR | Status: AC
  Filled 2020-10-28: qty 0.5

## 2020-10-28 MED ORDER — CEFAZOLIN SODIUM-DEXTROSE 2-4 GM/100ML-% IV SOLN
2.0000 g | Freq: Three times a day (TID) | INTRAVENOUS | Status: AC
  Administered 2020-10-29 (×3): 2 g via INTRAVENOUS
  Filled 2020-10-28 (×3): qty 100

## 2020-10-28 MED ORDER — LIDOCAINE 2% (20 MG/ML) 5 ML SYRINGE
INTRAMUSCULAR | Status: DC | PRN
  Administered 2020-10-28: 100 mg via INTRAVENOUS

## 2020-10-28 MED ORDER — ROCURONIUM BROMIDE 10 MG/ML (PF) SYRINGE
PREFILLED_SYRINGE | INTRAVENOUS | Status: DC | PRN
  Administered 2020-10-28: 20 mg via INTRAVENOUS
  Administered 2020-10-28: 70 mg via INTRAVENOUS
  Administered 2020-10-28: 50 mg via INTRAVENOUS
  Administered 2020-10-28: 30 mg via INTRAVENOUS
  Administered 2020-10-28 (×2): 20 mg via INTRAVENOUS
  Administered 2020-10-28: 30 mg via INTRAVENOUS
  Administered 2020-10-28: 20 mg via INTRAVENOUS

## 2020-10-28 MED ORDER — DEXAMETHASONE SODIUM PHOSPHATE 10 MG/ML IJ SOLN
INTRAMUSCULAR | Status: AC
  Filled 2020-10-28: qty 1

## 2020-10-28 MED ORDER — MIDAZOLAM HCL 2 MG/2ML IJ SOLN
INTRAMUSCULAR | Status: DC | PRN
  Administered 2020-10-28: 2 mg via INTRAVENOUS

## 2020-10-28 MED ORDER — ASCORBIC ACID 500 MG PO TABS
1000.0000 mg | ORAL_TABLET | Freq: Every day | ORAL | Status: DC
  Administered 2020-10-29 – 2020-11-15 (×18): 1000 mg via ORAL
  Filled 2020-10-28 (×18): qty 2

## 2020-10-28 MED ORDER — FENTANYL CITRATE (PF) 100 MCG/2ML IJ SOLN
INTRAMUSCULAR | Status: AC
  Administered 2020-10-28: 50 ug via INTRAVENOUS
  Filled 2020-10-28: qty 2

## 2020-10-28 MED ORDER — ONDANSETRON HCL 4 MG/2ML IJ SOLN
INTRAMUSCULAR | Status: AC
  Filled 2020-10-28: qty 2

## 2020-10-28 MED ORDER — LIDOCAINE HCL (PF) 2 % IJ SOLN
INTRAMUSCULAR | Status: AC
  Filled 2020-10-28: qty 5

## 2020-10-28 SURGICAL SUPPLY — 144 items
APL SKNCLS STERI-STRIP NONHPOA (GAUZE/BANDAGES/DRESSINGS) ×2
BENZOIN TINCTURE PRP APPL 2/3 (GAUZE/BANDAGES/DRESSINGS) ×6 IMPLANT
BIT DRILL 1.5MM 95MM JCC (BIT) ×2 IMPLANT
BIT DRILL 2.5 X LONG (BIT) ×1
BIT DRILL 2.8 (BIT) ×1
BIT DRILL 2.8 QUICK RELEASE (BIT) ×1 IMPLANT
BIT DRILL 2.8X5 QR DISP (BIT) ×3 IMPLANT
BIT DRILL 4.2 (DRILL) ×2 IMPLANT
BIT DRILL CALIBRATED 4.2 (BIT) ×1 IMPLANT
BIT DRILL CANN QC 2.8X165 (BIT) ×1 IMPLANT
BIT DRILL QC 1.8X125 (BIT) ×3 IMPLANT
BIT DRILL SHORT 4.2 (BIT) ×2 IMPLANT
BIT DRILL X LONG 2.5 (BIT) ×1 IMPLANT
BLADE SURG 10 STRL SS (BLADE) ×6 IMPLANT
BNDG CMPR 9X4 STRL LF SNTH (GAUZE/BANDAGES/DRESSINGS) ×1
BNDG CMPR STD VLCR NS LF 5.8X3 (GAUZE/BANDAGES/DRESSINGS) ×1
BNDG COHESIVE 4X5 TAN STRL (GAUZE/BANDAGES/DRESSINGS) ×6 IMPLANT
BNDG ELASTIC 3X5.8 VLCR NS LF (GAUZE/BANDAGES/DRESSINGS) ×3 IMPLANT
BNDG ELASTIC 3X5.8 VLCR STR LF (GAUZE/BANDAGES/DRESSINGS) ×3 IMPLANT
BNDG ELASTIC 4X5.8 VLCR STR LF (GAUZE/BANDAGES/DRESSINGS) ×6 IMPLANT
BNDG ELASTIC 6X5.8 VLCR STR LF (GAUZE/BANDAGES/DRESSINGS) ×3 IMPLANT
BNDG ESMARK 4X9 LF (GAUZE/BANDAGES/DRESSINGS) ×3 IMPLANT
BNDG GAUZE ELAST 4 BULKY (GAUZE/BANDAGES/DRESSINGS) ×3 IMPLANT
BONE CANC CHIPS 20CC PCAN1/4 (Bone Implant) ×3 IMPLANT
BRUSH SCRUB EZ PLAIN DRY (MISCELLANEOUS) ×6 IMPLANT
CHIPS CANC BONE 20CC PCAN1/4 (Bone Implant) ×1 IMPLANT
CLEANER TIP ELECTROSURG 2X2 (MISCELLANEOUS) ×3 IMPLANT
COVER MAYO STAND STRL (DRAPES) ×6 IMPLANT
COVER SURGICAL LIGHT HANDLE (MISCELLANEOUS) ×6 IMPLANT
DRAPE C-ARM 42X72 X-RAY (DRAPES) ×3 IMPLANT
DRAPE C-ARMOR (DRAPES) ×3 IMPLANT
DRAPE IMP U-DRAPE 54X76 (DRAPES) ×3 IMPLANT
DRAPE INCISE IOBAN 66X45 STRL (DRAPES) ×3 IMPLANT
DRAPE ORTHO SPLIT 77X108 STRL (DRAPES) ×6
DRAPE SURG ORHT 6 SPLT 77X108 (DRAPES) ×2 IMPLANT
DRAPE U-SHAPE 47X51 STRL (DRAPES) ×6 IMPLANT
DRESSING PREVENA PLUS CUSTOM (GAUZE/BANDAGES/DRESSINGS) ×2 IMPLANT
DRILL 2.8 QUICK RELEASE (BIT) ×3
DRILL 4.2 (DRILL) ×6
DRILL BIT 2.8MM (BIT) ×3
DRILL BIT CALIBRATED 4.2 (BIT) ×3
DRILL BIT QC 1.5MM (BIT) ×4
DRILL BIT SHORT 4.2 (BIT) ×6
DRILL BIT X LONG 2.5 (BIT) ×3
DRSG ADAPTIC 3X8 NADH LF (GAUZE/BANDAGES/DRESSINGS) ×3 IMPLANT
DRSG MEPILEX BORDER 4X4 (GAUZE/BANDAGES/DRESSINGS) ×3 IMPLANT
DRSG MEPILEX BORDER 4X8 (GAUZE/BANDAGES/DRESSINGS) ×3 IMPLANT
DRSG PAD ABDOMINAL 8X10 ST (GAUZE/BANDAGES/DRESSINGS) ×9 IMPLANT
DRSG PREVENA PLUS CUSTOM (GAUZE/BANDAGES/DRESSINGS) ×6
ELECT REM PT RETURN 9FT ADLT (ELECTROSURGICAL) ×3
ELECTRODE REM PT RTRN 9FT ADLT (ELECTROSURGICAL) ×1 IMPLANT
EVACUATOR 1/8 PVC DRAIN (DRAIN) IMPLANT
GAUZE SPONGE 4X4 12PLY STRL (GAUZE/BANDAGES/DRESSINGS) ×6 IMPLANT
GAUZE SPONGE 4X4 12PLY STRL LF (GAUZE/BANDAGES/DRESSINGS) ×6 IMPLANT
GAUZE XEROFORM 1X8 LF (GAUZE/BANDAGES/DRESSINGS) ×3 IMPLANT
GLOVE BIO SURGEON STRL SZ 6.5 (GLOVE) ×6 IMPLANT
GLOVE BIO SURGEON STRL SZ7 (GLOVE) ×6 IMPLANT
GLOVE BIO SURGEON STRL SZ7.5 (GLOVE) ×9 IMPLANT
GLOVE BIO SURGEON STRL SZ8 (GLOVE) ×15 IMPLANT
GLOVE BIO SURGEONS STRL SZ 6.5 (GLOVE) ×3
GLOVE BIOGEL PI IND STRL 6.5 (GLOVE) ×1 IMPLANT
GLOVE BIOGEL PI IND STRL 7.0 (GLOVE) ×2 IMPLANT
GLOVE BIOGEL PI IND STRL 7.5 (GLOVE) ×2 IMPLANT
GLOVE BIOGEL PI IND STRL 8 (GLOVE) ×1 IMPLANT
GLOVE BIOGEL PI INDICATOR 6.5 (GLOVE) ×2
GLOVE BIOGEL PI INDICATOR 7.0 (GLOVE) ×4
GLOVE BIOGEL PI INDICATOR 7.5 (GLOVE) ×4
GLOVE BIOGEL PI INDICATOR 8 (GLOVE) ×2
GLOVE SURG UNDER POLY LF SZ7 (GLOVE) ×3 IMPLANT
GOWN STRL REUS W/ TWL LRG LVL3 (GOWN DISPOSABLE) ×3 IMPLANT
GOWN STRL REUS W/ TWL XL LVL3 (GOWN DISPOSABLE) ×1 IMPLANT
GOWN STRL REUS W/TWL LRG LVL3 (GOWN DISPOSABLE) ×9
GOWN STRL REUS W/TWL XL LVL3 (GOWN DISPOSABLE) ×3
GUIDEWIRE 3.2X400 (WIRE) ×3 IMPLANT
KIT BASIN OR (CUSTOM PROCEDURE TRAY) ×3 IMPLANT
KIT DRSG PREVENA PLUS 7DAY 125 (MISCELLANEOUS) ×3 IMPLANT
KIT TURNOVER KIT B (KITS) ×3 IMPLANT
NAIL FEM IM DIST 10X400 (Nail) ×3 IMPLANT
NS IRRIG 1000ML POUR BTL (IV SOLUTION) ×9 IMPLANT
PACK ORTHO EXTREMITY (CUSTOM PROCEDURE TRAY) ×3 IMPLANT
PACK UNIVERSAL I (CUSTOM PROCEDURE TRAY) ×3 IMPLANT
PAD ARMBOARD 7.5X6 YLW CONV (MISCELLANEOUS) ×6 IMPLANT
PAD CAST 3X4 CTTN HI CHSV (CAST SUPPLIES) ×2 IMPLANT
PAD CAST 4YDX4 CTTN HI CHSV (CAST SUPPLIES) ×1 IMPLANT
PADDING CAST COTTON 3X4 STRL (CAST SUPPLIES) ×6
PADDING CAST COTTON 4X4 STRL (CAST SUPPLIES) ×3
PLATE LC CDP 2.4 4H (Plate) ×3 IMPLANT
PLATE LC DCP 2.0 6H (Plate) ×3 IMPLANT
PLATE LCP 16 HOLE 3.5 (Plate) ×3 IMPLANT
PLATE ULNA MIDSHAFT 8 HOLE (Plate) ×3 IMPLANT
PLATE VOLAR RADIS MIDSHAFT 6 H (Plate) ×3 IMPLANT
REAMER ROD DEEP FLUTE 2.5X950 (INSTRUMENTS) ×3 IMPLANT
SCREW CORTEX 2.0 24MM (Screw) ×6 IMPLANT
SCREW CORTEX 2.4 10MM (Screw) ×3 IMPLANT
SCREW CORTEX 2.4 24MM (Screw) ×3 IMPLANT
SCREW CORTEX 2.4X12 (Screw) ×6 IMPLANT
SCREW CORTEX 3.5 22MM (Screw) ×2 IMPLANT
SCREW CORTEX 3.5 26MM (Screw) ×4 IMPLANT
SCREW CORTEX 3.5 30MM (Screw) ×2 IMPLANT
SCREW CORTEX 3.5 32MM (Screw) ×2 IMPLANT
SCREW CORTEX ST 2.0X12 (Screw) ×6 IMPLANT
SCREW CORTICAL 3.5X20MM (Screw) ×3 IMPLANT
SCREW HEXALOBE NON-LOCK 3.5X14 (Screw) ×15 IMPLANT
SCREW HEXALOBE NON-LOCK 3.5X16 (Screw) ×9 IMPLANT
SCREW LOCK CORT ST 3.5X22 (Screw) ×1 IMPLANT
SCREW LOCK CORT ST 3.5X26 (Screw) ×2 IMPLANT
SCREW LOCK CORT ST 3.5X30 (Screw) ×1 IMPLANT
SCREW LOCK CORT ST 3.5X32 (Screw) ×1 IMPLANT
SCREW LOCK IM NAIL 5X34 (Screw) ×9 IMPLANT
SCREW LOCK IM NAIL 5X68 (Screw) ×3 IMPLANT
SCREW LOCK IM NAIL 5X84 (Screw) ×6 IMPLANT
SCREW LOCK IM X25 68X5X STRL (Screw) ×1 IMPLANT
SCREW LOCK T15 FT 24X3.5X2.9X (Screw) ×1 IMPLANT
SCREW LOCK T15 FT 30X3.5X2.9X (Screw) ×3 IMPLANT
SCREW LOCKING 3.5X24 (Screw) ×3 IMPLANT
SCREW LOCKING 3.5X26 (Screw) ×3 IMPLANT
SCREW LOCKING 3.5X30 (Screw) ×9 IMPLANT
SCREW NON LOCKING HEX 3.5X18MM (Screw) ×6 IMPLANT
SCREW NONLOCK HEX 3.5X12 (Screw) ×3 IMPLANT
SOL PREP POV-IOD 4OZ 10% (MISCELLANEOUS) ×9 IMPLANT
SOL PREP PROV IODINE SCRUB 4OZ (MISCELLANEOUS) ×6 IMPLANT
SPLINT FIBERGLASS 4X15 (CAST SUPPLIES) ×3 IMPLANT
SPONGE LAP 18X18 RF (DISPOSABLE) ×9 IMPLANT
STAPLER VISISTAT 35W (STAPLE) ×3 IMPLANT
STOCKINETTE IMPERVIOUS LG (DRAPES) ×6 IMPLANT
SUCTION FRAZIER HANDLE 10FR (MISCELLANEOUS) ×4
SUCTION TUBE FRAZIER 10FR DISP (MISCELLANEOUS) ×2 IMPLANT
SUT ETHILON 2 0 FS 18 (SUTURE) ×9 IMPLANT
SUT ETHILON 3 0 PS 1 (SUTURE) ×6 IMPLANT
SUT PDS AB 2-0 CT1 27 (SUTURE) IMPLANT
SUT VIC AB 0 CT1 27 (SUTURE) ×6
SUT VIC AB 0 CT1 27XBRD ANBCTR (SUTURE) ×2 IMPLANT
SUT VIC AB 1 CT1 27 (SUTURE) ×3
SUT VIC AB 1 CT1 27XBRD ANBCTR (SUTURE) ×1 IMPLANT
SUT VIC AB 2-0 CT1 27 (SUTURE) ×15
SUT VIC AB 2-0 CT1 TAPERPNT 27 (SUTURE) ×5 IMPLANT
SUT VIC AB 2-0 CT3 27 (SUTURE) ×6 IMPLANT
TOWEL GREEN STERILE (TOWEL DISPOSABLE) ×6 IMPLANT
TOWEL GREEN STERILE FF (TOWEL DISPOSABLE) ×12 IMPLANT
TRAY FOLEY W/BAG SLVR 14FR LF (SET/KITS/TRAYS/PACK) ×3 IMPLANT
TUBE CONNECTING 12'X1/4 (SUCTIONS) ×1
TUBE CONNECTING 12X1/4 (SUCTIONS) ×2 IMPLANT
WATER STERILE IRR 1000ML POUR (IV SOLUTION) ×3 IMPLANT
YANKAUER SUCT BULB TIP NO VENT (SUCTIONS) ×3 IMPLANT

## 2020-10-28 NOTE — Plan of Care (Signed)

## 2020-10-28 NOTE — Anesthesia Preprocedure Evaluation (Signed)
Anesthesia Evaluation  Patient identified by MRN, date of birth, ID band Patient awake    Reviewed: Allergy & Precautions, NPO status , Patient's Chart, lab work & pertinent test results  Airway Mallampati: I  TM Distance: >3 FB Neck ROM: Full    Dental   Pulmonary Current Smoker,    Pulmonary exam normal        Cardiovascular Normal cardiovascular exam     Neuro/Psych    GI/Hepatic   Endo/Other    Renal/GU      Musculoskeletal   Abdominal   Peds  Hematology   Anesthesia Other Findings   Reproductive/Obstetrics                             Anesthesia Physical Anesthesia Plan  ASA: II and emergent  Anesthesia Plan: General   Post-op Pain Management:    Induction: Intravenous, Rapid sequence and Cricoid pressure planned  PONV Risk Score and Plan: Ondansetron  Airway Management Planned: Oral ETT  Additional Equipment:   Intra-op Plan:   Post-operative Plan: Extubation in OR  Informed Consent: I have reviewed the patients History and Physical, chart, labs and discussed the procedure including the risks, benefits and alternatives for the proposed anesthesia with the patient or authorized representative who has indicated his/her understanding and acceptance.       Plan Discussed with: CRNA and Surgeon  Anesthesia Plan Comments:         Anesthesia Quick Evaluation

## 2020-10-28 NOTE — Transfer of Care (Signed)
Immediate Anesthesia Transfer of Care Note  Patient: Jason Alexander  Procedure(s) Performed: INTRAMEDULLARY (IM) RETROGRADE FEMORAL NAILING (Left Arm Lower) OPEN REDUCTION INTERNAL FIXATION (ORIF) HUMERAL SHAFT FRACTURE (Left Arm Lower) OPEN REDUCTION INTERNAL FIXATION (ORIF) RADIAL FRACTURE (Left Arm Lower)  Patient Location: PACU  Anesthesia Type:General  Level of Consciousness: awake, alert , oriented and patient cooperative  Airway & Oxygen Therapy: Patient Spontanous Breathing  Post-op Assessment: Report given to RN and Post -op Vital signs reviewed and stable  Post vital signs: Reviewed and stable  Last Vitals:  Vitals Value Taken Time  BP 151/99 10/28/20 2146  Temp    Pulse 115 10/28/20 2151  Resp 24 10/28/20 2151  SpO2 100 % 10/28/20 2151  Vitals shown include unvalidated device data.  Last Pain:  Vitals:   10/28/20 1239  TempSrc:   PainSc: Asleep         Complications: No complications documented.

## 2020-10-28 NOTE — Progress Notes (Signed)
Orthopedic Tech Progress Note Patient Details:  Jason Alexander 09-18-1976 [id number]  Ortho Devices Type of Ortho Device: Arm sling Ortho Device/Splint Location: Left Arm Ortho Device/Splint Interventions: Ordered,Delivered to PACU   Post Interventions Patient Tolerated: Well   Gerald Stabs 10/28/2020, 10:29 PM
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
## 2020-10-28 NOTE — Consult Note (Addendum)
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 hand.  Unrestricted range of motion left hip and knee   Reevaluate radial nerve function postoperatively to determine if any bracing is warranted and what type of bracing might be needed   Majority of these recover without any intervention but it can be a protracted recovery  - Pain management:  Multimodal   Will add neuropathic agent  - ABL anemia/Hemodynamics  Monitor  Repeat lactic acid  - Medical issues   Per primary  - DVT/PE prophylaxis:  Start anticoagulation postop  SCDs - ID:   Perioperative antibiotics - Metabolic Bone Disease:  Check basic labs - Activity:  Therapies postop  - FEN/GI prophylaxis/Foley/Lines:  Npo   Advance diet postop  - Impediments to  fracture healing:  Nicotine use   Marijuana use  - Dispo:  OR today to address numerous orthopedic injuries    Mearl Latin, PA-C [phone] (C) 10/28/2020, 9:05 AM  [facility] [address] [place] [address] [phone] Val [phone] (F)    After 5pm and on the weekends please log on to Amion, go to orthopaedics and the look under the [facility] Call for the provider(s) on call. You can also call our office at [phone] and then follow the prompts to be connected to the call team.

## 2020-10-28 NOTE — Progress Notes (Signed)
PT Cancellation Note  Patient Details Name: Jason Alexander MRN: [id number] DOB: [birth year]   Cancelled Treatment:    Reason Eval/Treat Not Completed: Medical issues which prohibited therapy;Patient not medically ready Patient L LE placed in Buck's traction. Noted plan for IMN of L LE and ORIF of L UE this AM. Will follow-up for PT evaluation s/p surgical interventions.  Gregor Hams, PT, DPT Acute Rehabilitation Services Pager [phone] Office [phone]   [name] 10/28/2020, 8:00 AM
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
## 2020-10-28 NOTE — Anesthesia Procedure Notes (Signed)
Procedure Name: Intubation Date/Time: 10/28/2020 3:26 PM Performed by: Dorthea Cove, CRNA Pre-anesthesia Checklist: Patient identified, Emergency Drugs available, Suction available and Patient being monitored Patient Re-evaluated:Patient Re-evaluated prior to induction Oxygen Delivery Method: Circle system utilized Preoxygenation: Pre-oxygenation with 100% oxygen Induction Type: IV induction Ventilation: Mask ventilation without difficulty Laryngoscope Size: Mac and 4 Grade View: Grade II Tube type: Oral Tube size: 7.5 mm Number of attempts: 1 Airway Equipment and Method: Stylet and Oral airway Placement Confirmation: ETT inserted through vocal cords under direct vision,  positive ETCO2 and breath sounds checked- equal and bilateral Secured at: 22 cm Tube secured with: Tape Dental Injury: Teeth and Oropharynx as per pre-operative assessment
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
## 2020-10-28 NOTE — Progress Notes (Signed)
OT Cancellation Note  Patient Details Name: Jason Alexander MRN: [id number] DOB: 1976-10-18   Cancelled Treatment:    Reason Eval/Treat Not Completed: Medical issues which prohibited therapy;Patient not medically ready. Pt L LE placed in Buck's traction. Noted plan for IMN of L LE and ORIF of L UE this AM. Will follow-up for OT evaluation s/p surgical interventions.   Lorre Munroe 10/28/2020, 7:17 AM

## 2020-10-29 ENCOUNTER — Encounter (HOSPITAL_COMMUNITY): Payer: Self-pay

## 2020-10-29 ENCOUNTER — Inpatient Hospital Stay (HOSPITAL_COMMUNITY): Payer: Medicaid Other

## 2020-10-29 DIAGNOSIS — S52352A Displaced comminuted fracture of shaft of radius, left arm, initial encounter for closed fracture: Secondary | ICD-10-CM

## 2020-10-29 DIAGNOSIS — G5632 Lesion of radial nerve, left upper limb: Secondary | ICD-10-CM

## 2020-10-29 DIAGNOSIS — S728X2A Other fracture of left femur, initial encounter for closed fracture: Secondary | ICD-10-CM | POA: Diagnosis not present

## 2020-10-29 DIAGNOSIS — S72352A Displaced comminuted fracture of shaft of left femur, initial encounter for closed fracture: Secondary | ICD-10-CM

## 2020-10-29 DIAGNOSIS — S52252A Displaced comminuted fracture of shaft of ulna, left arm, initial encounter for closed fracture: Secondary | ICD-10-CM

## 2020-10-29 DIAGNOSIS — E559 Vitamin D deficiency, unspecified: Secondary | ICD-10-CM | POA: Diagnosis present

## 2020-10-29 DIAGNOSIS — S52202A Unspecified fracture of shaft of left ulna, initial encounter for closed fracture: Secondary | ICD-10-CM | POA: Diagnosis not present

## 2020-10-29 DIAGNOSIS — S72302A Unspecified fracture of shaft of left femur, initial encounter for closed fracture: Secondary | ICD-10-CM | POA: Diagnosis not present

## 2020-10-29 DIAGNOSIS — D62 Acute posthemorrhagic anemia: Secondary | ICD-10-CM | POA: Diagnosis not present

## 2020-10-29 DIAGNOSIS — S42302A Unspecified fracture of shaft of humerus, left arm, initial encounter for closed fracture: Secondary | ICD-10-CM | POA: Diagnosis not present

## 2020-10-29 HISTORY — DX: Vitamin D deficiency, unspecified: E55.9

## 2020-10-29 HISTORY — DX: Displaced comminuted fracture of shaft of left femur, initial encounter for closed fracture: S72.352A

## 2020-10-29 HISTORY — DX: Displaced comminuted fracture of shaft of ulna, left arm, initial encounter for closed fracture: S52.252A

## 2020-10-29 HISTORY — DX: Displaced comminuted fracture of shaft of radius, left arm, initial encounter for closed fracture: S52.352A

## 2020-10-29 HISTORY — DX: Lesion of radial nerve, left upper limb: G56.32

## 2020-10-29 LAB — COMPREHENSIVE METABOLIC PANEL
ALT: 25 U/L (ref 0–44)
AST: 59 U/L — ABNORMAL HIGH (ref 15–41)
Albumin: 2.8 g/dL — ABNORMAL LOW (ref 3.5–5.0)
Alkaline Phosphatase: 52 U/L (ref 38–126)
Anion gap: 10 (ref 5–15)
BUN: 7 mg/dL (ref 6–20)
CO2: 24 mmol/L (ref 22–32)
Calcium: 8.6 mg/dL — ABNORMAL LOW (ref 8.9–10.3)
Chloride: 100 mmol/L (ref 98–111)
Creatinine, Ser: 1.08 mg/dL (ref 0.61–1.24)
GFR, Estimated: >60 mL/min (ref >60)
Glucose, Bld: 151 mg/dL — ABNORMAL HIGH (ref 70–99)
Potassium: 4.6 mmol/L (ref 3.5–5.1)
Sodium: 134 mmol/L — ABNORMAL LOW (ref 135–145)
Total Bilirubin: 0.6 mg/dL (ref 0.3–1.2)
Total Protein: 5.4 g/dL — ABNORMAL LOW (ref 6.5–8.1)

## 2020-10-29 LAB — CBC
HCT: 33.9 % — ABNORMAL LOW (ref 39.0–52.0)
Hemoglobin: 11.3 g/dL — ABNORMAL LOW (ref 13.0–17.0)
MCH: 31.2 pg (ref 26.0–34.0)
MCHC: 33.3 g/dL (ref 30.0–36.0)
MCV: 93.6 fL (ref 80.0–100.0)
Platelets: 243 10*3/uL (ref 150–400)
RBC: 3.62 MIL/uL — ABNORMAL LOW (ref 4.22–5.81)
RDW: 13.2 % (ref 11.5–15.5)
WBC: 12.2 10*3/uL — ABNORMAL HIGH (ref 4.0–10.5)
nRBC: 0 % (ref 0.0–0.2)

## 2020-10-29 LAB — VITAMIN D 25 HYDROXY (VIT D DEFICIENCY, FRACTURES): Vit D, 25-Hydroxy: 26.33 ng/mL — ABNORMAL LOW (ref 30–100)

## 2020-10-29 MED ORDER — METHOCARBAMOL 500 MG PO TABS
1000.0000 mg | ORAL_TABLET | Freq: Four times a day (QID) | ORAL | Status: DC
  Administered 2020-10-29 – 2020-11-05 (×27): 1000 mg via ORAL
  Filled 2020-10-29 (×29): qty 2

## 2020-10-29 MED ORDER — DULOXETINE HCL 30 MG PO CPEP
30.0000 mg | ORAL_CAPSULE | Freq: Every day | ORAL | Status: DC
  Administered 2020-10-29 – 2020-11-10 (×13): 30 mg via ORAL
  Filled 2020-10-29 (×13): qty 1

## 2020-10-29 MED ORDER — MORPHINE SULFATE (PF) 2 MG/ML IV SOLN
2.0000 mg | INTRAVENOUS | Status: DC | PRN
  Administered 2020-10-29: 3 mg via INTRAVENOUS
  Administered 2020-10-29 (×2): 2 mg via INTRAVENOUS
  Administered 2020-10-30: 4 mg via INTRAVENOUS
  Administered 2020-10-30 – 2020-10-31 (×6): 2 mg via INTRAVENOUS
  Filled 2020-10-29: qty 2
  Filled 2020-10-29: qty 1
  Filled 2020-10-29: qty 2
  Filled 2020-10-29 (×7): qty 1

## 2020-10-29 MED ORDER — LIDOCAINE 5 % EX PTCH
1.0000 | MEDICATED_PATCH | Freq: Every day | CUTANEOUS | Status: DC
  Administered 2020-10-29 – 2020-11-15 (×17): 1 via TRANSDERMAL
  Filled 2020-10-29 (×19): qty 1

## 2020-10-29 MED ORDER — GABAPENTIN 100 MG PO CAPS
100.0000 mg | ORAL_CAPSULE | Freq: Three times a day (TID) | ORAL | Status: DC
  Administered 2020-10-29: 100 mg via ORAL
  Filled 2020-10-29: qty 1

## 2020-10-29 MED ORDER — PREGABALIN 75 MG PO CAPS
75.0000 mg | ORAL_CAPSULE | Freq: Three times a day (TID) | ORAL | Status: DC
  Administered 2020-10-29 – 2020-11-03 (×17): 75 mg via ORAL
  Filled 2020-10-29 (×18): qty 1
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
## 2020-10-29 NOTE — Progress Notes (Signed)
Inpatient Rehab Admissions Coordinator Note:   Per PT recommendations, pt was screened for CIR candidacy by Estill Dooms, PT, DPT.  At this time we are recommending CIR consult and I will place an order per our protocol.  Please contact me with questions.   Estill Dooms, PT, DPT [phone] 10/29/20 2:01 PM
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
## 2020-10-30 DIAGNOSIS — S42302A Unspecified fracture of shaft of humerus, left arm, initial encounter for closed fracture: Secondary | ICD-10-CM | POA: Diagnosis not present

## 2020-10-30 DIAGNOSIS — F329 Major depressive disorder, single episode, unspecified: Secondary | ICD-10-CM | POA: Diagnosis not present

## 2020-10-30 DIAGNOSIS — D62 Acute posthemorrhagic anemia: Secondary | ICD-10-CM | POA: Diagnosis not present

## 2020-10-30 DIAGNOSIS — S728X2A Other fracture of left femur, initial encounter for closed fracture: Secondary | ICD-10-CM | POA: Diagnosis not present

## 2020-10-30 LAB — BASIC METABOLIC PANEL
Anion gap: 6 (ref 5–15)
BUN: 9 mg/dL (ref 6–20)
CO2: 30 mmol/L (ref 22–32)
Calcium: 8.2 mg/dL — ABNORMAL LOW (ref 8.9–10.3)
Chloride: 102 mmol/L (ref 98–111)
Creatinine, Ser: 0.84 mg/dL (ref 0.61–1.24)
GFR, Estimated: >60 mL/min (ref >60)
Glucose, Bld: 134 mg/dL — ABNORMAL HIGH (ref 70–99)
Potassium: 3.7 mmol/L (ref 3.5–5.1)
Sodium: 138 mmol/L (ref 135–145)

## 2020-10-30 LAB — CBC
HCT: 28.7 % — ABNORMAL LOW (ref 39.0–52.0)
Hemoglobin: 9.8 g/dL — ABNORMAL LOW (ref 13.0–17.0)
MCH: 32.3 pg (ref 26.0–34.0)
MCHC: 34.1 g/dL (ref 30.0–36.0)
MCV: 94.7 fL (ref 80.0–100.0)
Platelets: 209 10*3/uL (ref 150–400)
RBC: 3.03 MIL/uL — ABNORMAL LOW (ref 4.22–5.81)
RDW: 13.2 % (ref 11.5–15.5)
WBC: 7.8 10*3/uL (ref 4.0–10.5)
nRBC: 0 % (ref 0.0–0.2)

## 2020-10-30 NOTE — Plan of Care (Signed)
Pt complained of pain all day. Pain medications only seem to help a little bit. Tried to get patient out of the bed to ambulate today, but he complained of excruciating pain and could only get to edge of the bed. Not moving well at all. Will continue to monitor.   Problem: Education: Goal: Knowledge of General Education information will improve Description: Including pain rating scale, medication(s)/side effects and non-pharmacologic comfort measures Outcome: Progressing   Problem: Activity: Goal: Risk for activity intolerance will decrease Outcome: Not Progressing   Problem: Nutrition: Goal: Adequate nutrition will be maintained Outcome: Not Progressing   Problem: Coping: Goal: Level of anxiety will decrease Outcome: Not Progressing   Problem: Pain Managment: Goal: General experience of comfort will improve Outcome: Not Progressing   Problem: Safety: Goal: Ability to remain free from injury will improve Outcome: Not Progressing
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
## 2020-10-31 DIAGNOSIS — D62 Acute posthemorrhagic anemia: Secondary | ICD-10-CM | POA: Diagnosis not present

## 2020-10-31 DIAGNOSIS — S42302A Unspecified fracture of shaft of humerus, left arm, initial encounter for closed fracture: Secondary | ICD-10-CM | POA: Diagnosis not present

## 2020-10-31 DIAGNOSIS — F329 Major depressive disorder, single episode, unspecified: Secondary | ICD-10-CM | POA: Diagnosis not present

## 2020-10-31 DIAGNOSIS — S728X2A Other fracture of left femur, initial encounter for closed fracture: Secondary | ICD-10-CM | POA: Diagnosis not present

## 2020-10-31 LAB — CBC
HCT: 28.9 % — ABNORMAL LOW (ref 39.0–52.0)
Hemoglobin: 9.6 g/dL — ABNORMAL LOW (ref 13.0–17.0)
MCH: 31.5 pg (ref 26.0–34.0)
MCHC: 33.2 g/dL (ref 30.0–36.0)
MCV: 94.8 fL (ref 80.0–100.0)
Platelets: 231 10*3/uL (ref 150–400)
RBC: 3.05 MIL/uL — ABNORMAL LOW (ref 4.22–5.81)
RDW: 13.2 % (ref 11.5–15.5)
WBC: 8.1 10*3/uL (ref 4.0–10.5)
nRBC: 0 % (ref 0.0–0.2)

## 2020-10-31 LAB — BASIC METABOLIC PANEL
Anion gap: 10 (ref 5–15)
BUN: 10 mg/dL (ref 6–20)
CO2: 27 mmol/L (ref 22–32)
Calcium: 8.4 mg/dL — ABNORMAL LOW (ref 8.9–10.3)
Chloride: 99 mmol/L (ref 98–111)
Creatinine, Ser: 0.91 mg/dL (ref 0.61–1.24)
GFR, Estimated: >60 mL/min (ref >60)
Glucose, Bld: 120 mg/dL — ABNORMAL HIGH (ref 70–99)
Potassium: 3.8 mmol/L (ref 3.5–5.1)
Sodium: 136 mmol/L (ref 135–145)

## 2020-10-31 MED ORDER — OXYCODONE HCL 5 MG PO TABS
5.0000 mg | ORAL_TABLET | ORAL | Status: DC | PRN
  Administered 2020-10-31 – 2020-11-01 (×4): 10 mg via ORAL
  Filled 2020-10-31 (×4): qty 2

## 2020-10-31 MED ORDER — MORPHINE SULFATE (PF) 2 MG/ML IV SOLN
2.0000 mg | INTRAVENOUS | Status: DC | PRN
  Administered 2020-10-31 – 2020-11-01 (×3): 2 mg via INTRAVENOUS
  Filled 2020-10-31 (×3): qty 1

## 2020-10-31 NOTE — Plan of Care (Signed)
No acute changes since the previous night that I took care of him. New IV placed in right upper arm #20G. Pain being managed with prn meds as ordered. Will continue to monitor and continue POC.
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
## 2020-10-31 NOTE — Progress Notes (Signed)
[facility] Progress Note  3 Days Post-Op  Subjective: CC: No acute change. Feels sore.   Objective: Vital signs in last 24 hours: Temp:  [98.2 F (36.8 C)-99.1 F (37.3 C)] 99.1 F (37.3 C) (11/26 0719) Pulse Rate:  [88-96] 88 (11/26 0719) Resp:  [17-20] 17 (11/26 0354) BP: (141-181)/(77-87) 181/87 (11/26 0719) SpO2:  [93 %-100 %] 94 % (11/26 0719) Last BM Date: 10/27/20  Intake/Output from previous day: 11/25 0701 - 11/26 0700 In: -  Out: 700 [Urine:700] Intake/Output this shift: No intake/output data recorded.  PE: Gen:  Alert, NAD, laying in bed Card:  Regular rate and rhythm, pedal pulses 2+ BL Pulm:  Normal effort, clear to auscultation bilaterally Abd: Soft, non-tender, non-distended MSK: RUE and BLE non-tender and NVI, LUE with splint in place, small twitch of index finger but otherwise could not move fingers this morning.. Capillary refill <2. LLE with dressing c/d/i. Pedal pulses 2+ BL Skin: warm and dry, no rashes  Psych: A&Ox3    Lab Results:  Recent Labs    10/30/20 0050 10/31/20 0304  WBC 7.8 8.1  HGB 9.8* 9.6*  HCT 28.7* 28.9*  PLT 209 231   BMET Recent Labs    10/30/20 0050 10/31/20 0304  NA 138 136  K 3.7 3.8  CL 102 99  CO2 30 27  GLUCOSE 134* 120*  BUN 9 10  CREATININE 0.84 0.91  CALCIUM 8.2* 8.4*   PT/INR No results for input(s): LABPROT, INR in the last 72 hours. CMP     Component Value Date/Time   NA 136 10/31/2020 0304   K 3.8 10/31/2020 0304   CL 99 10/31/2020 0304   CO2 27 10/31/2020 0304   GLUCOSE 120 (H) 10/31/2020 0304   BUN 10 10/31/2020 0304   CREATININE 0.91 10/31/2020 0304   CALCIUM 8.4 (L) 10/31/2020 0304   PROT 5.4 (L) 10/29/2020 0254   ALBUMIN 2.8 (L) 10/29/2020 0254   AST 59 (H) 10/29/2020 0254   ALT 25 10/29/2020 0254   ALKPHOS 52 10/29/2020 0254   BILITOT 0.6 10/29/2020 0254   GFRNONAA >60 10/31/2020 0304   Lipase  No results found for: LIPASE     Studies/Results: DG Forearm  Left  Result Date: 10/29/2020 CLINICAL DATA:  Status post open reduction and internal fixation of left forearm fractures. EXAM: LEFT FOREARM - 2 VIEW COMPARISON:  None. FINDINGS: Status post surgical internal fixation of left radial and ulnar fractures. Good alignment of fracture components is noted. IMPRESSION: Status post surgical internal fixation of left radial and ulnar fractures. Electronically Signed   By: Lupita Raider M.D.   On: 10/29/2020 10:02   DG Humerus Left  Result Date: 10/29/2020 CLINICAL DATA:  Status post open reduction and internal fixation of left humeral fracture. EXAM: LEFT HUMERUS - 2+ VIEW COMPARISON:  None. FINDINGS: Status post surgical internal fixation of left humeral shaft fracture. Good alignment of fracture components is noted. IMPRESSION: Status post surgical internal fixation of left humeral shaft fracture. Electronically Signed   By: Lupita Raider M.D.   On: 10/29/2020 10:03   DG FEMUR PORT MIN 2 VIEWS LEFT  Result Date: 10/29/2020 CLINICAL DATA:  Status post open reduction internal fixation of left femur fracture. EXAM: LEFT FEMUR PORTABLE 2 VIEWS COMPARISON:  None. FINDINGS: Status post intramedullary rod fixation of left femoral shaft fracture. Good alignment of fracture components is noted. IMPRESSION: Status post intramedullary rod fixation of left femoral shaft fracture. Electronically Signed   By: Fayrene Fearing  Christen Butter M.D.   On: 10/29/2020 10:01    Anti-infectives: Anti-infectives (From admission, onward)   Start     Dose/Rate Route Frequency Ordered Stop   10/29/20 0400  ceFAZolin (ANCEF) IVPB 2g/100 mL premix        2 g 200 mL/hr over 30 Minutes Intravenous Every 8 hours 10/28/20 2353 10/29/20 2148     Assessment/Plan [facility] L humerus FX, L forearm FX - s/p ORIF left ulna and radius, ORIF left humerus 11/23 Dr. Carola Frost, WBAT through L elbow with platform walker L femur FX - s/p retrograde IMN 11/23 Dr. Carola Frost, WBAT LLE EtOH intoxication Tobacco  abuse ABL anemia - stable;. BP and HR are WNL. Repeat labs in AM.  Depression - resume home Cymbalta   FEN: regular diet ID: perioperative ancef per ortho VTE: SCD's, Lovenox Foley: out Dispo: med-surg, PT/OT evals, multimodal pain control.  Ortho to further eval possible radial nerve injury post-op. CIR evaluating   LOS: 4 days    Berna Bue MD [facility] Please see Amion for pager number during day hours 7:00am-4:30pm
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
## 2020-10-31 NOTE — Progress Notes (Signed)
Inpatient Rehabilitation Admissions Coordinator  Inpatient rehab consult received. I met with patient at bedside. He is homeless with no residence to d/c home. He therefore will need SNF rehab until able to be independent to return to prior living situation. I will notify TOC. We will sign off at this time.  Danne Baxter, RN, MSN Rehab Admissions Coordinator [phone] 10/31/2020 11:49 AM

## 2020-10-31 NOTE — Anesthesia Postprocedure Evaluation (Signed)
Anesthesia Post Note  Patient: Jason Alexander  Procedure(s) Performed: INTRAMEDULLARY (IM) RETROGRADE FEMORAL NAILING (Left Arm Lower) OPEN REDUCTION INTERNAL FIXATION (ORIF) HUMERAL SHAFT FRACTURE (Left Arm Lower) OPEN REDUCTION INTERNAL FIXATION (ORIF) RADIAL FRACTURE (Left Arm Lower)     Patient location during evaluation: PACU Anesthesia Type: General Level of consciousness: awake and alert Pain management: pain level controlled Vital Signs Assessment: post-procedure vital signs reviewed and stable Respiratory status: spontaneous breathing, nonlabored ventilation, respiratory function stable and patient connected to nasal cannula oxygen Cardiovascular status: blood pressure returned to baseline and stable Postop Assessment: no apparent nausea or vomiting Anesthetic complications: no   No complications documented.  Last Vitals:  Vitals:   10/31/20 0354 10/31/20 0719  BP: (!) 147/80 (!) 181/87  Pulse: 92 88  Resp: 17   Temp: 37.1 C 37.3 C  SpO2: 100% 94%    Last Pain:  Vitals:   10/31/20 0751  TempSrc:   PainSc: 8                  Adilyn Humes S
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
## 2020-11-01 DIAGNOSIS — S42302A Unspecified fracture of shaft of humerus, left arm, initial encounter for closed fracture: Secondary | ICD-10-CM | POA: Diagnosis not present

## 2020-11-01 DIAGNOSIS — D62 Acute posthemorrhagic anemia: Secondary | ICD-10-CM | POA: Diagnosis not present

## 2020-11-01 DIAGNOSIS — F329 Major depressive disorder, single episode, unspecified: Secondary | ICD-10-CM | POA: Diagnosis not present

## 2020-11-01 DIAGNOSIS — S728X2A Other fracture of left femur, initial encounter for closed fracture: Secondary | ICD-10-CM | POA: Diagnosis not present

## 2020-11-01 LAB — CBC
HCT: 29.2 % — ABNORMAL LOW (ref 39.0–52.0)
Hemoglobin: 9.7 g/dL — ABNORMAL LOW (ref 13.0–17.0)
MCH: 31.3 pg (ref 26.0–34.0)
MCHC: 33.2 g/dL (ref 30.0–36.0)
MCV: 94.2 fL (ref 80.0–100.0)
Platelets: 295 10*3/uL (ref 150–400)
RBC: 3.1 MIL/uL — ABNORMAL LOW (ref 4.22–5.81)
RDW: 13.3 % (ref 11.5–15.5)
WBC: 8 10*3/uL (ref 4.0–10.5)
nRBC: 0 % (ref 0.0–0.2)

## 2020-11-01 MED ORDER — OXYCODONE HCL 5 MG PO TABS
5.0000 mg | ORAL_TABLET | Freq: Four times a day (QID) | ORAL | Status: DC | PRN
  Administered 2020-11-01 – 2020-11-03 (×7): 10 mg via ORAL
  Filled 2020-11-01 (×8): qty 2

## 2020-11-01 MED ORDER — MORPHINE SULFATE (PF) 2 MG/ML IV SOLN
2.0000 mg | Freq: Four times a day (QID) | INTRAVENOUS | Status: DC | PRN
  Administered 2020-11-01 – 2020-11-02 (×2): 2 mg via INTRAVENOUS
  Filled 2020-11-01 (×2): qty 1

## 2020-11-01 NOTE — Plan of Care (Signed)
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
## 2020-11-01 NOTE — Progress Notes (Signed)
Patient is tearful and expressing hopelessness about his current situation. Active listening and positive reinforcement were offered. Chaplain was also consulted.

## 2020-11-01 NOTE — Plan of Care (Signed)
  Problem: Education: Goal: Knowledge of General Education information will improve Description Including pain rating scale, medication(s)/side effects and non-pharmacologic comfort measures Outcome: Progressing   Problem: Health Behavior/Discharge Planning: Goal: Ability to manage health-related needs will improve Outcome: Progressing   

## 2020-11-01 NOTE — Plan of Care (Signed)
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
## 2020-11-02 ENCOUNTER — Other Ambulatory Visit: Payer: Self-pay | Admitting: Family

## 2020-11-02 ENCOUNTER — Encounter (HOSPITAL_COMMUNITY): Payer: Self-pay | Admitting: Orthopedic Surgery

## 2020-11-02 DIAGNOSIS — S728X2A Other fracture of left femur, initial encounter for closed fracture: Secondary | ICD-10-CM | POA: Diagnosis not present

## 2020-11-02 DIAGNOSIS — F329 Major depressive disorder, single episode, unspecified: Secondary | ICD-10-CM | POA: Diagnosis not present

## 2020-11-02 DIAGNOSIS — D62 Acute posthemorrhagic anemia: Secondary | ICD-10-CM | POA: Diagnosis not present

## 2020-11-02 DIAGNOSIS — S42302A Unspecified fracture of shaft of humerus, left arm, initial encounter for closed fracture: Secondary | ICD-10-CM | POA: Diagnosis not present

## 2020-11-02 MED ORDER — POLYETHYLENE GLYCOL 3350 17 G PO PACK
17.0000 g | PACK | Freq: Every day | ORAL | Status: DC
  Administered 2020-11-02 – 2020-11-13 (×11): 17 g via ORAL
  Filled 2020-11-02 (×14): qty 1

## 2020-11-02 MED ORDER — HYDROMORPHONE HCL 1 MG/ML IJ SOLN
0.5000 mg | INTRAMUSCULAR | Status: DC | PRN
  Administered 2020-11-02 – 2020-11-03 (×5): 0.5 mg via INTRAVENOUS
  Filled 2020-11-02 (×5): qty 0.5
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
## 2020-11-02 NOTE — Progress Notes (Signed)
Pt able to use bedside commode and have BM

## 2020-11-02 NOTE — Plan of Care (Signed)

## 2020-11-03 ENCOUNTER — Encounter (HOSPITAL_COMMUNITY): Payer: Self-pay | Admitting: Orthopedic Surgery

## 2020-11-03 DIAGNOSIS — S728X2A Other fracture of left femur, initial encounter for closed fracture: Secondary | ICD-10-CM | POA: Diagnosis not present

## 2020-11-03 DIAGNOSIS — D62 Acute posthemorrhagic anemia: Secondary | ICD-10-CM | POA: Diagnosis not present

## 2020-11-03 DIAGNOSIS — S42302A Unspecified fracture of shaft of humerus, left arm, initial encounter for closed fracture: Secondary | ICD-10-CM | POA: Diagnosis not present

## 2020-11-03 MED ORDER — OXYCODONE HCL 5 MG PO TABS
5.0000 mg | ORAL_TABLET | ORAL | Status: DC | PRN
  Administered 2020-11-03 – 2020-11-13 (×40): 10 mg via ORAL
  Administered 2020-11-13: 5 mg via ORAL
  Administered 2020-11-13 – 2020-11-15 (×7): 10 mg via ORAL
  Filled 2020-11-03 (×48): qty 2

## 2020-11-03 MED ORDER — HYDROXYZINE HCL 25 MG PO TABS
25.0000 mg | ORAL_TABLET | Freq: Two times a day (BID) | ORAL | Status: DC | PRN
  Administered 2020-11-09 – 2020-11-15 (×12): 25 mg via ORAL
  Filled 2020-11-03 (×12): qty 1

## 2020-11-03 MED ORDER — TRAMADOL HCL 50 MG PO TABS
50.0000 mg | ORAL_TABLET | Freq: Four times a day (QID) | ORAL | Status: DC
  Administered 2020-11-03 – 2020-11-15 (×36): 50 mg via ORAL
  Filled 2020-11-03 (×39): qty 1

## 2020-11-03 MED ORDER — QUETIAPINE FUMARATE 25 MG PO TABS
50.0000 mg | ORAL_TABLET | Freq: Every day | ORAL | Status: DC
  Administered 2020-11-03 – 2020-11-14 (×12): 50 mg via ORAL
  Filled 2020-11-03 (×12): qty 2

## 2020-11-03 MED ORDER — HYDROMORPHONE HCL 1 MG/ML IJ SOLN
0.5000 mg | Freq: Four times a day (QID) | INTRAMUSCULAR | Status: DC | PRN
  Administered 2020-11-03 – 2020-11-05 (×6): 0.5 mg via INTRAVENOUS
  Filled 2020-11-03 (×6): qty 0.5
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
## 2020-11-03 NOTE — Plan of Care (Signed)
  Problem: Education: Goal: Knowledge of General Education information will improve Description: Including pain rating scale, medication(s)/side effects and non-pharmacologic comfort measures Outcome: Progressing   Problem: Activity: Goal: Risk for activity intolerance will decrease Outcome: Progressing   

## 2020-11-03 NOTE — Progress Notes (Signed)
[facility] Progress Note  6 Days Post-Op  Subjective: CC-  Continues to have a lot of pain in the LUE and LLE. Switched from morphine to dilaudid yesterday for breakthrough pain which is helping. He took dilaudid 4x yesterday and oxycodone 3x. Tolerating diet but not eating much. No appetite. BM yesterday. Met with psychiatry yesterday who is recommending inpatient psychiatric admission.  Objective: Vital signs in last 24 hours: Temp:  [98 F (36.7 C)-98.9 F (37.2 C)] 98.3 F (36.8 C) (11/29 0726) Pulse Rate:  [90-95] 90 (11/29 0726) Resp:  [12-18] 18 (11/29 0726) BP: (147-176)/(81-91) 147/91 (11/29 0726) SpO2:  [96 %-100 %] 96 % (11/29 0726) Last BM Date: 11/02/20  Intake/Output from previous day: 11/28 0701 - 11/29 0700 In: -  Out: 775 [Urine:775] Intake/Output this shift: No intake/output data recorded.  PE: Gen:  Alert, NAD, pleasant HEENT: EOM's intact, pupils equal and round Card:  RRR, no M/G/R heard, 2+ DP pulses Pulm:  CTAB, no W/R/R, rate and effort normal Abd: Soft, NT/ND, +BS, no HSM Ext: prevena to left upper arm. Splint to left wrist. LLE sutures cdi, compartments soft Psych: A&Ox4  Skin: no rashes noted, warm and dry  Lab Results:  Recent Labs    11/01/20 0153  WBC 8.0  HGB 9.7*  HCT 29.2*  PLT 295   BMET No results for input(s): NA, K, CL, CO2, GLUCOSE, BUN, CREATININE, CALCIUM in the last 72 hours. PT/INR No results for input(s): LABPROT, INR in the last 72 hours. CMP     Component Value Date/Time   NA 136 10/31/2020 0304   K 3.8 10/31/2020 0304   CL 99 10/31/2020 0304   CO2 27 10/31/2020 0304   GLUCOSE 120 (H) 10/31/2020 0304   BUN 10 10/31/2020 0304   CREATININE 0.91 10/31/2020 0304   CALCIUM 8.4 (L) 10/31/2020 0304   PROT 5.4 (L) 10/29/2020 0254   ALBUMIN 2.8 (L) 10/29/2020 0254   AST 59 (H) 10/29/2020 0254   ALT 25 10/29/2020 0254   ALKPHOS 52 10/29/2020 0254   BILITOT 0.6 10/29/2020 0254   GFRNONAA >60  10/31/2020 0304   Lipase  No results found for: LIPASE     Studies/Results: No results found.  Anti-infectives: Anti-infectives (From admission, onward)   Start     Dose/Rate Route Frequency Ordered Stop   10/29/20 0400  ceFAZolin (ANCEF) IVPB 2g/100 mL premix        2 g 200 mL/hr over 30 Minutes Intravenous Every 8 hours 10/28/20 2353 10/29/20 2148       Assessment/Plan [facility] L humerus FX, L forearm FX - s/p ORIF left ulna and radius, ORIF left humerus 11/23 Dr. Marcelino Scot, WBAT through L elbow with platform walker, ROM as tolerated except active shoulder abduction. Plan to d/c prevena 11/30 and d/c volar splint in 2 weeks L radial nerve palsy - per ortho, visualized intra-op, intact, aggressive PROM L femur FX - s/p retrograde IMN 11/23 Dr. Marcelino Scot, WBAT LLE EtOH intoxication Tobacco abuse THC use ABL anemia - stable Depression - seen by psych 11/28 who recommends inpatient psych admission when medically cleared/ patient agrees. Continue home Cymbalta, restart seroquel nightly, hydroxyzine BID PRN anxiety  FEN: regular diet ID: perioperative ancef per ortho VTE: SCD's, Lovenox Foley: out Dispo: Wean IV pain medication and increase oxy 5-75m q4hr PRN in addition to scheduled nonnarcotics. Continue therapies. TOC consult for assistance with inpatient psych admission.    LOS: 7 days    [facility] 11/03/2020, 8:13 AM Please see Amion for pager number during day hours 7:00am-4:30pm

## 2020-11-03 NOTE — Progress Notes (Signed)
Inpatient Rehabilitation Admissions Coordinator  Noted inpatient psych recommended per consult. I will not pursue CIR admit at this time. Discussed with TOC, Raynelle Fanning.  Ottie Glazier, RN, MSN Rehab Admissions Coordinator [phone] 11/03/2020 1:13 PM
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
## 2020-11-04 DIAGNOSIS — S728X2A Other fracture of left femur, initial encounter for closed fracture: Secondary | ICD-10-CM | POA: Diagnosis not present

## 2020-11-04 DIAGNOSIS — S42302A Unspecified fracture of shaft of humerus, left arm, initial encounter for closed fracture: Secondary | ICD-10-CM | POA: Diagnosis not present

## 2020-11-04 DIAGNOSIS — D62 Acute posthemorrhagic anemia: Secondary | ICD-10-CM | POA: Diagnosis not present

## 2020-11-04 MED ORDER — PREGABALIN 100 MG PO CAPS
100.0000 mg | ORAL_CAPSULE | Freq: Three times a day (TID) | ORAL | Status: DC
  Administered 2020-11-04 – 2020-11-15 (×34): 100 mg via ORAL
  Filled 2020-11-04 (×28): qty 1
  Filled 2020-11-04: qty 4
  Filled 2020-11-04 (×6): qty 1
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
## 2020-11-05 DIAGNOSIS — S42302A Unspecified fracture of shaft of humerus, left arm, initial encounter for closed fracture: Secondary | ICD-10-CM | POA: Diagnosis not present

## 2020-11-05 DIAGNOSIS — Z419 Encounter for procedure for purposes other than remedying health state, unspecified: Secondary | ICD-10-CM | POA: Diagnosis not present

## 2020-11-05 DIAGNOSIS — D62 Acute posthemorrhagic anemia: Secondary | ICD-10-CM | POA: Diagnosis not present

## 2020-11-05 DIAGNOSIS — S728X2A Other fracture of left femur, initial encounter for closed fracture: Secondary | ICD-10-CM | POA: Diagnosis not present

## 2020-11-05 MED ORDER — HYDROMORPHONE HCL 1 MG/ML IJ SOLN
0.5000 mg | Freq: Three times a day (TID) | INTRAMUSCULAR | Status: DC | PRN
  Administered 2020-11-05 – 2020-11-06 (×3): 0.5 mg via INTRAVENOUS
  Filled 2020-11-05 (×3): qty 0.5

## 2020-11-05 MED ORDER — CYCLOBENZAPRINE HCL 5 MG PO TABS
5.0000 mg | ORAL_TABLET | Freq: Three times a day (TID) | ORAL | Status: DC
  Administered 2020-11-05 – 2020-11-15 (×31): 5 mg via ORAL
  Filled 2020-11-05 (×32): qty 1

## 2020-11-05 NOTE — Progress Notes (Signed)
[facility] Progress Note  8 Days Post-Op  Subjective: CC-  Main complaint this morning is muscle spasms in the LUE. States that when the spasm occurs it makes his pain severely worse.  Otherwise doing ok. No more orthostatic hypotensive episodes. Needed +41modA for stand pivot transfer.  Objective: Vital signs in last 24 hours: Temp:  [97.6 F (36.4 C)-98.8 F (37.1 C)] 97.7 F (36.5 C) (12/01 0843) Pulse Rate:  [82-100] 82 (12/01 0843) Resp:  [16-20] 16 (12/01 0832) BP: (116-144)/(66-85) 116/83 (12/01 0843) SpO2:  [95 %-100 %] 100 % (12/01 0843) Last BM Date: 11/02/20  Intake/Output from previous day: 11/30 0701 - 12/01 0700 In: 720 [P.O.:720] Out: 1050 [Urine:1050] Intake/Output this shift: No intake/output data recorded.  PE: Gen: Alert, NAD, pleasant Card: RRR, no M/G/R heard, 2+ DP pulses Pulm: CTAB, no W/R/R, rate and effort normal Abd: Soft, NT/ND, +BS, no HSM Ext:cdi dressing to left upper arm. Splint to left wrist. LLE sutures cdi, compartments soft Psych: A&Ox4  Skin: no rashes noted, warm and dry    Lab Results:  No results for input(s): WBC, HGB, HCT, PLT in the last 72 hours. BMET No results for input(s): NA, K, CL, CO2, GLUCOSE, BUN, CREATININE, CALCIUM in the last 72 hours. PT/INR No results for input(s): LABPROT, INR in the last 72 hours. CMP     Component Value Date/Time   NA 136 10/31/2020 0304   K 3.8 10/31/2020 0304   CL 99 10/31/2020 0304   CO2 27 10/31/2020 0304   GLUCOSE 120 (H) 10/31/2020 0304   BUN 10 10/31/2020 0304   CREATININE 0.91 10/31/2020 0304   CALCIUM 8.4 (L) 10/31/2020 0304   PROT 5.4 (L) 10/29/2020 0254   ALBUMIN 2.8 (L) 10/29/2020 0254   AST 59 (H) 10/29/2020 0254   ALT 25 10/29/2020 0254   ALKPHOS 52 10/29/2020 0254   BILITOT 0.6 10/29/2020 0254   GFRNONAA >60 10/31/2020 0304   Lipase  No results found for: LIPASE     Studies/Results: No results found.  Anti-infectives: Anti-infectives (From  admission, onward)   Start     Dose/Rate Route Frequency Ordered Stop   10/29/20 0400  ceFAZolin (ANCEF) IVPB 2g/100 mL premix        2 g 200 mL/hr over 30 Minutes Intravenous Every 8 hours 10/28/20 2353 10/29/20 2148       Assessment/Plan [facility] L humerus FX, L forearm FX - s/p ORIF left ulna and radius, ORIF left humerus 11/23 Dr. Carola Frost, WBAT through L elbow with platform walker, ROM as tolerated except active shoulder abduction. Prevena d/c-ed 11/30, plan to d/c volar wrist splint in 1 wee L radial nerve palsy - per ortho, visualized intra-op, intact, aggressive PROM L femur FX - s/p retrograde IMN 11/23 Dr. Carola Frost, WBAT LLE EtOH intoxication Tobacco abuse THC use ABL anemia - stable (11/27) Depression-seen by  psych 11/28 who recommends inpatient psych admission when medically cleared/ patient agrees. ContinueCymbalta, seroquel nightly, hydroxyzine BID PRN anxiety  FEN: regular diet ID: perioperative ancef per ortho VTE: SCD's, Lovenox (ortho rec 30 days) Foley: out  Dispo:Change robaxin to flexeril. Continue therapies. TOC consult for assistance with inpatient psych admission. Medically stable for discharge once bed available. Weaned dilaudid to q8 PRN, will continue to wean off over next 1-2 days.   LOS: 9 days    Franne Forts, [facility] 11/05/2020, 9:58 AM Please see Amion for pager number during day hours 7:00am-4:30pm
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
## 2020-11-06 DIAGNOSIS — F329 Major depressive disorder, single episode, unspecified: Secondary | ICD-10-CM | POA: Diagnosis not present

## 2020-11-06 DIAGNOSIS — S728X2A Other fracture of left femur, initial encounter for closed fracture: Secondary | ICD-10-CM | POA: Diagnosis not present

## 2020-11-06 DIAGNOSIS — D62 Acute posthemorrhagic anemia: Secondary | ICD-10-CM | POA: Diagnosis not present

## 2020-11-06 DIAGNOSIS — S42302A Unspecified fracture of shaft of humerus, left arm, initial encounter for closed fracture: Secondary | ICD-10-CM | POA: Diagnosis not present

## 2020-11-06 MED ORDER — HYDROMORPHONE HCL 1 MG/ML IJ SOLN
0.2500 mg | Freq: Two times a day (BID) | INTRAMUSCULAR | Status: DC | PRN
  Administered 2020-11-07 – 2020-11-11 (×7): 0.25 mg via INTRAVENOUS
  Filled 2020-11-06 (×9): qty 0.5

## 2020-11-06 NOTE — TOC Progression Note (Signed)
Transition of Care [facility]) - Progression Note    Patient Details  Name: Jason Alexander MRN: [id number] Date of Birth: 18-Jun-1976  [facility] CM/SW Contact  Glennon Mac, RN Phone Number: 11/06/2020, 11:44 AM  Clinical Narrative: Referrals made to [facility] and [facility] on 12/1; [facility] currently has no bed availability.  [facility] states case currently still in review.  Will attempt to get an update from [facility] today, fax out to additional area psych facility.      Expected Discharge Plan: Psychiatric Hospital Barriers to Discharge: Continued Medical Work up  Expected Discharge Plan and Services Expected Discharge Plan: Psychiatric Hospital   Discharge Planning Services: CM Consult   Living arrangements for the past 2 months: Homeless                                       Social Determinants of Health (SDOH) Interventions    Readmission Risk Interventions No flowsheet data found.  Quintella Baton, RN, BSN  Trauma/Neuro ICU Case Manager [phone]

## 2020-11-06 NOTE — TOC Progression Note (Signed)
Transition of Care [facility]) - Progression Note    Patient Details  Name: Jason Alexander MRN: [id number] Date of Birth: December 11, 1975  Transition of Care [facility] CM/SW Contact  Glennon Mac, RN Phone Number: 11/06/2020, 4:36 PM  Clinical Narrative: Patient information faxed to [facility] psychiatric facility for review.  Received update from [facility]; case is still being reviewed, but currently facility does not have appropriate bed for patient. Will continue to follow with updates as they are available.  Recommend weekly psychiatric physician consults to evaluate for continued need for inpatient psychiatric admission.    Expected Discharge Plan: Psychiatric Hospital Barriers to Discharge: Continued Medical Work up  Expected Discharge Plan and Services Expected Discharge Plan: Psychiatric Hospital   Discharge Planning Services: CM Consult   Living arrangements for the past 2 months: Homeless                                       Social Determinants of Health (SDOH) Interventions    Readmission Risk Interventions No flowsheet data found.  Quintella Baton, RN, BSN  Trauma/Neuro ICU Case Manager [phone]

## 2020-11-06 NOTE — Plan of Care (Signed)

## 2020-11-06 NOTE — Progress Notes (Signed)
[facility] Progress Note  9 Days Post-Op  Subjective: CC-  Tired this morning but overall doing ok. Feels about the same. Flexeril helped with spasms but he still has a lot of pain in the LUE. Working well with therapies.  Objective: Vital signs in last 24 hours: Temp:  [97.9 F (36.6 C)-98.5 F (36.9 C)] 98.3 F (36.8 C) (12/02 0809) Pulse Rate:  [78-93] 78 (12/02 0809) Resp:  [15-16] 16 (12/02 0809) BP: (125-145)/(73-90) 145/90 (12/02 0809) SpO2:  [95 %-98 %] 96 % (12/02 0809) Last BM Date: 11/02/20  Intake/Output from previous day: 12/01 0701 - 12/02 0700 In: -  Out: 350 [Urine:350] Intake/Output this shift: No intake/output data recorded.  PE: Gen: Alert, NAD, pleasant Card: RRR, no M/G/R heard, 2+ DP pulses Pulm: CTAB, no W/R/R, rate and effort normal Abd: Soft, NT/ND, +BS, no HSM Ext:cdi dressing to left upper arm. Splint to left wrist. LLE sutures cdi, compartments soft Psych: A&Ox4  Skin: no rashes noted, warm and dry  Lab Results:  No results for input(s): WBC, HGB, HCT, PLT in the last 72 hours. BMET No results for input(s): NA, K, CL, CO2, GLUCOSE, BUN, CREATININE, CALCIUM in the last 72 hours. PT/INR No results for input(s): LABPROT, INR in the last 72 hours. CMP     Component Value Date/Time   NA 136 10/31/2020 0304   K 3.8 10/31/2020 0304   CL 99 10/31/2020 0304   CO2 27 10/31/2020 0304   GLUCOSE 120 (H) 10/31/2020 0304   BUN 10 10/31/2020 0304   CREATININE 0.91 10/31/2020 0304   CALCIUM 8.4 (L) 10/31/2020 0304   PROT 5.4 (L) 10/29/2020 0254   ALBUMIN 2.8 (L) 10/29/2020 0254   AST 59 (H) 10/29/2020 0254   ALT 25 10/29/2020 0254   ALKPHOS 52 10/29/2020 0254   BILITOT 0.6 10/29/2020 0254   GFRNONAA >60 10/31/2020 0304   Lipase  No results found for: LIPASE     Studies/Results: No results found.  Anti-infectives: Anti-infectives (From admission, onward)   Start     Dose/Rate Route Frequency Ordered Stop   10/29/20  0400  ceFAZolin (ANCEF) IVPB 2g/100 mL premix        2 g 200 mL/hr over 30 Minutes Intravenous Every 8 hours 10/28/20 2353 10/29/20 2148       Assessment/Plan [facility] L humerus FX, L forearm FX - s/p ORIF left ulna and radius, ORIF left humerus 11/23 Dr. Carola Frost, WBAT through L elbow with platform walker, ROM as tolerated except active shoulder abduction. 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 Carola Frost, WBAT LLE EtOH intoxication Tobacco abuse THC use ABL anemia - stable(11/27) Depression-seen by psych 11/28 who recommends inpatient psych admission when medically cleared/ patient agrees. ContinueCymbalta, seroquel nightly, hydroxyzine BID PRN anxiety  FEN: regular diet ID: perioperative ancef per ortho VTE: SCD's, Lovenox (ortho rec 30 days) Foley: out  Dispo:Wean dilaudid to q12 hr PRN. Continue therapies. TOC consult for assistance with inpatient psych admission. Medically stable for discharge once bed available.    LOS: 10 days    Franne Forts, [facility] 11/06/2020, 8:49 AM Please see Amion for pager number during day hours 7:00am-4:30pm
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
## 2020-11-07 DIAGNOSIS — F329 Major depressive disorder, single episode, unspecified: Secondary | ICD-10-CM | POA: Diagnosis not present

## 2020-11-07 DIAGNOSIS — S728X2A Other fracture of left femur, initial encounter for closed fracture: Secondary | ICD-10-CM | POA: Diagnosis not present

## 2020-11-07 DIAGNOSIS — D62 Acute posthemorrhagic anemia: Secondary | ICD-10-CM | POA: Diagnosis not present

## 2020-11-07 DIAGNOSIS — S42302A Unspecified fracture of shaft of humerus, left arm, initial encounter for closed fracture: Secondary | ICD-10-CM | POA: Diagnosis not present

## 2020-11-07 NOTE — Plan of Care (Signed)

## 2020-11-07 NOTE — Plan of Care (Signed)

## 2020-11-07 NOTE — Progress Notes (Signed)
[facility] Progress Note  10 Days Post-Op  Subjective: CC-  Sleeping in chair this morning, states that it is more comfortable for his arm. LUE still causes him a lot of pain. Pain medication does help some. He has not needed dilaudid in >24 hours. Tolerating diet. Last BM a couple days ago, feels like he may have another today.  Objective: Vital signs in last 24 hours: Temp:  [98 F (36.7 C)-98.6 F (37 C)] 98.1 F (36.7 C) (12/03 0758) Pulse Rate:  [81-107] 82 (12/03 0758) Resp:  [16-20] 18 (12/03 0758) BP: (121-141)/(64-71) 121/69 (12/03 0758) SpO2:  [94 %-99 %] 94 % (12/03 0758) Last BM Date: 11/03/20  Intake/Output from previous day: 12/02 0701 - 12/03 0700 In: 240 [P.O.:240] Out: 800 [Urine:800] Intake/Output this shift: Total I/O In: 240 [P.O.:240] Out: 200 [Urine:200]  PE: Gen: Alert, NAD, pleasant Card: RRR, no M/G/R heard, 2+ DP pulses Pulm: CTAB, no W/R/R, rate and effort normal Abd: Soft, NT/ND, +BS, no HSM Ext:cdi dressingto left upper arm. Splint to left wrist. LLE sutures cdi, compartments soft Psych: A&Ox4  Skin: no rashes noted, warm and dry   Lab Results:  No results for input(s): WBC, HGB, HCT, PLT in the last 72 hours. BMET No results for input(s): NA, K, CL, CO2, GLUCOSE, BUN, CREATININE, CALCIUM in the last 72 hours. PT/INR No results for input(s): LABPROT, INR in the last 72 hours. CMP     Component Value Date/Time   NA 136 10/31/2020 0304   K 3.8 10/31/2020 0304   CL 99 10/31/2020 0304   CO2 27 10/31/2020 0304   GLUCOSE 120 (H) 10/31/2020 0304   BUN 10 10/31/2020 0304   CREATININE 0.91 10/31/2020 0304   CALCIUM 8.4 (L) 10/31/2020 0304   PROT 5.4 (L) 10/29/2020 0254   ALBUMIN 2.8 (L) 10/29/2020 0254   AST 59 (H) 10/29/2020 0254   ALT 25 10/29/2020 0254   ALKPHOS 52 10/29/2020 0254   BILITOT 0.6 10/29/2020 0254   GFRNONAA >60 10/31/2020 0304   Lipase  No results found for: LIPASE     Studies/Results: No  results found.  Anti-infectives: Anti-infectives (From admission, onward)   Start     Dose/Rate Route Frequency Ordered Stop   10/29/20 0400  ceFAZolin (ANCEF) IVPB 2g/100 mL premix        2 g 200 mL/hr over 30 Minutes Intravenous Every 8 hours 10/28/20 2353 10/29/20 2148       Assessment/Plan [facility] L humerus FX, L forearm FX - s/p ORIF left ulna and radius, ORIF left humerus 11/23 Dr. 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 Carola Frost, WBAT LLE EtOH intoxication Tobacco abuse THC use ABL anemia - stable(11/27) Depression-seen by psych 11/28 who recommends inpatient psych admission when medically cleared/ patient agrees. ContinueCymbalta, seroquel nightly, hydroxyzine BID PRN anxiety  FEN: regular diet ID: perioperative ancef per ortho VTE: SCD's, Lovenox (ortho rec 30 days) Foley: out  Dispo:Continue therapies. TOC following for assistance with inpatient psych admission (likely will need [facility]).Medically stable for discharge once bed available.  Plan to reconsult psych on Monday 12/6 for repeat evaluation.   LOS: 11 days    Franne Forts, [facility] 11/07/2020, 8:32 AM Please see Amion for pager number during day hours 7:00am-4:30pm
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
## 2020-11-08 DIAGNOSIS — S42302A Unspecified fracture of shaft of humerus, left arm, initial encounter for closed fracture: Secondary | ICD-10-CM | POA: Diagnosis not present

## 2020-11-08 DIAGNOSIS — D62 Acute posthemorrhagic anemia: Secondary | ICD-10-CM | POA: Diagnosis not present

## 2020-11-08 DIAGNOSIS — S728X2A Other fracture of left femur, initial encounter for closed fracture: Secondary | ICD-10-CM | POA: Diagnosis not present

## 2020-11-08 NOTE — Progress Notes (Signed)
11 Days Post-Op   Subjective/Chief Complaint: Complains of pain and numbness left arm   Objective: Vital signs in last 24 hours: Temp:  [97.9 F (36.6 C)-98.3 F (36.8 C)] 98.3 F (36.8 C) (12/04 0752) Pulse Rate:  [90-102] 90 (12/04 0752) Resp:  [17-20] 18 (12/04 0752) BP: (111-136)/(69-73) 120/69 (12/04 0752) SpO2:  [96 %-99 %] 98 % (12/04 0752) Last BM Date: 11/04/20 (per pt)  Intake/Output from previous day: 12/03 0701 - 12/04 0700 In: 1620 [P.O.:1620] Out: 1775 [Urine:1775] Intake/Output this shift: No intake/output data recorded.  General appearance: alert and cooperative Resp: clear to auscultation bilaterally Cardio: regular rate and rhythm GI: soft, non-tender; bowel sounds normal; no masses,  no organomegaly  Lab Results:  No results for input(s): WBC, HGB, HCT, PLT in the last 72 hours. BMET No results for input(s): NA, K, CL, CO2, GLUCOSE, BUN, CREATININE, CALCIUM in the last 72 hours. PT/INR No results for input(s): LABPROT, INR in the last 72 hours. ABG No results for input(s): PHART, HCO3 in the last 72 hours.  Invalid input(s): PCO2, PO2  Studies/Results: No results found.  Anti-infectives: Anti-infectives (From admission, onward)   Start     Dose/Rate Route Frequency Ordered Stop   10/29/20 0400  ceFAZolin (ANCEF) IVPB 2g/100 mL premix        2 g 200 mL/hr over 30 Minutes Intravenous Every 8 hours 10/28/20 2353 10/29/20 2148      Assessment/Plan: s/p Procedure(s): INTRAMEDULLARY (IM) RETROGRADE FEMORAL NAILING (Left) OPEN REDUCTION INTERNAL FIXATION (ORIF) HUMERAL SHAFT FRACTURE (Left) OPEN REDUCTION INTERNAL FIXATION (ORIF) RADIAL FRACTURE (Left) Advance diet  [facility] L humerus FX, L forearm FX - s/p ORIF left ulna and radius, ORIF left humerus 11/23 Dr. Carola Frost, WBAT through L elbow with platform walker, ROM as tolerated except active shoulder abduction.Prevenad/c-ed11/30, plan to d/c volar wrist splint in 1 week L radial nerve palsy - per  ortho, visualized intra-op, intact, aggressive PROM L femur FX - s/p retrograde IMN 11/23 Dr. Carola Frost, WBAT LLE EtOH intoxication Tobacco abuse THC use ABL anemia - stable(11/27) Depression-seen by psych 11/28 who recommends inpatient psych admission when medically cleared/ patient agrees. ContinueCymbalta, seroquel nightly, hydroxyzine BID PRN anxiety  FEN: regular diet ID: perioperative ancef per ortho VTE: SCD's, Lovenox (ortho rec 30 days) Foley: out  Dispo:Continue therapies. TOC following for assistance with inpatient psych admission (likely will need [facility]).Medically stable for discharge once bed available. Plan to reconsult psych on Monday 12/6 for repeat evaluation.  LOS: 12 days    Jason Alexander 11/08/2020

## 2020-11-08 NOTE — Plan of Care (Signed)

## 2020-11-08 NOTE — Plan of Care (Signed)
  Problem: Health Behavior/Discharge Planning: Goal: Ability to manage health-related needs will improve Outcome: Progressing   Problem: Activity: Goal: Risk for activity intolerance will decrease Outcome: Progressing   Problem: Pain Managment: Goal: General experience of comfort will improve Outcome: Progressing   

## 2020-11-09 DIAGNOSIS — D62 Acute posthemorrhagic anemia: Secondary | ICD-10-CM | POA: Diagnosis not present

## 2020-11-09 DIAGNOSIS — S728X2A Other fracture of left femur, initial encounter for closed fracture: Secondary | ICD-10-CM | POA: Diagnosis not present

## 2020-11-09 DIAGNOSIS — S42302A Unspecified fracture of shaft of humerus, left arm, initial encounter for closed fracture: Secondary | ICD-10-CM | POA: Diagnosis not present

## 2020-11-09 NOTE — Plan of Care (Signed)
  Problem: Health Behavior/Discharge Planning: Goal: Ability to manage health-related needs will improve Outcome: Progressing   Problem: Activity: Goal: Risk for activity intolerance will decrease Outcome: Progressing   Problem: Safety: Goal: Ability to remain free from injury will improve Outcome: Progressing   

## 2020-11-09 NOTE — Progress Notes (Signed)
12 Days Post-Op   Subjective/Chief Complaint: Complains of spasms in left arm. On flexeril   Objective: Vital signs in last 24 hours: Temp:  [98 F (36.7 C)-98.4 F (36.9 C)] 98 F (36.7 C) (12/05 0427) Pulse Rate:  [82-90] 82 (12/05 0427) Resp:  [16-18] 16 (12/05 0427) BP: (139-145)/(74-114) 139/74 (12/05 0427) SpO2:  [96 %-97 %] 97 % (12/05 0427) Last BM Date: 11/08/20  Intake/Output from previous day: 12/04 0701 - 12/05 0700 In: 720 [P.O.:720] Out: 850 [Urine:850] Intake/Output this shift: No intake/output data recorded.  General appearance: alert and cooperative Resp: clear to auscultation bilaterally Cardio: regular rate and rhythm GI: soft, nontender  Lab Results:  No results for input(s): WBC, HGB, HCT, PLT in the last 72 hours. BMET No results for input(s): NA, K, CL, CO2, GLUCOSE, BUN, CREATININE, CALCIUM in the last 72 hours. PT/INR No results for input(s): LABPROT, INR in the last 72 hours. ABG No results for input(s): PHART, HCO3 in the last 72 hours.  Invalid input(s): PCO2, PO2  Studies/Results: No results found.  Anti-infectives: Anti-infectives (From admission, onward)   Start     Dose/Rate Route Frequency Ordered Stop   10/29/20 0400  ceFAZolin (ANCEF) IVPB 2g/100 mL premix        2 g 200 mL/hr over 30 Minutes Intravenous Every 8 hours 10/28/20 2353 10/29/20 2148      Assessment/Plan: s/p Procedure(s): INTRAMEDULLARY (IM) RETROGRADE FEMORAL NAILING (Left) OPEN REDUCTION INTERNAL FIXATION (ORIF) HUMERAL SHAFT FRACTURE (Left) OPEN REDUCTION INTERNAL FIXATION (ORIF) RADIAL FRACTURE (Left) Advance diet  [facility] L humerus FX, L forearm FX - s/p ORIF left ulna and radius, ORIF left humerus 11/23 Dr. Carola Frost, WBAT through L elbow with platform walker, ROM as tolerated except active shoulder abduction.Prevenad/c-ed11/30, plan to d/c volar wrist splint in 1 week L radial nerve palsy - per ortho, visualized intra-op, intact, aggressive PROM L femur FX  - s/p retrograde IMN 11/23 Dr. Carola Frost, WBAT LLE EtOH intoxication Tobacco abuse THC use ABL anemia - stable(11/27) Depression-seen by psych 11/28 who recommends inpatient psych admission when medically cleared/ patient agrees. ContinueCymbalta, seroquel nightly, hydroxyzine BID PRN anxiety  FEN: regular diet ID: perioperative ancef per ortho VTE: SCD's, Lovenox (ortho rec 30 days) Foley: out  Dispo:Continue therapies. TOCfollowingfor assistance with inpatient psych admission(likely will need [facility]).Medically stable for discharge once bed available. Plan to reconsult psych on Monday 12/6 for repeat evaluation.  LOS: 13 days    Jason Alexander 11/09/2020

## 2020-11-09 NOTE — Plan of Care (Signed)

## 2020-11-09 NOTE — Progress Notes (Signed)
Orthopedic Tech Progress Note Patient Details:  Jason Alexander 07/10/1976 [id number]  Ortho Devices Type of Ortho Device: Sling immobilizer Ortho Device/Splint Location: Left Upper Extremity Ortho Device/Splint Interventions: Ordered, Application, Adjustment   Post Interventions Patient Tolerated: Well Instructions Provided: Adjustment of device, Care of device, Poper ambulation with device   Jason Alexander P Harle Stanford 11/09/2020, 1:49 PM

## 2020-11-09 NOTE — Progress Notes (Signed)
Pt frustrated when RN entered room with medications. Pt frustrated with situation making comments such as "I can't do anything right, I can't even kill myself right." Pt very tearful about situation. RN listened to pt and offered emotional support.

## 2020-11-10 DIAGNOSIS — F332 Major depressive disorder, recurrent severe without psychotic features: Secondary | ICD-10-CM | POA: Diagnosis not present

## 2020-11-10 DIAGNOSIS — S728X2A Other fracture of left femur, initial encounter for closed fracture: Secondary | ICD-10-CM | POA: Diagnosis not present

## 2020-11-10 DIAGNOSIS — D62 Acute posthemorrhagic anemia: Secondary | ICD-10-CM | POA: Diagnosis not present

## 2020-11-10 DIAGNOSIS — S42302A Unspecified fracture of shaft of humerus, left arm, initial encounter for closed fracture: Secondary | ICD-10-CM | POA: Diagnosis not present

## 2020-11-10 DIAGNOSIS — F329 Major depressive disorder, single episode, unspecified: Secondary | ICD-10-CM | POA: Diagnosis not present

## 2020-11-10 MED ORDER — DULOXETINE HCL 30 MG PO CPEP
30.0000 mg | ORAL_CAPSULE | Freq: Two times a day (BID) | ORAL | Status: DC
  Administered 2020-11-10 – 2020-11-15 (×10): 30 mg via ORAL
  Filled 2020-11-10 (×10): qty 1

## 2020-11-10 NOTE — Progress Notes (Signed)
1430 Pt placed on suicide precaution as ordered. Pt is calm and cooperative. Security came in to check pt's belongings. Pt's belongings kept at the cabinet outside the room. 1:1 suicide sitter provided in the room.
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
## 2020-11-11 ENCOUNTER — Inpatient Hospital Stay (HOSPITAL_COMMUNITY): Payer: Medicaid Other

## 2020-11-11 DIAGNOSIS — S728X2A Other fracture of left femur, initial encounter for closed fracture: Secondary | ICD-10-CM | POA: Diagnosis not present

## 2020-11-11 DIAGNOSIS — D62 Acute posthemorrhagic anemia: Secondary | ICD-10-CM | POA: Diagnosis not present

## 2020-11-11 DIAGNOSIS — S42202A Unspecified fracture of upper end of left humerus, initial encounter for closed fracture: Secondary | ICD-10-CM | POA: Diagnosis not present

## 2020-11-11 DIAGNOSIS — M7989 Other specified soft tissue disorders: Secondary | ICD-10-CM | POA: Diagnosis not present

## 2020-11-11 DIAGNOSIS — S42302A Unspecified fracture of shaft of humerus, left arm, initial encounter for closed fracture: Secondary | ICD-10-CM | POA: Diagnosis not present

## 2020-11-11 DIAGNOSIS — S5292XA Unspecified fracture of left forearm, initial encounter for closed fracture: Secondary | ICD-10-CM | POA: Diagnosis not present

## 2020-11-11 DIAGNOSIS — F329 Major depressive disorder, single episode, unspecified: Secondary | ICD-10-CM | POA: Diagnosis not present

## 2020-11-11 DIAGNOSIS — S7292XA Unspecified fracture of left femur, initial encounter for closed fracture: Secondary | ICD-10-CM | POA: Diagnosis not present

## 2020-11-11 DIAGNOSIS — F332 Major depressive disorder, recurrent severe without psychotic features: Secondary | ICD-10-CM | POA: Diagnosis not present

## 2020-11-11 MED ORDER — HYDROMORPHONE HCL 1 MG/ML IJ SOLN
0.2500 mg | Freq: Every day | INTRAMUSCULAR | Status: DC | PRN
  Administered 2020-11-11 – 2020-11-13 (×4): 0.25 mg via INTRAVENOUS
  Filled 2020-11-11 (×4): qty 0.5
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
## 2020-11-11 NOTE — Progress Notes (Signed)
Dr Carola Frost was sent a secure chat message in regards to the PT reporting that during therapy session- the patient had increased pain in the left knee
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
## 2020-11-11 NOTE — Plan of Care (Signed)

## 2020-11-12 DIAGNOSIS — D62 Acute posthemorrhagic anemia: Secondary | ICD-10-CM | POA: Diagnosis not present

## 2020-11-12 DIAGNOSIS — S728X2A Other fracture of left femur, initial encounter for closed fracture: Secondary | ICD-10-CM | POA: Diagnosis not present

## 2020-11-12 DIAGNOSIS — S42302A Unspecified fracture of shaft of humerus, left arm, initial encounter for closed fracture: Secondary | ICD-10-CM | POA: Diagnosis not present

## 2020-11-12 DIAGNOSIS — F329 Major depressive disorder, single episode, unspecified: Secondary | ICD-10-CM | POA: Diagnosis not present

## 2020-11-12 NOTE — Progress Notes (Signed)
Inpatient Rehabilitation Admissions Coordinator  Notified by Raynelle Fanning, with [facility], that discussions had occurred that patient may be considered for admit to [facility] prior to inpatient placement to psych. I will contact Dr. Riley Kill to discuss, begin insurance authorization with [place] medicaid [employer], discuss with patient and call his sister to confirm eventual dispo once I obtain patient permission to contact sister.   Ottie Glazier, RN, MSN Rehab Admissions Coordinator [phone] 11/12/2020 10:43 AM
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
## 2020-11-12 NOTE — Progress Notes (Addendum)
Sutures removed from left arm operative sites and the left leg. without difficulty.  Incisions are all healing well.  Applied steri strips to upper left arm and lower left arm, just for "comfort measure" for the patient.  The patient appeared anxious during suture removal.
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
## 2020-11-12 NOTE — Discharge Instructions (Signed)
Weightbearing as tolerated to left lower extremity Weightbearing as tolerated through left elbow, nonweightbearing through left wrist, ok to use platform walker Ice and elevate left upper and lower extremities for swelling and pain Minimize sling use Custom splint for right radial nerve palsy  Aggressive passive range of motion digits Left hand, wrist and elbow.  Gentle forearm supination/pronation Range of motion as tolerated as tolerated left lower extremity Range of motion as tolerated left upper extremity EXCEPT no active shoulder abduction

## 2020-11-12 NOTE — TOC Progression Note (Addendum)
Transition of Care [facility]) - Progression Note    Patient Details  Name: Jason Alexander MRN: [id number] Date of Birth: 09-13-1976  Transition of Care [facility]) CM/SW Contact  Glennon Mac, RN Phone Number: 11/12/2020,  Clinical Narrative: Transition of care team in collaboration with inpatient rehab to provide patient with physical rehab prior to inpatient psych admission.  As patient not being accepted by [facility] or other psych facilities at this time due to physical limitations, he would ultimately be a [facility] referral and would end up on a wait list.  It would be beneficial to the patient to receive physical rehab and then go to inpatient psych.  Rehab consult has been reordered today and patient has been visited by admissions coordinator.  Patient plans to discharge home with sister.  Await insurance authorization and bed availability for inpatient rehab.  Will follow.    Expected Discharge Plan: Psychiatric Hospital Barriers to Discharge: Continued Medical Work up  Expected Discharge Plan and Services Expected Discharge Plan: Psychiatric Hospital   Discharge Planning Services: CM Consult   Living arrangements for the past 2 months: Homeless                                       Social Determinants of Health (SDOH) Interventions    Readmission Risk Interventions No flowsheet data found.  Quintella Baton, RN, BSN  Trauma/Neuro ICU Case Manager [phone]

## 2020-11-12 NOTE — Progress Notes (Signed)
Orthopedic Tech Progress Note Patient Details:  Jason Alexander 12/05/1976 [id number] Applied after RN removed stitches  Ortho Devices Type of Ortho Device: Velcro wrist forearm splint Ortho Device/Splint Location: LUE Ortho Device/Splint Interventions: Application, Adjustment   Post Interventions Patient Tolerated: Well Instructions Provided: Care of device   Donald Pore 11/12/2020, 11:32 AM

## 2020-11-12 NOTE — Plan of Care (Signed)

## 2020-11-12 NOTE — Progress Notes (Signed)
Inpatient Rehabilitation Admissions Coordinator  I met with sister at bedside with patient. They both agree that he needs inpatient psych and he states he volunteers for this. They also are hopeful that he receives physical rehab prior to d/c home with her. I discused goals and expectations of rehab and that I await insurance determination as well as bed availability.  Danne Baxter, RN, MSN Rehab Admissions Coordinator [phone] 11/12/2020 1:44 PM
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
## 2020-11-13 DIAGNOSIS — S728X2A Other fracture of left femur, initial encounter for closed fracture: Secondary | ICD-10-CM | POA: Diagnosis not present

## 2020-11-13 DIAGNOSIS — F329 Major depressive disorder, single episode, unspecified: Secondary | ICD-10-CM | POA: Diagnosis not present

## 2020-11-13 DIAGNOSIS — S42302A Unspecified fracture of shaft of humerus, left arm, initial encounter for closed fracture: Secondary | ICD-10-CM | POA: Diagnosis not present

## 2020-11-13 DIAGNOSIS — D62 Acute posthemorrhagic anemia: Secondary | ICD-10-CM | POA: Diagnosis not present

## 2020-11-13 NOTE — Progress Notes (Signed)
[facility] Progress Note  16 Days Post-Op  Subjective: CC-  Feels about the same as yesterday. Continues to have pain in the LUE and LLE. Left knee is still swollen.  Objective: Vital signs in last 24 hours: Temp:  [97.9 F (36.6 C)-98.2 F (36.8 C)] 98.1 F (36.7 C) (12/09 0406) Pulse Rate:  [81-93] 84 (12/09 0406) Resp:  [18-20] 18 (12/09 0406) BP: (108-122)/(65-82) 110/66 (12/09 0406) SpO2:  [97 %-100 %] 97 % (12/09 0406) Last BM Date: 11/12/20  Intake/Output from previous day: 12/08 0701 - 12/09 0700 In: 840 [P.O.:840] Out: 600 [Urine:600] Intake/Output this shift: No intake/output data recorded.  PE: Gen: Alert, NAD, pleasant Card: RRR, no M/G/R heard, 2+ DP pulses Pulm: CTAB, no W/R/R, rate and effort normal Abd: Soft, NT/ND, +BS, no HSM Ext: left upper arm incision cdi with steri strips in place. Velcro splint to left wrist. LLE incisions cdi, compartments soft. Left knee with decreased ROM due to pain/large joint effusion, no joint line TTP, TTP medial femoral condyle and posterior knee, no gross laxity on ligamentous exam although difficult to assess due to effusion Psych: A&Ox4  Skin: no rashes noted, warm and dry   Lab Results:  No results for input(s): WBC, HGB, HCT, PLT in the last 72 hours. BMET No results for input(s): NA, K, CL, CO2, GLUCOSE, BUN, CREATININE, CALCIUM in the last 72 hours. PT/INR No results for input(s): LABPROT, INR in the last 72 hours. CMP     Component Value Date/Time   NA 136 10/31/2020 0304   K 3.8 10/31/2020 0304   CL 99 10/31/2020 0304   CO2 27 10/31/2020 0304   GLUCOSE 120 (H) 10/31/2020 0304   BUN 10 10/31/2020 0304   CREATININE 0.91 10/31/2020 0304   CALCIUM 8.4 (L) 10/31/2020 0304   PROT 5.4 (L) 10/29/2020 0254   ALBUMIN 2.8 (L) 10/29/2020 0254   AST 59 (H) 10/29/2020 0254   ALT 25 10/29/2020 0254   ALKPHOS 52 10/29/2020 0254   BILITOT 0.6 10/29/2020 0254   GFRNONAA >60 10/31/2020 0304   Lipase   No results found for: LIPASE     Studies/Results: DG Forearm Left  Result Date: 11/11/2020 CLINICAL DATA:  Pain EXAM: LEFT FOREARM - 2 VIEW COMPARISON:  October 29, 2020 FINDINGS: The patient is status post prior plate and screw fixation of the forearm for the previously demonstrated fractures of the radius and ulna. The hardware appears grossly intact. There is minimal interval healing. There is persistent surrounding soft tissue swelling. A plaster splint is noted. IMPRESSION: Status post prior plate and screw fixation of the forearm for the previously demonstrated fractures of the radius and ulna. Electronically Signed   By: Katherine Mantle M.D.   On: 11/11/2020 20:24   DG Humerus Left  Result Date: 11/11/2020 CLINICAL DATA:  Status post ORIF EXAM: LEFT HUMERUS - 2+ VIEW COMPARISON:  October 29, 2020 FINDINGS: The patient is status post prior plate screw fixation of the left humerus. The hardware is intact. The osseous alignment is unchanged. There is no evidence for significant interval healing. There is persistent soft tissue swelling about the left upper extremity. IMPRESSION: Status post prior plate screw fixation of the left humerus without evidence for significant interval healing. Electronically Signed   By: Katherine Mantle M.D.   On: 11/11/2020 20:25   DG FEMUR MIN 2 VIEWS LEFT  Result Date: 11/11/2020 CLINICAL DATA:  Status post intramedullary nail placement EXAM: LEFT FEMUR 2 VIEWS COMPARISON:  October 29, 2020 FINDINGS: Again noted is an intramedullary nail through the left femur for the previously demonstrated left femur fracture. The alignment is unchanged. The hardware is intact. There is minimal interval healing of the femoral fracture. There is a large suprapatellar joint effusion. There is soft tissue swelling about the lower extremity. IMPRESSION: Again noted is an intramedullary nail through the left femur for the previously demonstrated left femur fracture.  Electronically Signed   By: Katherine Mantle M.D.   On: 11/11/2020 20:26    Anti-infectives: Anti-infectives (From admission, onward)   Start     Dose/Rate Route Frequency Ordered Stop   10/29/20 0400  ceFAZolin (ANCEF) IVPB 2g/100 mL premix        2 g 200 mL/hr over 30 Minutes Intravenous Every 8 hours 10/28/20 2353 10/29/20 2148       Assessment/Plan [facility] L humerus FX, L forearm FX - s/p ORIF left ulna and radius, ORIF left humerus 11/23 Dr. 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 Slowly improving L femur FX - s/p retrograde IMN 11/23 Dr. Carola Frost, WBAT LLE L knee pain/effusion - xray 12/7 showed stable hardware and joint effusion. eval by ortho, suspect 2/2 above injury and retrograde nail. Ice, elevate, gentle ROM for swelling EtOH intoxication Tobacco abuse THC use ABL anemia - stable(11/27) Depression-seen by psychagain 12/6who recommends inpatient psych admission when medically cleared/ patient agrees. Cymbaltaincreased 12/6, seroquel nightly, hydroxyzine BID PRN anxiety.  FEN: regular diet ID: perioperative ancef per ortho VTE: SCD's, Lovenox (ortho rec 30 days) Foley: out  Dispo:Continue therapies. Psych recommends inpatient psych at discharge. Sister has confirmed disposition. CIR following, may be able to go to [facility] from inpatient rehab once more independent.  Medically stable for discharge once bed available.    LOS: 17 days    Franne Forts, [facility] Surgery 11/13/2020, 7:50 AM Please see Amion for pager number during day hours 7:00am-4:30pm
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
## 2020-11-13 NOTE — Plan of Care (Signed)

## 2020-11-13 NOTE — Plan of Care (Signed)
°  Problem: Education: ?Goal: Knowledge of General Education information will improve ?Description: Including pain rating scale, medication(s)/side effects and non-pharmacologic comfort measures ?Outcome: Progressing ?  ?Problem: Health Behavior/Discharge Planning: ?Goal: Ability to manage health-related needs will improve ?Outcome: Progressing ?  ?Problem: Clinical Measurements: ?Goal: Ability to maintain clinical measurements within normal limits will improve ?Outcome: Progressing ?  ?Problem: Activity: ?Goal: Risk for activity intolerance will decrease ?Outcome: Progressing ?  ?Problem: Nutrition: ?Goal: Adequate nutrition will be maintained ?Outcome: Progressing ?  ?Problem: Coping: ?Goal: Level of anxiety will decrease ?Outcome: Progressing ?  ?Problem: Elimination: ?Goal: Will not experience complications related to bowel motility ?Outcome: Progressing ?  ?Problem: Pain Managment: ?Goal: General experience of comfort will improve ?Outcome: Progressing ?  ?Problem: Safety: ?Goal: Ability to remain free from injury will improve ?Outcome: Progressing ?  ?Problem: Skin Integrity: ?Goal: Risk for impaired skin integrity will decrease ?Outcome: Progressing ?  ?
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
## 2020-11-14 DIAGNOSIS — F329 Major depressive disorder, single episode, unspecified: Secondary | ICD-10-CM | POA: Diagnosis not present

## 2020-11-14 DIAGNOSIS — S42302A Unspecified fracture of shaft of humerus, left arm, initial encounter for closed fracture: Secondary | ICD-10-CM | POA: Diagnosis not present

## 2020-11-14 DIAGNOSIS — D62 Acute posthemorrhagic anemia: Secondary | ICD-10-CM | POA: Diagnosis not present

## 2020-11-14 DIAGNOSIS — S728X2A Other fracture of left femur, initial encounter for closed fracture: Secondary | ICD-10-CM | POA: Diagnosis not present

## 2020-11-14 NOTE — Plan of Care (Signed)
  Problem: Health Behavior/Discharge Planning: Goal: Ability to manage health-related needs will improve Outcome: Progressing   Problem: Activity: Goal: Risk for activity intolerance will decrease Outcome: Progressing   Problem: Pain Managment: Goal: General experience of comfort will improve Outcome: Progressing   
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
## 2020-11-14 NOTE — Progress Notes (Signed)
[facility] Progress Note  17 Days Post-Op  Subjective: CC-  Slept well last night. Continues to complain of LUE and LLE pain. Left knee swelling somewhat improved.  Tolerating diet. BM yesterday. Progressing well with therapies.  Objective: Vital signs in last 24 hours: Temp:  [97.4 F (36.3 C)-97.9 F (36.6 C)] 97.4 F (36.3 C) (12/10 0440) Pulse Rate:  [77-92] 77 (12/10 0440) Resp:  [14-16] 16 (12/10 0440) BP: (110-121)/(73-84) 110/73 (12/10 0440) SpO2:  [92 %-99 %] 99 % (12/10 0440) Last BM Date: 11/12/20  Intake/Output from previous day: 12/09 0701 - 12/10 0700 In: -  Out: 750 [Urine:750] Intake/Output this shift: No intake/output data recorded.  PE:  Gen: Alert, NAD, pleasant Card: RRR, no M/G/R heard, 2+ DP pulses Pulm: CTAB, no W/R/R, rate and effort normal Abd: Soft, NT/ND, +BS, no HSM Ext: left upper arm incision cdi with steri strips in place. Velcro splint to left wrist. LLE incisions cdi, compartments soft. Left knee joint effusion mildly improved, no joint line TTP, TTP medial femoral condyle and posterior knee Psych: A&Ox4  Skin: no rashes noted, warm and dry   Lab Results:  No results for input(s): WBC, HGB, HCT, PLT in the last 72 hours. BMET No results for input(s): NA, K, CL, CO2, GLUCOSE, BUN, CREATININE, CALCIUM in the last 72 hours. PT/INR No results for input(s): LABPROT, INR in the last 72 hours. CMP     Component Value Date/Time   NA 136 10/31/2020 0304   K 3.8 10/31/2020 0304   CL 99 10/31/2020 0304   CO2 27 10/31/2020 0304   GLUCOSE 120 (H) 10/31/2020 0304   BUN 10 10/31/2020 0304   CREATININE 0.91 10/31/2020 0304   CALCIUM 8.4 (L) 10/31/2020 0304   PROT 5.4 (L) 10/29/2020 0254   ALBUMIN 2.8 (L) 10/29/2020 0254   AST 59 (H) 10/29/2020 0254   ALT 25 10/29/2020 0254   ALKPHOS 52 10/29/2020 0254   BILITOT 0.6 10/29/2020 0254   GFRNONAA >60 10/31/2020 0304   Lipase  No results found for:  LIPASE     Studies/Results: No results found.  Anti-infectives: Anti-infectives (From admission, onward)   Start     Dose/Rate Route Frequency Ordered Stop   10/29/20 0400  ceFAZolin (ANCEF) IVPB 2g/100 mL premix        2 g 200 mL/hr over 30 Minutes Intravenous Every 8 hours 10/28/20 2353 10/29/20 2148       Assessment/Plan [facility] L humerus FX, L forearm FX - s/p ORIF left ulna and radius, ORIF left humerus 11/23 Dr. Carola Frost, WBAT through L elbow with platform walker, ROM as tolerated except active shoulder abduction.Prevenad/c-ed11/30, sutures removed 12/8, OT custom splint for radial nerve palsy intermittently during the day and velcro splint at night L radial nerve palsy - per ortho, visualized intra-op, intact, aggressive PROM. Slowly improving L femur FX - s/p retrograde IMN 11/23 Dr. Carola Frost, WBAT LLE L knee pain/effusion - xray 12/7 showed stable hardware and joint effusion. eval by ortho, suspect 2/2 above injury and retrograde nail. Ice, elevate, gentle ROM for swelling, try thigh high ted hose EtOH intoxication Tobacco abuse THC use ABL anemia - stable(11/27) Depression-seen by psychagain 12/6who recommends inpatient psych admission when medically cleared/ patient agrees. Cymbaltaincreased 12/6, seroquel nightly, hydroxyzine BID PRN anxiety. Plan weekly consults  FEN: regular diet ID: perioperative ancef per ortho VTE: SCD's, Lovenox (ortho rec 30 days) Foley: out  Dispo:Continue therapies. Psych recommends inpatient psych at discharge. Sister has confirmed disposition. CIR following at  this time. May go to CIR until more independent then to [facility]. If patient progresses with acute care therapies he may get to the point that he does not need CIR, then can go straight to [facility]. Medically stable for discharge once bed available.   LOS: 18 days    Franne Forts, [facility] 11/14/2020, 8:40 AM Please see Amion for pager number during day hours  7:00am-4:30pm

## 2020-11-14 NOTE — Progress Notes (Addendum)
Inpatient Rehabilitation Admissions Coordinator  Insurance has approved and CIR has a bed available to admit him to on Saturday. I met with patient at bedside and left a voicemail for his sister to call me . Acute team, Trauma PA and TOC made aware. Saturday Dr. Ranell Patrick, Rehab MD ,will see patient to give final approval . At 12 noon, 5 N RN can call rehab charge nurse to give report at [phone]. I will make the arrangements to admit Saturday when bed is available.  Danne Baxter, RN, MSN Rehab Admissions Coordinator [phone] 11/14/2020 1:13 PM    Sister returned call and aware of admit to [facility] planned for Saturday.  Danne Baxter, RN, MSN Rehab Admissions Coordinator [phone] 11/14/2020 2:47 PM
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
## 2020-11-15 ENCOUNTER — Encounter (HOSPITAL_COMMUNITY): Payer: Self-pay | Admitting: Physical Medicine & Rehabilitation

## 2020-11-15 ENCOUNTER — Encounter: Payer: Self-pay | Admitting: Family Medicine

## 2020-11-15 ENCOUNTER — Other Ambulatory Visit: Payer: Self-pay

## 2020-11-15 ENCOUNTER — Inpatient Hospital Stay (HOSPITAL_COMMUNITY)
Admission: RE | Admit: 2020-11-15 | Discharge: 2020-11-22 | DRG: 560 | Disposition: A | Discharge status: Home Health | Payer: Medicaid Other | Source: Intra-hospital | Attending: Physical Medicine & Rehabilitation | Admitting: Physical Medicine & Rehabilitation

## 2020-11-15 DIAGNOSIS — S72352A Displaced comminuted fracture of shaft of left femur, initial encounter for closed fracture: Secondary | ICD-10-CM

## 2020-11-15 DIAGNOSIS — Z8249 Family history of ischemic heart disease and other diseases of the circulatory system: Secondary | ICD-10-CM

## 2020-11-15 DIAGNOSIS — Z888 Allergy status to other drugs, medicaments and biological substances status: Secondary | ICD-10-CM | POA: Diagnosis not present

## 2020-11-15 DIAGNOSIS — S52352A Displaced comminuted fracture of shaft of radius, left arm, initial encounter for closed fracture: Secondary | ICD-10-CM | POA: Diagnosis present

## 2020-11-15 DIAGNOSIS — Z811 Family history of alcohol abuse and dependence: Secondary | ICD-10-CM | POA: Diagnosis not present

## 2020-11-15 DIAGNOSIS — Z832 Family history of diseases of the blood and blood-forming organs and certain disorders involving the immune mechanism: Secondary | ICD-10-CM

## 2020-11-15 DIAGNOSIS — S52302D Unspecified fracture of shaft of left radius, subsequent encounter for closed fracture with routine healing: Secondary | ICD-10-CM | POA: Diagnosis not present

## 2020-11-15 DIAGNOSIS — E559 Vitamin D deficiency, unspecified: Secondary | ICD-10-CM | POA: Diagnosis present

## 2020-11-15 DIAGNOSIS — D62 Acute posthemorrhagic anemia: Secondary | ICD-10-CM | POA: Diagnosis not present

## 2020-11-15 DIAGNOSIS — F17210 Nicotine dependence, cigarettes, uncomplicated: Secondary | ICD-10-CM | POA: Diagnosis present

## 2020-11-15 DIAGNOSIS — M25462 Effusion, left knee: Secondary | ICD-10-CM | POA: Diagnosis present

## 2020-11-15 DIAGNOSIS — Z825 Family history of asthma and other chronic lower respiratory diseases: Secondary | ICD-10-CM

## 2020-11-15 DIAGNOSIS — N528 Other male erectile dysfunction: Secondary | ICD-10-CM | POA: Diagnosis present

## 2020-11-15 DIAGNOSIS — S52202D Unspecified fracture of shaft of left ulna, subsequent encounter for closed fracture with routine healing: Secondary | ICD-10-CM

## 2020-11-15 DIAGNOSIS — Z813 Family history of other psychoactive substance abuse and dependence: Secondary | ICD-10-CM

## 2020-11-15 DIAGNOSIS — S42302D Unspecified fracture of shaft of humerus, left arm, subsequent encounter for fracture with routine healing: Secondary | ICD-10-CM | POA: Diagnosis not present

## 2020-11-15 DIAGNOSIS — F411 Generalized anxiety disorder: Secondary | ICD-10-CM | POA: Diagnosis present

## 2020-11-15 DIAGNOSIS — G2401 Drug induced subacute dyskinesia: Secondary | ICD-10-CM | POA: Diagnosis present

## 2020-11-15 DIAGNOSIS — Z79899 Other long term (current) drug therapy: Secondary | ICD-10-CM | POA: Diagnosis not present

## 2020-11-15 DIAGNOSIS — Z800 Family history of malignant neoplasm of digestive organs: Secondary | ICD-10-CM | POA: Diagnosis not present

## 2020-11-15 DIAGNOSIS — F32A Depression, unspecified: Secondary | ICD-10-CM | POA: Diagnosis present

## 2020-11-15 DIAGNOSIS — F322 Major depressive disorder, single episode, severe without psychotic features: Secondary | ICD-10-CM | POA: Diagnosis not present

## 2020-11-15 DIAGNOSIS — S42352A Displaced comminuted fracture of shaft of humerus, left arm, initial encounter for closed fracture: Secondary | ICD-10-CM | POA: Diagnosis present

## 2020-11-15 DIAGNOSIS — T07XXXA Unspecified multiple injuries, initial encounter: Secondary | ICD-10-CM | POA: Diagnosis present

## 2020-11-15 DIAGNOSIS — G5632 Lesion of radial nerve, left upper limb: Secondary | ICD-10-CM | POA: Diagnosis present

## 2020-11-15 DIAGNOSIS — S42352S Displaced comminuted fracture of shaft of humerus, left arm, sequela: Secondary | ICD-10-CM | POA: Diagnosis not present

## 2020-11-15 DIAGNOSIS — S52352S Displaced comminuted fracture of shaft of radius, left arm, sequela: Secondary | ICD-10-CM | POA: Diagnosis not present

## 2020-11-15 DIAGNOSIS — F17200 Nicotine dependence, unspecified, uncomplicated: Secondary | ICD-10-CM | POA: Diagnosis present

## 2020-11-15 DIAGNOSIS — Z818 Family history of other mental and behavioral disorders: Secondary | ICD-10-CM | POA: Diagnosis not present

## 2020-11-15 DIAGNOSIS — S52252A Displaced comminuted fracture of shaft of ulna, left arm, initial encounter for closed fracture: Secondary | ICD-10-CM | POA: Diagnosis present

## 2020-11-15 DIAGNOSIS — S72352D Displaced comminuted fracture of shaft of left femur, subsequent encounter for closed fracture with routine healing: Secondary | ICD-10-CM | POA: Diagnosis not present

## 2020-11-15 DIAGNOSIS — E291 Testicular hypofunction: Secondary | ICD-10-CM | POA: Diagnosis present

## 2020-11-15 DIAGNOSIS — R635 Abnormal weight gain: Secondary | ICD-10-CM | POA: Diagnosis present

## 2020-11-15 MED ORDER — LIDOCAINE 5 % EX PTCH
1.0000 | MEDICATED_PATCH | Freq: Every day | CUTANEOUS | Status: DC
  Administered 2020-11-16 – 2020-11-22 (×7): 1 via TRANSDERMAL
  Filled 2020-11-15 (×7): qty 1

## 2020-11-15 MED ORDER — DOCUSATE SODIUM 100 MG PO CAPS
100.0000 mg | ORAL_CAPSULE | Freq: Two times a day (BID) | ORAL | Status: DC
  Administered 2020-11-15 – 2020-11-21 (×13): 100 mg via ORAL
  Filled 2020-11-15 (×14): qty 1

## 2020-11-15 MED ORDER — CYCLOBENZAPRINE HCL 5 MG PO TABS
5.0000 mg | ORAL_TABLET | Freq: Three times a day (TID) | ORAL | Status: DC
  Administered 2020-11-15 – 2020-11-22 (×20): 5 mg via ORAL
  Filled 2020-11-15 (×20): qty 1

## 2020-11-15 MED ORDER — OXYCODONE HCL 5 MG PO TABS
5.0000 mg | ORAL_TABLET | ORAL | Status: DC | PRN
  Administered 2020-11-15 – 2020-11-22 (×26): 10 mg via ORAL
  Filled 2020-11-15 (×26): qty 2

## 2020-11-15 MED ORDER — ENOXAPARIN SODIUM 30 MG/0.3ML ~~LOC~~ SOLN: 30.0000 mg | Freq: Two times a day (BID) | SUBCUTANEOUS | Status: DC

## 2020-11-15 MED ORDER — ACETAMINOPHEN 325 MG PO TABS
325.0000 mg | ORAL_TABLET | ORAL | Status: DC | PRN
  Administered 2020-11-16 – 2020-11-21 (×4): 650 mg via ORAL
  Filled 2020-11-15 (×5): qty 2

## 2020-11-15 MED ORDER — POLYETHYLENE GLYCOL 3350 17 G PO PACK
17.0000 g | PACK | Freq: Every day | ORAL | Status: DC
  Administered 2020-11-16 – 2020-11-21 (×5): 17 g via ORAL
  Filled 2020-11-15 (×7): qty 1

## 2020-11-15 MED ORDER — TRAMADOL HCL 50 MG PO TABS
50.0000 mg | ORAL_TABLET | Freq: Four times a day (QID) | ORAL | Status: DC
  Administered 2020-11-15 – 2020-11-22 (×27): 50 mg via ORAL
  Filled 2020-11-15 (×27): qty 1

## 2020-11-15 MED ORDER — HYDROXYZINE HCL 25 MG PO TABS
25.0000 mg | ORAL_TABLET | Freq: Two times a day (BID) | ORAL | Status: DC | PRN
  Administered 2020-11-15 – 2020-11-22 (×11): 25 mg via ORAL
  Filled 2020-11-15 (×11): qty 1

## 2020-11-15 MED ORDER — ONDANSETRON HCL 4 MG/2ML IJ SOLN: 4.0000 mg | Freq: Four times a day (QID) | INTRAMUSCULAR | Status: DC | PRN

## 2020-11-15 MED ORDER — PREGABALIN 50 MG PO CAPS
100.0000 mg | ORAL_CAPSULE | Freq: Three times a day (TID) | ORAL | Status: DC
  Administered 2020-11-15 – 2020-11-20 (×14): 100 mg via ORAL
  Filled 2020-11-15 (×14): qty 2

## 2020-11-15 MED ORDER — ASCORBIC ACID 500 MG PO TABS
1000.0000 mg | ORAL_TABLET | Freq: Every day | ORAL | Status: DC
  Administered 2020-11-16 – 2020-11-22 (×7): 1000 mg via ORAL
  Filled 2020-11-15 (×7): qty 2

## 2020-11-15 MED ORDER — QUETIAPINE FUMARATE 50 MG PO TABS
50.0000 mg | ORAL_TABLET | Freq: Every day | ORAL | Status: DC
  Administered 2020-11-15 – 2020-11-21 (×7): 50 mg via ORAL
  Filled 2020-11-15 (×7): qty 1

## 2020-11-15 MED ORDER — ONDANSETRON 4 MG PO TBDP: 4.0000 mg | ORAL_TABLET | Freq: Four times a day (QID) | ORAL | Status: DC | PRN

## 2020-11-15 MED ORDER — VITAMIN D 25 MCG (1000 UNIT) PO TABS
2000.0000 [IU] | ORAL_TABLET | Freq: Two times a day (BID) | ORAL | Status: DC
  Administered 2020-11-15 – 2020-11-22 (×14): 2000 [IU] via ORAL
  Filled 2020-11-15 (×14): qty 2

## 2020-11-15 MED ORDER — ADULT MULTIVITAMIN W/MINERALS CH
1.0000 | ORAL_TABLET | Freq: Every day | ORAL | Status: DC
  Administered 2020-11-16 – 2020-11-22 (×7): 1 via ORAL
  Filled 2020-11-15 (×7): qty 1

## 2020-11-15 MED ORDER — ENOXAPARIN SODIUM 30 MG/0.3ML ~~LOC~~ SOLN
30.0000 mg | Freq: Two times a day (BID) | SUBCUTANEOUS | Status: DC
  Administered 2020-11-15 – 2020-11-22 (×14): 30 mg via SUBCUTANEOUS
  Filled 2020-11-15 (×14): qty 0.3

## 2020-11-15 MED ORDER — DULOXETINE HCL 30 MG PO CPEP
30.0000 mg | ORAL_CAPSULE | Freq: Two times a day (BID) | ORAL | Status: DC
  Administered 2020-11-15 – 2020-11-22 (×14): 30 mg via ORAL
  Filled 2020-11-15 (×14): qty 1
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
## 2020-11-15 NOTE — Progress Notes (Signed)
Inpatient Rehabilitation Medication Review by a Pharmacist  A complete drug regimen review was completed for this patient to identify any potential clinically significant medication issues.  Clinically significant medication issues were identified:  no  Check AMION for pharmacist assigned to patient if future medication questions/issues arise during this admission.  Pharmacist comments:   Time spent performing this drug regimen review (minutes):  <4min   Blue Ruggerio S. Merilynn Finland, PharmD, BCPS Clinical Staff Pharmacist [email or web address]  Pasty Spillers 11/15/2020 5:20 PM

## 2020-11-16 ENCOUNTER — Inpatient Hospital Stay (HOSPITAL_COMMUNITY): Payer: Medicaid Other | Admitting: Speech Pathology

## 2020-11-16 ENCOUNTER — Inpatient Hospital Stay (HOSPITAL_COMMUNITY): Payer: Medicaid Other | Admitting: Physical Therapy

## 2020-11-16 ENCOUNTER — Inpatient Hospital Stay (HOSPITAL_COMMUNITY): Payer: Medicaid Other

## 2020-11-16 DIAGNOSIS — S72352A Displaced comminuted fracture of shaft of left femur, initial encounter for closed fracture: Secondary | ICD-10-CM | POA: Diagnosis not present
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
## 2020-11-16 NOTE — Plan of Care (Signed)
  Problem: Consults Goal: RH GENERAL PATIENT EDUCATION Description: See Patient Education module for education specifics. Outcome: Progressing   Problem: RH SKIN INTEGRITY Goal: RH STG SKIN FREE OF INFECTION/BREAKDOWN Description: No skin breakdown this admission Outcome: Progressing Goal: RH STG MAINTAIN SKIN INTEGRITY WITH ASSISTANCE Description: STG Maintain Skin Integrity With min Assistance. Outcome: Progressing   Problem: RH SAFETY Goal: RH STG ADHERE TO SAFETY PRECAUTIONS W/ASSISTANCE/DEVICE Description: STG Adhere to Safety Precautions With min Assistance/Device. Outcome: Progressing   Problem: RH PAIN MANAGEMENT Goal: RH STG PAIN MANAGED AT OR BELOW PT'S PAIN GOAL Description: Less than 3 out of 10 Outcome: Progressing   Problem: RH KNOWLEDGE DEFICIT GENERAL Goal: RH STG INCREASE KNOWLEDGE OF SELF CARE AFTER HOSPITALIZATION Outcome: Progressing
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
## 2020-11-16 NOTE — Progress Notes (Signed)
VASCULAR LAB    Bilateral lower extremity venous duplex has been performed.  See CV proc for preliminary results.   Lynette Noah, RVT 11/16/2020, 4:23 PM
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
## 2020-11-17 ENCOUNTER — Inpatient Hospital Stay (HOSPITAL_COMMUNITY): Payer: Self-pay | Admitting: Speech Pathology

## 2020-11-17 ENCOUNTER — Inpatient Hospital Stay (HOSPITAL_COMMUNITY): Payer: Medicaid Other | Admitting: Occupational Therapy

## 2020-11-17 ENCOUNTER — Inpatient Hospital Stay (HOSPITAL_COMMUNITY): Payer: Medicaid Other

## 2020-11-17 DIAGNOSIS — S52352S Displaced comminuted fracture of shaft of radius, left arm, sequela: Secondary | ICD-10-CM | POA: Diagnosis not present

## 2020-11-17 DIAGNOSIS — G5632 Lesion of radial nerve, left upper limb: Secondary | ICD-10-CM

## 2020-11-17 DIAGNOSIS — S42352S Displaced comminuted fracture of shaft of humerus, left arm, sequela: Secondary | ICD-10-CM

## 2020-11-17 DIAGNOSIS — S72352A Displaced comminuted fracture of shaft of left femur, initial encounter for closed fracture: Secondary | ICD-10-CM | POA: Diagnosis not present

## 2020-11-17 DIAGNOSIS — F411 Generalized anxiety disorder: Secondary | ICD-10-CM | POA: Diagnosis not present

## 2020-11-17 LAB — CBC WITH DIFFERENTIAL/PLATELET
Abs Immature Granulocytes: 0.02 10*3/uL (ref 0.00–0.07)
Basophils Absolute: 0.1 10*3/uL (ref 0.0–0.1)
Basophils Relative: 1 %
Eosinophils Absolute: 0.2 10*3/uL (ref 0.0–0.5)
Eosinophils Relative: 3 %
HCT: 31.6 % — ABNORMAL LOW (ref 39.0–52.0)
Hemoglobin: 10.4 g/dL — ABNORMAL LOW (ref 13.0–17.0)
Immature Granulocytes: 0 %
Lymphocytes Relative: 28 %
Lymphs Abs: 1.8 10*3/uL (ref 0.7–4.0)
MCH: 31.5 pg (ref 26.0–34.0)
MCHC: 32.9 g/dL (ref 30.0–36.0)
MCV: 95.8 fL (ref 80.0–100.0)
Monocytes Absolute: 0.7 10*3/uL (ref 0.1–1.0)
Monocytes Relative: 10 %
Neutro Abs: 3.7 10*3/uL (ref 1.7–7.7)
Neutrophils Relative %: 58 %
Platelets: 521 10*3/uL — ABNORMAL HIGH (ref 150–400)
RBC: 3.3 MIL/uL — ABNORMAL LOW (ref 4.22–5.81)
RDW: 13.5 % (ref 11.5–15.5)
WBC: 6.4 10*3/uL (ref 4.0–10.5)
nRBC: 0 % (ref 0.0–0.2)

## 2020-11-17 LAB — COMPREHENSIVE METABOLIC PANEL
ALT: 36 U/L (ref 0–44)
AST: 21 U/L (ref 15–41)
Albumin: 3.2 g/dL — ABNORMAL LOW (ref 3.5–5.0)
Alkaline Phosphatase: 264 U/L — ABNORMAL HIGH (ref 38–126)
Anion gap: 10 (ref 5–15)
BUN: 26 mg/dL — ABNORMAL HIGH (ref 6–20)
CO2: 28 mmol/L (ref 22–32)
Calcium: 9.5 mg/dL (ref 8.9–10.3)
Chloride: 102 mmol/L (ref 98–111)
Creatinine, Ser: 1.26 mg/dL — ABNORMAL HIGH (ref 0.61–1.24)
GFR, Estimated: >60 mL/min (ref >60)
Glucose, Bld: 112 mg/dL — ABNORMAL HIGH (ref 70–99)
Potassium: 4.4 mmol/L (ref 3.5–5.1)
Sodium: 140 mmol/L (ref 135–145)
Total Bilirubin: 0.4 mg/dL (ref 0.3–1.2)
Total Protein: 6.7 g/dL (ref 6.5–8.1)
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
## 2020-11-17 NOTE — Progress Notes (Signed)
Inpatient Rehabilitation  Patient information reviewed and entered into eRehab system by Babette Stum M. Elfrieda Espino, M.A., CCC/SLP, PPS Coordinator.  Information including medical coding, functional ability and quality indicators will be reviewed and updated through discharge.    

## 2020-11-17 NOTE — Progress Notes (Signed)
Speech Language Pathology Daily Session Note  Patient Details  Name: Ladarien Beeks MRN: [id number] Date of Birth: March 25, 1976  Today's Date: 11/17/2020 SLP Individual Time: 2633-3545 SLP Individual Time Calculation (min): 55 min  Short Term Goals: Week 1: SLP Short Term Goal 1 (Week 1): STG=LTG due to ELOS  Skilled Therapeutic Interventions: Skilled treatment session focused on cognitive goals. SLP facilitated session by administering the [facility] Mental Status Examination (SLUMS).  Patient scored  15/30 points with a score of 27 or above considered normal. Patient demonstrated deficits in attention and short-term recall. Patient was labile throughout the session and perseverative on "hating his life." SLP attempted to redirect the patient with intermittent success. Patient with intermittent word-finding deficits and imprecise consonants, suspect medication impacted function. PA aware. Patient left sitting EOB with alarm on and all needs within reach. Continue with current plan of care.       Pain Pain Assessment Pain Scale: 0-10 Pain Score: 7  Pain Type: Acute pain;Surgical pain Pain Location: Leg Pain Orientation: Left Pain Radiating Towards: Left arm Pain Descriptors / Indicators: Aching;Burning Pain Frequency: Constant Pain Onset: On-going Patients Stated Pain Goal: 0 Pain Intervention(s): Medication (See eMAR)  Therapy/Group: Individual Therapy  Denver Bentson 11/17/2020, 12:34 PM

## 2020-11-17 NOTE — Progress Notes (Signed)
Occupational Therapy Session Note  Patient Details  Name: Jason Alexander MRN: [id number] Date of Birth: [birth year]  Today's Date: 11/17/2020 OT Individual Time: 1300-1400 OT Individual Time Calculation (min): 60 min    Short Term Goals: Week 1:  OT Short Term Goal 1 (Week 1): STG= LTG d/t ELOS  Skilled Therapeutic Interventions/Progress Updates:    Patient seated in recliner, sister present for session.   Patient cooperative and able to recall all of his current precautions. Sit to stand from recliner and ambulation with left platform RW to/from recliner, shower bench with CGA.  He completes undressing, shower and dressing tasks with set up/CS, min cues for safe techniques and CGA for all tasks in stance for steadying.  Completed left UE proximal AAROM below shoulder height (with exception of ABD), elbow AAROM, sup/pron AAROM and wrist/hand AROM/PROM - reviewed use of wrist support to increase functional use of hand due to wrist drop.  He remained seated in recliner at close of session.  Call bell and tray table in reach.    Therapy Documentation Precautions:  Precautions Precautions: Fall Precaution Comments: weight bearing through L elbow only in platform RW; LUE ROM as tolerated -  No active shoulder abduction Required Braces or Orthoses: Other Brace Other Brace: L wrist cock-up splint; L radial n palsy splint for functional use/exercise, LUE sling for comfort Restrictions Weight Bearing Restrictions: Yes LUE Weight Bearing: Weight bear through elbow only LLE Weight Bearing: Weight bearing as tolerated Other Position/Activity Restrictions: Per ortho MD note -- LLE ROM as tolerated; LUE "aggressive PROM L hand/wrist/elbow, gentle forearm supination/pronation" and "LUE ROM as tolerated except no active shoulder abduction"   Therapy/Group: Individual Therapy  Barrie Lyme 11/17/2020, 7:49 AM
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
## 2020-11-17 NOTE — Progress Notes (Signed)
Physical Therapy Session Note  Patient Details  Name: Jason Alexander MRN: [id number] Date of Birth: August 15, 1976  Today's Date: 11/17/2020 PT Individual Time: 1016-1130 PT Individual Time Calculation (min): 74 min   Short Term Goals: Week 1:  PT Short Term Goal 1 (Week 1): =LTG due to ELOS  Skilled Therapeutic Interventions/Progress Updates:     Pt received supine in bed with pillow over face. Pt agrees to therapy. Reports soreness in L arm and L thigh. Number not provided. PT provides bracing, repositioning, and rest breaks to manage pain. Supine to sit mod(I) with use of bed features. Pt performs stand pivot tranfer to [facility] with close supervision and cues on positioning. WC transport to therapy gym for time management. Pt ambulates x2 bouts of 180' with platform RW, with PT providing supervision and cues for body mechanics and gait pattern. PT also adjusts pt's RW for improved fit to allow for optimal posture and prevent muscular imbalance. Pt verbalizes improved feel with RW and also increased soreness in L thigh with ambulation. Pt verbalizes need to void urine. WC transport to bathroom and pt performs sit to stand transfer with supervision. PT cues for symmetrical WB through bilateral lower extremities. WC transport back to dayroom. Pt performs squat pivot transfer to Nustep with supervision. Pt performs Nustep at workload of of 4 with steps per minute >40. Nustep performed for strength and endurance training as well as L lower extremity AAROM. Pt verbalizes soreness in L leg but really enjoys activity and verbalizes improved feelings of strength end energy. Stand pivot transfer back to [facility] with supervision. Pt lett in [facility] with all needs within reach.  Therapy Documentation Precautions:  Precautions Precautions: Fall Precaution Comments: weight bearing through L elbow only in platform RW; LUE ROM as tolerated -  No active shoulder abduction Required Braces or Orthoses: Other Brace Other Brace: L  wrist cock-up splint; L radial n palsy splint for functional use/exercise, LUE sling for comfort Restrictions Weight Bearing Restrictions: Yes LUE Weight Bearing: Weight bear through elbow only LLE Weight Bearing: Weight bearing as tolerated Other Position/Activity Restrictions: Per ortho MD note -- LLE ROM as tolerated; LUE "aggressive PROM L hand/wrist/elbow, gentle forearm supination/pronation" and "LUE ROM as tolerated except no active shoulder abduction"   Therapy/Group: Individual Therapy  Beau Fanny, PT, DPT 11/17/2020, 11:08 AM

## 2020-11-17 NOTE — Progress Notes (Signed)
Patient information reviewed and entered into eRehab System by Becky Suliman Termini, PPS coordinator. Information including medical coding, function ability, and quality indicators will be reviewed and updated through discharge.   

## 2020-11-17 NOTE — Care Management (Signed)
Inpatient Rehabilitation Center Individual Statement of Services  Patient Name:  Jason Alexander  Date:  11/17/2020  Welcome to the Inpatient Rehabilitation Center.  Our goal is to provide you with an individualized program based on your diagnosis and situation, designed to meet your specific needs.  With this comprehensive rehabilitation program, you will be expected to participate in at least 3 hours of rehabilitation therapies Monday-Friday, with modified therapy programming on the weekends.  Your rehabilitation program will include the following services:  Physical Therapy (PT), Occupational Therapy (OT), Speech Therapy (ST), 24 hour per day rehabilitation nursing, Therapeutic Recreaction (TR), Psychology, Neuropsychology, Care Coordinator, Rehabilitation Medicine, Nutrition Services, Pharmacy Services and Other  Weekly team conferences will be held on Tuesdays to discuss your progress.  Your Inpatient Rehabilitation Care Coordinator will talk with you frequently to get your input and to update you on team discussions.  Team conferences with you and your family in attendance may also be held.  Expected length of stay: 5-7 days    Overall anticipated outcome: Supervision  Depending on your progress and recovery, your program may change. Your Inpatient Rehabilitation Care Coordinator will coordinate services and will keep you informed of any changes. Your Inpatient Rehabilitation Care Coordinator's name and contact numbers are listed  below.  The following services may also be recommended but are not provided by the Inpatient Rehabilitation Center:   Driving Evaluations  Home Health Rehabiltiation Services  Outpatient Rehabilitation Services  Vocational Rehabilitation   Arrangements will be made to provide these services after discharge if needed.  Arrangements include referral to agencies that provide these services.  Your insurance has been verified to be:  [place] [employer]  [employer]  Your primary doctor is:  Mechele Claude  Pertinent information will be shared with your doctor and your insurance company.  Inpatient Rehabilitation Care Coordinator:  Susie Cassette [phone] or ([phone]  Information discussed with and copy given to patient by: Gretchen Short, 11/17/2020, 8:30 AM

## 2020-11-18 ENCOUNTER — Inpatient Hospital Stay (HOSPITAL_COMMUNITY): Payer: Medicaid Other

## 2020-11-18 ENCOUNTER — Inpatient Hospital Stay (HOSPITAL_COMMUNITY): Payer: Medicaid Other | Admitting: Occupational Therapy

## 2020-11-18 ENCOUNTER — Inpatient Hospital Stay (HOSPITAL_COMMUNITY): Payer: Medicaid Other | Admitting: Physical Therapy

## 2020-11-18 DIAGNOSIS — S72352A Displaced comminuted fracture of shaft of left femur, initial encounter for closed fracture: Secondary | ICD-10-CM | POA: Diagnosis not present

## 2020-11-18 DIAGNOSIS — F411 Generalized anxiety disorder: Secondary | ICD-10-CM | POA: Diagnosis not present

## 2020-11-18 DIAGNOSIS — G5632 Lesion of radial nerve, left upper limb: Secondary | ICD-10-CM | POA: Diagnosis not present

## 2020-11-18 DIAGNOSIS — S42352S Displaced comminuted fracture of shaft of humerus, left arm, sequela: Secondary | ICD-10-CM | POA: Diagnosis not present

## 2020-11-18 DIAGNOSIS — S52352S Displaced comminuted fracture of shaft of radius, left arm, sequela: Secondary | ICD-10-CM | POA: Diagnosis not present

## 2020-11-18 NOTE — Plan of Care (Signed)
Behavioral Plan   [place] Level: VII  Behavior to decrease/ eliminate:  Restless Lethargic Labile  Changes to environment:  Lights off, sensitive to light Likes to be up in the reclner May have breaks unlocked in recliner so he can rock, but breaks need to be back on before transfers 2 bed rails up  Interventions: Bed alarm, chair pad alarm, all needs within reach   Recommendations for interactions with patient: Redirect to task at hand and avoid conversations about his past and family Topic maintenance   Attendees:   Feliberto Gottron, SLP Kearney Hard, OT Malachi Pro, PT

## 2020-11-18 NOTE — Progress Notes (Signed)
Occupational Therapy Session Note  Patient Details  Name: Jason Alexander MRN: [id number] Date of Birth: Mar 17, 1976  Today's Date: 11/18/2020 OT Individual Time: 1400-1445 OT Individual Time Calculation (min): 45 min    Short Term Goals: Week 1:  OT Short Term Goal 1 (Week 1): STG= LTG d/t ELOS  Skilled Therapeutic Interventions/Progress Updates:    Patient greeted seated in recliner and agreeable to OT treatment session. Pt wanted to shower today. Pt ambulated with platform RW to bathroom w/ CGA. Verbal cues for safety awareness to sit down to stop out of pants and doff shirt. Min A for bathing tasks limited use of L UE. Educated on modified dressing strategies with pt demonstrating understanding, but still needing min A. Pt returned to recliner with CGA stand-pivot. OT provided PROM to L hand/wrist/./elbow. Pt left seated in recliner with alarm pad on, call bell in reach, and needs met  Therapy Documentation Precautions:  Precautions Precautions: Fall Precaution Comments: weight bearing through L elbow only in platform RW; LUE ROM as tolerated -  No active shoulder abduction Required Braces or Orthoses: Other Brace Other Brace: L wrist cock-up splint; L radial n palsy splint for functional use/exercise, LUE sling for comfort Restrictions Weight Bearing Restrictions: Yes LUE Weight Bearing: Non weight bearing LLE Weight Bearing: Weight bearing as tolerated Other Position/Activity Restrictions: Per ortho MD note -- LLE ROM as tolerated; LUE "aggressive PROM L hand/wrist/elbow, gentle forearm supination/pronation" and "LUE ROM as tolerated except no active shoulder abduction" Pain: Pain Assessment Pain Scale: 0-10 Pain Score: 7 Pain Type: Acute pain;Surgical pain Pain Location: Leg Pain Orientation: Left Pain Descriptors / Indicators: Burning Pain Frequency: Constant Pain Onset: On-going Patients Stated Pain Goal: 5 Pain Intervention(s): Rest/repositioned   Therapy/Group:  Individual Therapy  Valma Cava 11/18/2020, 3:19 PM
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
## 2020-11-18 NOTE — Progress Notes (Signed)
Physical Therapy Session Note  Patient Details  Name: Jason Alexander MRN: [id number] Date of Birth: 08-07-1976  Today's Date: 11/18/2020 PT Individual Time: 4235-3614 PT Individual Time Calculation (min): 42 min   Short Term Goals: Week 1:  PT Short Term Goal 1 (Week 1): =LTG due to ELOS  Skilled Therapeutic Interventions/Progress Updates:     Pt received seated at EOB with RN staff present. Reports pain in L knee and L thigh. PT provides mobility and rest breaks as needed to manage pain symptoms. Pt performs stand pivot transfer to [facility] with platform RW and supervision with cues on sequencing and positioning. Pt performs stand pivot to Nustep without AD and with CGA. Pt performs Nustep for AAROM of L leg, as well as strengthening and endurance training. Pt completes x11:30 total with SPM >50 at workload of 5. Pt verblizes that he really enjoys Nustep and feels that it benefits his L leg. Pt ambulates to mat table with platform walker and cues on posture and body mechanics. Pt assistive device training with quad cane, using mirror for visual feedback. Pt practices taking forward and backward steps with block practices and CGA/minA from PT. Pt then ambulates 50' with quad cane and minA. WC transport back to room. Stand step transfer back to recliner with CGA. Left seated in recliner with all needs within reach.  Therapy Documentation Precautions:  Precautions Precautions: Fall Precaution Comments: weight bearing through L elbow only in platform RW; LUE ROM as tolerated -  No active shoulder abduction Required Braces or Orthoses: Other Brace Other Brace: L wrist cock-up splint; L radial n palsy splint for functional use/exercise, LUE sling for comfort Restrictions Weight Bearing Restrictions: Yes LUE Weight Bearing: Non weight bearing LLE Weight Bearing: Weight bearing as tolerated Other Position/Activity Restrictions: Per ortho MD note -- LLE ROM as tolerated; LUE "aggressive PROM L  hand/wrist/elbow, gentle forearm supination/pronation" and "LUE ROM as tolerated except no active shoulder abduction"   Therapy/Group: Individual Therapy  Beau Fanny, PT, DPT 11/18/2020, 12:32 PM
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
## 2020-11-18 NOTE — Progress Notes (Signed)
Speech Language Pathology Daily Session Note  Patient Details  Name: Jason Alexander MRN: [id number] Date of Birth: 10-20-1976  Today's Date: 11/18/2020 SLP Individual Time: 0803-0900 SLP Individual Time Calculation (min): 57 min  Short Term Goals: Week 1: SLP Short Term Goal 1 (Week 1): STG=LTG due to ELOS  Skilled Therapeutic Interventions:Skilled ST services focused on cognitive skills. Pt demonstrated ability to recall past two ST sessions with mod A fading to min A verbal cues. SLP facilitated short term recall strategies and alternating attention skills. Pt was able to name foods in alphabetical order in 2-10 minute intervals with supervision A semantic cues for word finding strategies, while recalling details from short paragraphs. Pt was able to recall inclusion/exclusion 3 details from short paragraphs utilizing visualization strategy with min A verbal cues. Pt was tangential and perseverative pertaining to reason for admission throughout session, but was moderatly easy to redirect. Pt was resistant to SLP setting the bed alarm but eventually agreed with no further issues after throughout explanation was given. Pt was left in room with call bell within reach and bed alarm set. SLP recommends to continue skilled services.      Pain Pain Assessment Pain Scale: 0-10 Pain Score: 0-No pain Pain Type: Acute pain;Surgical pain Pain Location: Leg Pain Orientation: Left Pain Descriptors / Indicators: Aching Pain Frequency: Constant Pain Onset: On-going Patients Stated Pain Goal: 0 Pain Intervention(s): Medication (See eMAR)  Therapy/Group: Individual Therapy  Marylynn Rigdon  [facility] 11/18/2020, 12:52 PM
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
## 2020-11-19 ENCOUNTER — Inpatient Hospital Stay (HOSPITAL_COMMUNITY): Payer: Medicaid Other

## 2020-11-19 ENCOUNTER — Inpatient Hospital Stay (HOSPITAL_COMMUNITY): Payer: Medicaid Other | Admitting: Speech Pathology

## 2020-11-19 DIAGNOSIS — S42352S Displaced comminuted fracture of shaft of humerus, left arm, sequela: Secondary | ICD-10-CM | POA: Diagnosis not present

## 2020-11-19 DIAGNOSIS — G5632 Lesion of radial nerve, left upper limb: Secondary | ICD-10-CM | POA: Diagnosis not present

## 2020-11-19 DIAGNOSIS — S52352S Displaced comminuted fracture of shaft of radius, left arm, sequela: Secondary | ICD-10-CM | POA: Diagnosis not present

## 2020-11-19 DIAGNOSIS — S72352A Displaced comminuted fracture of shaft of left femur, initial encounter for closed fracture: Secondary | ICD-10-CM | POA: Diagnosis not present

## 2020-11-19 DIAGNOSIS — F411 Generalized anxiety disorder: Secondary | ICD-10-CM | POA: Diagnosis not present

## 2020-11-19 NOTE — Plan of Care (Signed)
  Problem: RH Expression Communication Goal: LTG Patient will increase word finding of common (SLP) Description: LTG:  Patient will increase word finding of common objects/daily info/abstract thoughts with cues using compensatory strategies (SLP). Flowsheets (Taken 11/19/2020 [phone]) LTG: Patient will increase word finding of common (SLP): Supervision Note: Goal downgraded due to short length of stay   Problem: RH Memory Goal: LTG Patient will use memory compensatory aids to (SLP) Description: LTG:  Patient will use memory compensatory aids to recall biographical/new, daily complex information with cues (SLP) Flowsheets (Taken 11/19/2020 0634) LTG: Patient will use memory compensatory aids to (SLP): Supervision Note: Goal downgraded due to short length of stay   Problem: RH Attention Goal: LTG Patient will demonstrate this level of attention during functional activites (SLP) Description: LTG:  Patient will will demonstrate this level of attention during functional activites (SLP) Flowsheets (Taken 11/19/2020 [phone]) Patient will demonstrate during cognitive/linguistic activities the attention type of: Sustained LTG: Patient will demonstrate this level of attention during cognitive/linguistic activities with assistance of (SLP): Supervision Note: Goal downgraded due to short length of stay

## 2020-11-19 NOTE — Progress Notes (Signed)
Occupational Therapy Session Note  Patient Details  Name: Jason Alexander MRN: [id number] Date of Birth: 1976-09-08  Today's Date: 11/19/2020 OT Individual Time: 1115-1200 OT Individual Time Calculation (min): 45 min    Short Term Goals: Week 1:  OT Short Term Goal 1 (Week 1): STG= LTG d/t ELOS  Skilled Therapeutic Interventions/Progress Updates:    1;1. Pt received in bed agreeable to OT. Pt completes ambulation with PRFW into bathroom with S overall with VC for placement over toilet for standing void. Pt shaves with set up and VC for one handed dispensing of shaving cream. Razors disposed of and trash bag removed from room. Pt completes ambulation in apartment simulator to standard couch as this Is what pt will be sleeping on at home. Pt able to swing legs up onto couch. Edu re not propping LUE onto armrest as this abducts the shoulder. Instead support elbow with pillow propped underneath. Exited session with pt seated in bed, exit alarm on and call light in reach   Therapy Documentation Precautions:  Precautions Precautions: Fall Precaution Comments: weight bearing through L elbow only in platform RW; LUE ROM as tolerated -  No active shoulder abduction Required Braces or Orthoses: Other Brace Other Brace: L wrist cock-up splint; L radial n palsy splint for functional use/exercise, LUE sling for comfort Restrictions Weight Bearing Restrictions: Yes LUE Weight Bearing: Non weight bearing LLE Weight Bearing: Weight bearing as tolerated Other Position/Activity Restrictions: Per ortho MD note -- LLE ROM as tolerated; LUE "aggressive PROM L hand/wrist/elbow, gentle forearm supination/pronation" and "LUE ROM as tolerated except no active shoulder abduction" General:   Vital Signs:   Pain:   ADL: ADL Eating: Set up Where Assessed-Eating: Edge of bed Grooming: Setup Where Assessed-Grooming: Sitting at sink Upper Body Bathing: Minimal assistance,Minimal cueing Where Assessed-Upper  Body Bathing: Shower Lower Body Bathing: Minimal assistance,Minimal cueing Where Assessed-Lower Body Bathing: Shower Upper Body Dressing: Minimal assistance,Minimal cueing Where Assessed-Upper Body Dressing: Sitting at sink Lower Body Dressing: Minimal cueing,Minimal assistance Where Assessed-Lower Body Dressing: Standing at sink,Sitting at sink Toileting: Minimal assistance,Minimal cueing Where Assessed-Toileting: Teacher, adult education: Curator Method: Event organiser: Minimal Curator Method: [employer]    Praxis   Exercises:   Other Treatments:     Therapy/Group: Individual Therapy  Shon Hale 11/19/2020, 11:16 AM
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
## 2020-11-19 NOTE — Progress Notes (Signed)
Speech Language Pathology Daily Session Note  Patient Details  Name: Jason Alexander MRN: [id number] Date of Birth: 27-Sep-1976  Today's Date: 11/19/2020 SLP Individual Time: 0174-9449 SLP Individual Time Calculation (min): 55 min  Short Term Goals: Week 1: SLP Short Term Goal 1 (Week 1): STG=LTG due to ELOS  Skilled Therapeutic Interventions: Skilled treatment session focused on cognitive goals. SLP facilitated session by providing extra time and Min verbal cues for functional problem solving during a basic money and medication management task. Patient's function was impacted by decreased ability to keep his eyes open due to reported light sensitivity, restlessness with constant movement and decreased attention to task due to poor topic maintenance and verbosity. Patient left upright in bed with alarm on and all needs within reach. Continue with current plan of care.       Pain No/Denies Pain   Therapy/Group: Individual Therapy  Faryal Marxen 11/19/2020, 12:48 PM

## 2020-11-19 NOTE — Progress Notes (Addendum)
Patient ID: Jason Alexander, male   DOB: 07-Nov-1976, 44 y.o.   MRN: [id number]  PER EMR, psychiatry consult states pt continues to meet inpatient psychiatric admission, and will be re-evaluated on Friday for possible admission on Saturday.   *Update- per psychiatric consult, pt appears to be appropriate candidate for [facility] medicine unit and will be re-evaluated again on Friday. If unable to meet criteria for inpatient psych, pt will be discharged to home to his sister's home, therapies as recommended, DME, and outpatient services.  Cecile Sheerer, MSW, LCSWA Office: [phone] Cell: [phone] Fax: [phone]

## 2020-11-19 NOTE — Progress Notes (Signed)
Physical Therapy Session Note  Patient Details  Name: Jason Alexander MRN: [id number] Date of Birth: 1976/11/08  Today's Date: 11/19/2020 PT Individual Time: 0901-0957 PT Individual Time Calculation (min): 56 min   Short Term Goals: Week 1:  PT Short Term Goal 1 (Week 1): =LTG due to ELOS  Skilled Therapeutic Interventions/Progress Updates:     Pt received supine in bed and agrees to therapy. Reports soreness pain in L arm and L leg. Number not provided. PT provides mobility and rest breaks to manage pain. Stand pivot transfer to [facility] with cues on hand placement and sequencing. WC transport to gym for time management. Pt transfer to mat table and transitions to supine. Pt performs supine therex for L upper extremity, including eccentric (triceps) elbow flexion  against gravity and then elbow extension with PT providing manual facilitation of full elbow extension. Eccentric shoulder extension against gravity. Pt then attempts elbow flexion against gravity (biceps) and is unable to complete. Positioning adjusted and pt performs elbow flexion with gravity minimized with focus on elbow flexors. Pt ambulates x30' with quad cane, PT providing cues on assistive device management, gait pattern, and body mechanics, including increased activation of L hip abductors during stance phase. Extended seated rest break prior to ambulating additional 110' with quad cane, then 40' with WC follow and PT cuing for rest break due to deteriorating gait pattern. Pt performs stand step transfer to bed and left seated at EOB with alarm intact and all needs within reach.  Therapy Documentation Precautions:  Precautions Precautions: Fall Precaution Comments: weight bearing through L elbow only in platform RW; LUE ROM as tolerated -  No active shoulder abduction Required Braces or Orthoses: Other Brace Other Brace: L wrist cock-up splint; L radial n palsy splint for functional use/exercise, LUE sling for  comfort Restrictions Weight Bearing Restrictions: Yes LUE Weight Bearing: Non weight bearing LLE Weight Bearing: Weight bearing as tolerated Other Position/Activity Restrictions: Per ortho MD note -- LLE ROM as tolerated; LUE "aggressive PROM L hand/wrist/elbow, gentle forearm supination/pronation" and "LUE ROM as tolerated except no active shoulder abduction"    Therapy/Group: Individual Therapy  Beau Fanny, PT, DPT 11/19/2020, 9:37 AM
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
## 2020-11-19 NOTE — Progress Notes (Signed)
Physical Therapy Session Note  Patient Details  Name: Jason Alexander MRN: [id number] Date of Birth: August 05, 1976  Today's Date: 11/19/2020 PT Individual Time: 1446-1510 PT Individual Time Calculation (min): 24 min   Short Term Goals: Week 1:  PT Short Term Goal 1 (Week 1): =LTG due to ELOS  Skilled Therapeutic Interventions/Progress Updates:     Pt received seated at EOB and agrees to therapy. Pain evident in L hip and thigh. Number not provided. PT provides rest breaks to manage pain symptoms. Stand pivot to [facility] with close supervision. Pt taken to hospital unit he was housed at prior to coming to rehab so that he could return platform RW and demonstrate his progress to staff he had gotten to know. Sit to stand multiple reps with supervision and cues on sequencing. Pt ambulates 20' with 180 degree turn with CGA and cues for safety and positioning prior to attempt to sit back in [facility]. Following rest break, pt ambulates additional 100', with instructions to ambulates to fatigue or until gait pattern begins to deteriorate. PT notices compensations and less stability around 100' and pt instructed to take rest break. Stand pivot back to bed with supervision. Left seated at EOB with alarm intact and all needs within reach.  Therapy Documentation Precautions:  Precautions Precautions: Fall Precaution Comments: weight bearing through L elbow only in platform RW; LUE ROM as tolerated -  No active shoulder abduction Required Braces or Orthoses: Other Brace Other Brace: L wrist cock-up splint; L radial n palsy splint for functional use/exercise, LUE sling for comfort Restrictions Weight Bearing Restrictions: Yes LUE Weight Bearing: Non weight bearing LLE Weight Bearing: Weight bearing as tolerated Other Position/Activity Restrictions: Per ortho MD note -- LLE ROM as tolerated; LUE "aggressive PROM L hand/wrist/elbow, gentle forearm supination/pronation" and "LUE ROM as tolerated except no active shoulder  abduction"   Therapy/Group: Individual Therapy  Beau Fanny, PT, DPT 11/19/2020, 3:14 PM
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
## 2020-11-20 ENCOUNTER — Encounter (HOSPITAL_COMMUNITY): Payer: Medicaid Other | Admitting: Occupational Therapy

## 2020-11-20 ENCOUNTER — Ambulatory Visit (HOSPITAL_COMMUNITY): Payer: Medicaid Other

## 2020-11-20 ENCOUNTER — Inpatient Hospital Stay (HOSPITAL_COMMUNITY): Payer: Medicaid Other | Admitting: Speech Pathology

## 2020-11-20 ENCOUNTER — Inpatient Hospital Stay (HOSPITAL_COMMUNITY): Payer: Medicaid Other

## 2020-11-20 ENCOUNTER — Other Ambulatory Visit (HOSPITAL_COMMUNITY): Payer: Self-pay | Admitting: Physician Assistant

## 2020-11-20 DIAGNOSIS — S72352A Displaced comminuted fracture of shaft of left femur, initial encounter for closed fracture: Secondary | ICD-10-CM | POA: Diagnosis not present

## 2020-11-20 DIAGNOSIS — S52352S Displaced comminuted fracture of shaft of radius, left arm, sequela: Secondary | ICD-10-CM | POA: Diagnosis not present

## 2020-11-20 DIAGNOSIS — S42352S Displaced comminuted fracture of shaft of humerus, left arm, sequela: Secondary | ICD-10-CM | POA: Diagnosis not present

## 2020-11-20 DIAGNOSIS — G5632 Lesion of radial nerve, left upper limb: Secondary | ICD-10-CM | POA: Diagnosis not present

## 2020-11-20 DIAGNOSIS — F411 Generalized anxiety disorder: Secondary | ICD-10-CM | POA: Diagnosis not present

## 2020-11-20 MED ORDER — ADULT MULTIVITAMIN W/MINERALS CH: 1.0000 | ORAL_TABLET | Freq: Every day | ORAL | Status: DC

## 2020-11-20 MED ORDER — POLYETHYLENE GLYCOL 3350 17 G PO PACK: 17.0000 g | PACK | Freq: Every day | ORAL | 0 refills | Status: DC

## 2020-11-20 MED ORDER — PREGABALIN 75 MG PO CAPS
200.0000 mg | ORAL_CAPSULE | Freq: Two times a day (BID) | ORAL | Status: DC
  Administered 2020-11-20 – 2020-11-22 (×4): 200 mg via ORAL
  Filled 2020-11-20 (×3): qty 1
  Filled 2020-11-20: qty 2

## 2020-11-20 MED ORDER — DULOXETINE HCL 30 MG PO CPEP: 30.0000 mg | ORAL_CAPSULE | Freq: Two times a day (BID) | ORAL | 3 refills | Status: DC

## 2020-11-20 MED ORDER — PREGABALIN 200 MG PO CAPS: 200.0000 mg | ORAL_CAPSULE | Freq: Two times a day (BID) | ORAL | 0 refills | Status: DC

## 2020-11-20 MED ORDER — QUETIAPINE FUMARATE 50 MG PO TABS: 50.0000 mg | ORAL_TABLET | Freq: Every day | ORAL | 0 refills | Status: DC

## 2020-11-20 MED ORDER — CYCLOBENZAPRINE HCL 5 MG PO TABS: 5.0000 mg | ORAL_TABLET | Freq: Three times a day (TID) | ORAL | 0 refills | Status: DC

## 2020-11-20 MED ORDER — ASCORBIC ACID 1000 MG PO TABS: 1000.0000 mg | ORAL_TABLET | Freq: Every day | ORAL | 0 refills | Status: DC

## 2020-11-20 MED ORDER — TRAMADOL HCL 50 MG PO TABS: 50.0000 mg | ORAL_TABLET | Freq: Four times a day (QID) | ORAL | 0 refills | Status: DC

## 2020-11-20 MED ORDER — ACETAMINOPHEN 325 MG PO TABS: 325.0000 mg | ORAL_TABLET | ORAL | Status: AC | PRN

## 2020-11-20 MED ORDER — OXYCODONE HCL 5 MG PO TABS: 5.0000 mg | ORAL_TABLET | ORAL | 0 refills | Status: DC | PRN

## 2020-11-20 MED ORDER — DOCUSATE SODIUM 100 MG PO CAPS: 100.0000 mg | ORAL_CAPSULE | Freq: Two times a day (BID) | ORAL | 0 refills | Status: DC

## 2020-11-20 MED ORDER — PREGABALIN 100 MG PO CAPS: 100.0000 mg | ORAL_CAPSULE | Freq: Three times a day (TID) | ORAL | 0 refills | Status: DC

## 2020-11-20 MED ORDER — HYDROXYZINE HCL 25 MG PO TABS: 25.0000 mg | ORAL_TABLET | Freq: Two times a day (BID) | ORAL | 0 refills | Status: DC | PRN

## 2020-11-20 MED ORDER — VITAMIN D3 25 MCG PO TABS: 2000.0000 [IU] | ORAL_TABLET | Freq: Two times a day (BID) | ORAL | 0 refills | Status: DC

## 2020-11-20 MED ORDER — LIDOCAINE 5 % EX PTCH: 1.0000 | MEDICATED_PATCH | Freq: Every day | CUTANEOUS | 0 refills | Status: DC

## 2020-11-20 NOTE — Progress Notes (Signed)
Patient refused morning dose of miralax and stated he had a bowel on 12/15. No complications noted at this time.  Jay Schlichter, LPN

## 2020-11-20 NOTE — Progress Notes (Signed)
Speech Language Pathology Daily Session Note  Patient Details  Name: Golden Gilreath MRN: [id number] Date of Birth: 07-Dec-1975  Today's Date: 11/20/2020 SLP Individual Time: 1330-1415 SLP Individual Time Calculation (min): 45 min  Short Term Goals: Week 1: SLP Short Term Goal 1 (Week 1): STG=LTG due to ELOS  Skilled Therapeutic Interventions: Skilled treatment session focused on cognitive goals. SLP facilitated session by providing supervision verbal cues for sustained attention to maximize safety during mobility while ambulating to the commode. Patient was continent of bladder. SLP also facilitated session by providing supervision verbal cues for recall of his current medications and their functions. Recommend organizing a QD pill box during next session. Patient continues to require overall supervision level verbal cues for redirection to task due to intermittent verbosity which patient can self-monitor but requires cues to self-correct. Patient handed off to PT. Continue with current plan of care.      Pain Headache, patient premedicated   Therapy/Group: Individual Therapy  Kassie Keng 11/20/2020, 2:56 PM

## 2020-11-20 NOTE — Progress Notes (Signed)
Occupational Therapy Session Note  Patient Details  Name: Jason Alexander MRN: [id number] Date of Birth: 06/12/1976  Today's Date: 11/20/2020 OT Individual Time: 1102-1200 OT Individual Time Calculation (min): 58 min    Short Term Goals: Week 1:  OT Short Term Goal 1 (Week 1): STG= LTG d/t ELOS  Skilled Therapeutic Interventions/Progress Updates:    Patient greeted seated in wc with sister present for family education. Pt brought down to tub room and discussed bathroom shower transfers. Attempted to step over tub, but pt was not abe to tolerate full weight bearing on L side. Initially, sister did not think a tub bench would fit in her bathroom, but after seeing that he will definitely need a tub bench to be able to transfer into shower, she now thinks it would work. Pt able to complete tub bench transfer with supervision. Pt's sister is going to order tub bench privately. Pt brought back to room and ambulated into bathroom with quad cane and close supervision. Bathing from shower bench with supervision. Dressing completed from wc with CGA for LB dressing 2/2 L hip pain. Pt ambulated to the recliner with quad cane and close supervision. Pt left seated in recliner with chair alarm on, call bell in reach, and needs met.   Therapy Documentation Precautions:  Precautions Precautions: Fall Precaution Comments: weight bearing through L elbow only in platform RW; LUE ROM as tolerated -  No active shoulder abduction Required Braces or Orthoses: Other Brace Other Brace: L wrist cock-up splint; L radial n palsy splint for functional use/exercise, LUE sling for comfort Restrictions Weight Bearing Restrictions: Yes LUE Weight Bearing: Non weight bearing LLE Weight Bearing: Weight bearing as tolerated Other Position/Activity Restrictions: Per ortho MD note -- LLE ROM as tolerated; LUE "aggressive PROM L hand/wrist/elbow, gentle forearm supination/pronation" and "LUE ROM as tolerated except no active  shoulder abduction" Pain:  Patient reports pain in L hip, no number given, but reports pain is high. Therapy/Group: Individual Therapy  Valma Cava 11/20/2020, 12:09 PM

## 2020-11-20 NOTE — Progress Notes (Signed)
Patient and family voiced interest of patient receiving covid-19 vaccination before discharge. PA stated to approve of it being given. Facility protocol for it to be given the following day at 1600. Will pass information to next oncoming nurse. No complications noted at this time.  Jay Schlichter, LPN

## 2020-11-20 NOTE — Progress Notes (Addendum)
Patient ID: Jason Alexander, male   DOB: 05/05/1976, 44 y.o.   MRN: [id number]  Per medical team, pt is medically cleared to d/c to inpatient psychiatry if approved. SW spoke with [facility] [phone]) to inform on above about pt being medically cleared. Pt will continue to be reviewed to determine if appropriate.   SW called pt sister Reeves Forth [phone] to inform inpatient psych admission is pending, and will be informed if accepted. She states pt can continue to d/c to her home even if not accepted. D/C address: [address] [place], [place] [address]. Reports closest hospital for outpatient therapies in [facility]. SW informed will send referral here. SW informed DME will be ordered: TTB, and quad cane.   *SW sent outpatient PT/OT/SLp referral to [facility] (p:[phone]/f:[phone]). SW sent DME order: quad cane, and TTB to [facility] via parachute. SW later added RW with left platform attachment per PT.   SW met with pt to inform on above.   *SW received updates from [facility] that they are out of stock of RW with left platform attachment and sister will pick up from retail store on Monday. SW informed PT.   SW received updates from Lynnda Shields, Animal nutritionist at [facility] who reported that pt is more appropriate for [facility], however there are no beds available for this unit at this time, and pt is too medically complex for [facility]. SW will need to follow up for bed availability at [facility] by contacting the TTS counselor at [phone].  Loralee Pacas, MSW, [place] Office: [phone] Cell: [phone] Fax: [phone]

## 2020-11-20 NOTE — Progress Notes (Signed)
Physical Therapy Session Note  Patient Details  Name: Jason Alexander MRN: [id number] Date of Birth: 04/06/1976  Today's Date: 11/20/2020 PT Individual Time: 1000-1058 and 4098-1191 PT Individual Time Calculation (min): 58 min and 41 min  Short Term Goals: Week 1:  PT Short Term Goal 1 (Week 1): =LTG due to ELOS  Skilled Therapeutic Interventions/Progress Updates:      1st Session: Pt received seated at EOB and agreeable to therapy. Sister present for family education. Pt performs stand pivot transfer to [facility] with supervision and no AD. WC transport to gym for time management. Pt performs car transfer with PT demonstration prior, then verbal cues on positioning and sequencing of transfer. Pt then complete x4 6" steps with PT providing CGA  and use of quad cane. Pt's sister then provides CGA and pt performs additional 8 steps with quad cane.  PT provides verbal cues on sequencing and cane placement for safety. WC transport outside for gait training over unlevel surfaces. Pt ambulates 100' with CGA. During seated rest break pt verbalizes increased pain in L hip and thigh. PT brings WC for transport back inside. Following rest pt verbalizes improved symptoms. Pt left seated in WC with all needs within reach.   2nd Session: Pt received seated in recliner and agrees to therapy. Reports pain in L hip. Number not provided. PT provides mobility and rest breaks to manage pain. Pt performs stand step transfer to [facility] with quad cane and cues on sequencing and positioning. Pt transfers to Nustep stand pivot with supervision. Pt performs Nustep for AAROM of L leg, as well as strengthening and endurance training. Pt completes 12:00 at workload of 3 with SPM>50, then increases workload to 5 for 8:00 with SPM>40. Stand pivot back to [facility] and transport back to room. Pt ambulates x15' to toilet with quad cane and supervision. Verbalizes increase in pain so PT provides pt with platform RW in bathroom to use for transfer  back to bed. Pt left on toilet with RN aware.    Therapy Documentation Precautions:  Precautions Precautions: Fall Precaution Comments: weight bearing through L elbow only in platform RW; LUE ROM as tolerated -  No active shoulder abduction Required Braces or Orthoses: Other Brace Other Brace: L wrist cock-up splint; L radial n palsy splint for functional use/exercise, LUE sling for comfort Restrictions Weight Bearing Restrictions: Yes LUE Weight Bearing: Non weight bearing LLE Weight Bearing: Weight bearing as tolerated Other Position/Activity Restrictions: Per ortho MD note -- LLE ROM as tolerated; LUE "aggressive PROM L hand/wrist/elbow, gentle forearm supination/pronation" and "LUE ROM as tolerated except no active shoulder abduction"   Therapy/Group: Individual Therapy  Beau Fanny, PT, DPT 11/20/2020, 3:23 PM

## 2020-11-20 NOTE — Progress Notes (Signed)
Speech Language Pathology Discharge Summary  Patient Details  Name: Jason Alexander MRN: [id number] Date of Birth: Oct 17, 1976  Today's Date: 11/21/2020 SLP Individual Time: 1100-1200 SLP Individual Time Calculation (min): 60 min   Skilled Therapeutic Interventions:  Patient seen to address cognitive function goals with simulated pill box task. Patient was able to recall where medication list was that he completed yesterday but response was delayed until he finally (correctly) stated "bottom drawer!" He was able to follow medication list to put pills into correct place in 4x weekly pill box (we only completed for one day) but required frequent verbal cues to redirect his attention as he would start talking about unrelated topics. Patient was aware of discharge plan to behavioral health and he told SLP that he had spoken with his rehab MD and the neuropsychologist here on CIR  "I agreed to go". Patient requested assistance to bathroom and he demonstrated good safety awareness and use of adaptive equipment (quad cane). Patient to be discharged to [facility] today then plan is to go to his sister's. He will benefit from outpatient SLP intervention in future.  Patient has met 3 of 3 long term goals.  Patient to discharge at overall Supervision level.   Reasons goals not met: N/A   Clinical Impression/Discharge Summary: Patient has made functional gains and has met 3 of 3 LTGs this admission. Currently, patient demonstrates behaviors consistent with a Rancho Level VII and requires overall supervision level verbal cues to complete functional and familiar tasks safely in regards to recall of functional information and sustained attention. Patient also requires overall extra time and supervision level verbal cues for word-finding at the conversation level. Patient education is complete and patient will discharge with supervision from family. Patient would benefit from skilled f/u SLP services to  maximize his cognitive-linguistic functioning and overall functional independence in order to reduce caregiver burden.   Care Partner:  Caregiver Able to Provide Assistance: Yes  Type of Caregiver Assistance: Physical;Cognitive  Recommendation:  Outpatient SLP (Intermittent supervision)  Rationale for SLP Follow Up: Reduce caregiver burden;Maximize cognitive function and independence;Maximize functional communication   Equipment: N/A   Reasons for discharge: Discharged from hospital;Treatment goals met   Patient/Family Agrees with Progress Made and Goals Achieved: Yes    [place], [place] 11/20/2020, 3:01 PM  Sonia Baller, MA, CCC-SLP Speech Therapy
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
## 2020-11-20 NOTE — Progress Notes (Signed)
Patient Details  Name: Jason Alexander MRN: [id number] Date of Birth: 13-Apr-1976  Today's Date: 11/20/2020  Hospital Problems: Principal Problem:   Displaced comminuted fracture of shaft of left femur, initial encounter for closed fracture [facility]) Active Problems:   Generalized anxiety disorder   Depression   Current every day smoker   Weight gain, abnormal   Other male erectile dysfunction   Hypogonadism in male   Closed displaced comminuted fracture of shaft of left humerus   Closed displaced comminuted fracture of shaft of left radius   Closed displaced comminuted fracture of shaft of left ulna   Vitamin D insufficiency   Left radial nerve palsy   Critical polytrauma  Past Medical History:  Past Medical History:  Diagnosis Date  . Closed displaced comminuted fracture of shaft of left humerus 10/27/2020  . Closed displaced comminuted fracture of shaft of left radius 10/29/2020  . Closed displaced comminuted fracture of shaft of left ulna 10/29/2020  . Depression 2006   after divorce  . Displaced comminuted fracture of shaft of left femur, initial encounter for closed fracture ([place]) 10/29/2020  . 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 Vitamin D insufficiency 10/29/2020   Past Surgical History:  Past Surgical History:  Procedure Laterality Date  . APPENDECTOMY    . FEMUR IM NAIL Left 10/28/2020   Procedure: INTRAMEDULLARY (IM) RETROGRADE FEMORAL NAILING;  Surgeon: Altamese Bensley, MD;  Location: [place];  Service: Orthopedics;  Laterality: Left;  . NASAL SINUS SURGERY     polyp removal  . ORIF HUMERUS FRACTURE Left 10/28/2020   Procedure: OPEN REDUCTION INTERNAL FIXATION (ORIF) HUMERAL SHAFT FRACTURE;  Surgeon: Altamese [place]burgh, MD;  Location: [place];  Service: Orthopedics;  Laterality: Left;  . ORIF RADIAL FRACTURE Left 10/28/2020   Procedure: OPEN REDUCTION INTERNAL FIXATION (ORIF) RADIAL FRACTURE;  Surgeon: Altamese Ridgeway, MD;  Location: [place];  Service: Orthopedics;   Laterality: Left;   Social History:  reports that he has been smoking cigarettes. He has a 20.00 pack-year smoking history. He has never used smokeless tobacco. He reports current drug use. Drug: Marijuana. He reports that he does not drink alcohol.  Family / Support Systems Marital Status: Married How Long?: Pt has been married for 15 years, and currently going through a divorce. Verbal seperation for one month Patient Roles: Parent Spouse/Significant Other: Currently going through a divorce Children: 3 children: 12/10/6 Other Supports: Sister Anticipated Caregiver: Sister IT sales professional Ability/Limitations of Caregiver: Reeves Forth only able to provide intermittent support as she cares for 42 y.o. autistic grandson and 68 y.o. 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 Recent Psychosocial Issues: Pt states he is having a hard time dealing with the break up from his wife Psychiatric History: Pt admits to depression/anxiety Substance Abuse History: Pt admits to [place] use daily, quit smoking cigarettes on date of admission to hospital. Had been smoking 1/2 ppd for 20 years.  Patient / Family Perceptions, Expectations &  Goals Pt/Family understanding of illness & functional limitations: Pt has  general understanding of care needs at discharge. Premorbid pt/family roles/activities: Independent Anticipated changes in roles/activities/participation: Assistance with ADLs/IADLs  Community Resources Express Scripts: None Premorbid Home Care/DME Agencies: None Transportation available at discharge: sister Musician referrals recommended: Neuropsychology  Discharge Planning Living Arrangements: Other relatives Support Systems: Other relatives Type of Residence: Private residence Insurance Resources: Multimedia programmer (specify) ([place]) Financial Resources: Family Support Financial Screen Referred: No Living Expenses: Medical laboratory scientific officer Management: Patient Does the patient have any problems obtaining your medications?: No Care Coordinator Barriers to Discharge: Decreased caregiver support,Lack of/limited family support Care Coordinator Anticipated Follow Up Needs: HH/OP Expected length of stay: 3-6 days  Clinical Impression SW met with pt in room to introduce self, explain role, and discuss discharge process. Pt is not a English as a second language teacher. No HCPOA. No DME.  Pinchos Topel A Nayson Traweek 11/20/2020, 4:22 PM

## 2020-11-21 ENCOUNTER — Other Ambulatory Visit (HOSPITAL_COMMUNITY): Payer: Self-pay | Admitting: Physician Assistant

## 2020-11-21 ENCOUNTER — Inpatient Hospital Stay (HOSPITAL_COMMUNITY): Payer: Medicaid Other | Admitting: Occupational Therapy

## 2020-11-21 ENCOUNTER — Inpatient Hospital Stay (HOSPITAL_COMMUNITY): Payer: Medicaid Other

## 2020-11-21 ENCOUNTER — Encounter (HOSPITAL_COMMUNITY): Payer: Medicaid Other | Admitting: Psychology

## 2020-11-21 ENCOUNTER — Inpatient Hospital Stay (HOSPITAL_COMMUNITY): Payer: Medicaid Other | Admitting: Speech Pathology

## 2020-11-21 DIAGNOSIS — G5632 Lesion of radial nerve, left upper limb: Secondary | ICD-10-CM | POA: Diagnosis not present

## 2020-11-21 DIAGNOSIS — S52352S Displaced comminuted fracture of shaft of radius, left arm, sequela: Secondary | ICD-10-CM | POA: Diagnosis not present

## 2020-11-21 DIAGNOSIS — F322 Major depressive disorder, single episode, severe without psychotic features: Secondary | ICD-10-CM | POA: Diagnosis not present

## 2020-11-21 DIAGNOSIS — F411 Generalized anxiety disorder: Secondary | ICD-10-CM | POA: Diagnosis not present

## 2020-11-21 DIAGNOSIS — S72352A Displaced comminuted fracture of shaft of left femur, initial encounter for closed fracture: Secondary | ICD-10-CM | POA: Diagnosis not present

## 2020-11-21 DIAGNOSIS — S42352S Displaced comminuted fracture of shaft of humerus, left arm, sequela: Secondary | ICD-10-CM | POA: Diagnosis not present

## 2020-11-21 MED ORDER — OXYCODONE HCL 5 MG PO TABS: 5.0000 mg | ORAL_TABLET | ORAL | 0 refills | Status: DC | PRN

## 2020-11-21 MED FILL — hydrOXYzine HCL 25 MG TABS: 25 | 15 days supply | Qty: 30 | Fill #0

## 2020-11-21 MED FILL — VITAMIN D3 25 MCG TABS: 25 | 4 days supply | Qty: 60 | Fill #0

## 2020-11-21 MED FILL — QUETIAPINE FUMARATE 50 MG T: 50 | 30 days supply | Qty: 30 | Fill #0

## 2020-11-21 MED FILL — CYCLOBENZAPRINE HCL 5 MG TA: 5 | 30 days supply | Qty: 90 | Fill #0

## 2020-11-21 MED FILL — PREGABALIN 100 MG CAPS: 100 | 30 days supply | Qty: 120 | Fill #0

## 2020-11-21 MED FILL — VITAMIN C 500 MG TABLET: 500 | 30 days supply | Qty: 60 | Fill #0

## 2020-11-21 MED FILL — traMADol HCL 50 MG TABS: 50 | 7 days supply | Qty: 28 | Fill #0

## 2020-11-21 MED FILL — DULoxetine HCL 30 MG CPEP: 30 | 30 days supply | Qty: 60 | Fill #0

## 2020-11-21 MED FILL — oxyCODONE HCL 5 MG TABS: 5 | 5 days supply | Qty: 30 | Fill #0

## 2020-11-21 NOTE — Progress Notes (Signed)
Occupational Therapy Discharge Summary  Patient Details  Name: Jason Alexander MRN: [id number] Date of Birth: 05-02-1976  Today's Date: 11/21/2020 OT Individual Time: 7035-0093 OT Individual Time Calculation (min): 58 min   Patient greeted semi-reclined in bed and agreeable to OT treatment session. Pt ambulated to bathroom with quad cane and supervision. He stood to urinate without LOB. Standing grooming tasks completed using one-handed strategies as needed 2/2 limited use of L UE. Pt propelled wc to dayroom using B LEs and R arm. OT reviewed with pt NO active L shoulder abduction. Focus on functional use of L hand with theraputty activity. Worked on wrist flex/ext and functional pinch. OT placed kinesiotape to L hand and forearm for edema management and sensory loss. Pt returned to room and pivoted back to bed with supervision. Pt left semi-reclined in bed with needs met and bed alarm on.   Patient has met 8 of 8 long term goals due to improved activity tolerance, improved balance, postural control, ability to compensate for deficits, functional use of  LEFT upper extremity, improved attention, improved awareness and improved coordination.  Patient to discharge at overall Supervision level.  Patient's care partner is independent to provide the necessary physical and cognitive assistance at discharge.    Reasons goals not met: n/a  Recommendation:  Patient will benefit from ongoing skilled OT services in outpatient setting to continue to advance functional skills in the area of BADL, Reduce care partner burden and functional use of L UE.  Equipment: tub transfer bench, quad cane, Left platform RW  Reasons for discharge: treatment goals met and discharge from hospital  Patient/family agrees with progress made and goals achieved: Yes  OT Discharge Precautions/Restrictions  Precautions Precautions: Fall Precaution Comments: weight bearing through L elbow only in platform RW; LUE ROM as  tolerated -  No active shoulder abduction Other Brace: L wrist cock-up splint; L radial n palsy splint for functional use/exercise, LUE sling for comfort Restrictions Weight Bearing Restrictions: Yes LUE Weight Bearing: Non weight bearing LLE Weight Bearing: Weight bearing as tolerated Other Position/Activity Restrictions: Per ortho MD note -- LLE ROM as tolerated; LUE "aggressive PROM L hand/wrist/elbow, gentle forearm supination/pronation" and "LUE ROM as tolerated except no active shoulder abduction" Pain Pain Assessment Pain Scale: 0-10 Pain Score: 7  Pain Type: Acute pain Pain Location: Arm Pain Orientation: Left Pain Descriptors / Indicators: Aching Pain Onset: On-going Pain Intervention(s): Repositioned Multiple Pain Sites: No ADL ADL Eating: Independent Where Assessed-Eating: Edge of bed Grooming: Modified independent Where Assessed-Grooming: Sitting at sink Upper Body Bathing: Supervision/safety Where Assessed-Upper Body Bathing: Shower Lower Body Bathing: Supervision/safety Where Assessed-Lower Body Bathing: Shower Upper Body Dressing: Supervision/safety Where Assessed-Upper Body Dressing: Sitting at sink Lower Body Dressing: Supervision/safety Where Assessed-Lower Body Dressing: Standing at sink,Sitting at sink Toileting: Supervision/safety Where Assessed-Toileting: Glass blower/designer: Close supervision Toilet Transfer Method: Human resources officer: Close supervison Clinical cytogeneticist Method: Magazine features editor: Minimal Designer, jewellery Method: Ambulating erception  Perception: Within Functional Limits Praxis Praxis: Intact Cognition Overall Cognitive Status: Impaired/Different from baseline Arousal/Alertness: Awake/alert Attention: Selective Focused Attention: Appears intact Sustained Attention: Impaired Sustained Attention Impairment: Functional complex;Verbal complex Selective Attention:  Impaired Selective Attention Impairment: Functional complex;Verbal complex Memory: Impaired Memory Impairment: Decreased recall of new information;Retrieval deficit;Storage deficit Decreased Short Term Memory: Verbal basic;Verbal complex;Functional basic Awareness: Appears intact Problem Solving: Appears intact Behaviors: Impulsive;Restless Safety/Judgment: Appears intact [employer] Scales of Cognitive Functioning: Purposeful/appropriate Sensation Sensation Light Touch Impaired Details: Impaired RUE (impaired radial  nerve distribution) Coordination Gross Motor Movements are Fluid and Coordinated: No Fine Motor Movements are Fluid and Coordinated: No Coordination and Movement Description: impaired 2/2 fractures, pain, and radial nerve palsy Motor  Motor Motor: Abnormal postural alignment and control Motor - Skilled Clinical Observations: limited by pain, global weakness, radial nerve palsy Mobility  Bed Mobility Supine to Sit: Supervision/Verbal cueing Sit to Supine: Supervision/Verbal cueing Transfers Sit to Stand: Supervision/Verbal cueing  Balance Static Sitting Balance Static Sitting - Balance Support: No upper extremity supported;Feet supported Static Sitting - Level of Assistance: 7: Independent Dynamic Sitting Balance Dynamic Sitting - Balance Support: No upper extremity supported;Feet supported;During functional activity Dynamic Sitting - Level of Assistance: 6: Modified independent (Device/Increase time) Static Standing Balance Static Standing - Balance Support: During functional activity Static Standing - Level of Assistance: 5: Stand by assistance Dynamic Standing Balance Dynamic Standing - Balance Support: During functional activity Dynamic Standing - Level of Assistance: 5: Stand by assistance Extremity/Trunk Assessment RUE Assessment RUE Assessment: Within Functional Limits LUE Assessment LUE Assessment: Exceptions to [facility] General Strength Comments:  Radial nerve palsy, can actively flex/ext fingres, no wrist extension   Daneen Schick Wilian Kwong 11/21/2020, 3:18 PM

## 2020-11-21 NOTE — Progress Notes (Signed)
RN notified Dan PA In regards to pt still experiencing burning sensation and a small knot in back of arm despite the increase in lyrica. RN elevated arm and gave prn pain medication. Pt states," I have a knot on the back of my left arm and its burning and numb." RN assessed +2 pulse, warm to the touch with color appropriate for ethnicity, and movement of the arm and fingers of the left arm. RN will continue to monitor.

## 2020-11-21 NOTE — Progress Notes (Signed)
Patient ID: Jason Alexander, male   DOB: May 14, 1976, 44 y.o.   MRN: [id number]  SW spoke with Renae/TTS counselor at [phone] to inquiure about bed availability. Informed currently no bed availability. Medical team amenable to pt to d/c to home with outpatient behavioral health services.   SW spoke with [facility]/ [phone]) to schedule new pt appointment. Pt scheduled for Wednesday, January 5 at 11am with [name].  SW returned phone call to pt sister Jason Alexander [phone]) to inform on above. She intends to pick up pt tomorrow due to many obligations and unable to pick up pt today. Medications sent to [facility] pharmacy.   Cecile Sheerer, MSW, LCSWA Office: [phone] Cell: [phone] Fax: [phone]

## 2020-11-21 NOTE — Discharge Instructions (Signed)
Inpatient Rehab Discharge Instructions  Jason Alexander Discharge date and time: No discharge date for patient encounter.   Activities/Precautions/ Functional Status: Activity: Weightbearing as tolerated through left elbow with platform rolling walker Diet: regular diet Wound Care: Routine skin checks Functional status:  ___ No restrictions     ___ Walk up steps independently ___ 24/7 supervision/assistance   ___ Walk up steps with assistance ___ Intermittent supervision/assistance  ___ Bathe/dress independently ___ Walk with walker     _x__ Bathe/dress with assistance ___ Walk Independently    ___ Shower independently ___ Walk with assistance    ___ Shower with assistance ___ No alcohol     ___ Return to work/school ________  COMMUNITY REFERRALS UPON DISCHARGE:    Outpatient: PT     OT    ST                 Agency: [facility]  Phone: [phone]             Appointment Date/Time:*Please expect follow-up within 7-10 business days to schedule your appointment. If you have not received follow-up be sure to contact the site directly. Keep in mind holiday in which there could be a delay in follow-up.*  Medical Equipment/Items Ordered: quad cane, tub transfer bench, Rolling walker with left platform attachment (to be picked up in retail store)                                                 Agency/Supplier: [employer] [phone]   GENERAL COMMUNITY RESOURCES FOR PATIENT/FAMILY: New patient appointment at [name] [phone]) on Wednesday, December 10, 2020 at 11am with [name]. This will be a virtual appointment, and the link for the appointment will be sent to the contact number on file.  If unable to make this appointment, please be sure to call and reschedule.    Special Instructions: No driving smoking or alcohol   My questions have been answered and I understand these instructions. I will adhere to these goals and the provided  educational materials after my discharge from the hospital.  Patient/Caregiver Signature _______________________________ Date __________  Clinician Signature _______________________________________ Date __________  Please bring this form and your medication list with you to all your follow-up doctor's appointments.
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
## 2020-11-21 NOTE — Plan of Care (Signed)
Problem: RH Balance Goal: LTG Patient will maintain dynamic standing with ADLs (OT) Description: LTG:  Patient will maintain dynamic standing balance with assist during activities of daily living (OT)  Outcome: Completed/Met   Problem: RH Dressing Goal: LTG Patient will perform upper body dressing (OT) Description: LTG Patient will perform upper body dressing with assist, with/without cues (OT). Outcome: Completed/Met Goal: LTG Patient will perform lower body dressing w/assist (OT) Description: LTG: Patient will perform lower body dressing with assist, with/without cues in positioning using equipment (OT) Outcome: Completed/Met   Problem: RH Toileting Goal: LTG Patient will perform toileting task (3/3 steps) with assistance level (OT) Description: LTG: Patient will perform toileting task (3/3 steps) with assistance level (OT)  Outcome: Completed/Met   Problem: RH Functional Use of Upper Extremity Goal: LTG Patient will use RT/LT upper extremity as a (OT) Description: LTG: Patient will use right/left upper extremity as a stabilizer/gross assist/diminished/nondominant/dominant level with assist, with/without cues during functional activity (OT) Outcome: Completed/Met   Problem: RH Toilet Transfers Goal: LTG Patient will perform toilet transfers w/assist (OT) Description: LTG: Patient will perform toilet transfers with assist, with/without cues using equipment (OT) Outcome: Completed/Met   Problem: RH Tub/Shower Transfers Goal: LTG Patient will perform tub/shower transfers w/assist (OT) Description: LTG: Patient will perform tub/shower transfers with assist, with/without cues using equipment (OT) Outcome: Completed/Met   Problem: RH Awareness Goal: LTG: Patient will demonstrate awareness during functional activites type of (OT) Description: LTG: Patient will demonstrate awareness during functional activites type of (OT) Outcome: Completed/Met

## 2020-11-21 NOTE — Progress Notes (Signed)
Physical Therapy Discharge Summary  Patient Details  Name: Jason Alexander MRN: [id number] Date of Birth: 1976-08-21  Today's Date: 11/21/2020 PT Individual Time: 1002-1059 PT Individual Time Calculation (min): 57 min    Patient has met 9 of 9 long term goals due to improved activity tolerance, improved balance, increased strength and decreased pain.  Patient to discharge at an ambulatory level Supervision.   Patient's care partner is independent to provide the necessary physical assistance at discharge.  Reasons goals not met: NA  Recommendation:  Patient will benefit from ongoing skilled PT services in outpatient setting to continue to advance safe functional mobility, address ongoing impairments in strength, balance, ambulation, and minimize fall risk.  Equipment: quad cane, platform RW  Reasons for discharge: treatment goals met and discharge from hospital  Patient/family agrees with progress made and goals achieved: Yes   Skilled Therapeutic Interventions Pt received sitting in recliner and agrees to therapy. Sit to stand independently and stand step transfer to [facility] with quad cane. PT adjusts cane height to fit pt. WC transport to gym for time management. Pt performs car transfers and ramp navigation with platform RW and cues for sequencing and body mechanics. Pt then ambulates 300' with platform RW and verbal cues for increased L hip abductor activation during stance phase and decreased WB through RW for energy conservation. Seated rest break prior to performs 12 6" steps with R hand rail and cues on sequencing for safety. Pt educated on safe mobility upon discharge and recommendations for frequency of performing ambulation. Pt performs stand step transfer back to recliner with supervision. Left seated in recliner with all needs within reach.  PT Discharge Precautions/Restrictions Precautions Precautions: Fall Precaution Comments: weight bearing through L elbow only in platform RW;  LUE ROM as tolerated -  No active shoulder abduction Other Brace: L wrist cock-up splint; L radial n palsy splint for functional use/exercise, LUE sling for comfort Restrictions Weight Bearing Restrictions: Yes LUE Weight Bearing: Non weight bearing LLE Weight Bearing: Weight bearing as tolerated Other Position/Activity Restrictions: Per ortho MD note -- LLE ROM as tolerated; LUE "aggressive PROM L hand/wrist/elbow, gentle forearm supination/pronation" and "LUE ROM as tolerated except no active shoulder abduction" Pain Pain Assessment Pain Scale: 0-10 Pain Score: 7  Pain Type: Acute pain Pain Location: Arm Pain Orientation: Left Pain Descriptors / Indicators: Aching Pain Onset: On-going Pain Intervention(s): Medication (See eMAR) Multiple Pain Sites: No Vision/Perception  Perception Perception: Within Functional Limits Praxis Praxis: Intact  Cognition Overall Cognitive Status: Impaired/Different from baseline Arousal/Alertness: Awake/alert Attention: Selective Focused Attention: Appears intact Sustained Attention: Impaired Sustained Attention Impairment: Functional complex;Verbal complex Selective Attention: Impaired Selective Attention Impairment: Functional complex;Verbal complex Memory: Impaired Memory Impairment: Decreased recall of new information;Retrieval deficit;Storage deficit Decreased Short Term Memory: Verbal basic;Verbal complex;Functional basic Awareness: Appears intact Problem Solving: Appears intact Behaviors: Impulsive;Restless Safety/Judgment: Appears intact [employer] Scales of Cognitive Functioning: Purposeful/appropriate Sensation Sensation Light Touch Impaired Details: Impaired RUE (impaired radial nerve distribution) Coordination Gross Motor Movements are Fluid and Coordinated: No Fine Motor Movements are Fluid and Coordinated: No Coordination and Movement Description: impaired 2/2 fractures, pain, and radial nerve palsy Motor   Motor Motor: Abnormal postural alignment and control Motor - Skilled Clinical Observations: limited by pain, global weakness, radial nerve palsy  Mobility Bed Mobility Supine to Sit: Supervision/Verbal cueing Sit to Supine: Supervision/Verbal cueing Transfers Sit to Stand: Supervision/Verbal cueing Stand Pivot Transfers: Supervision/Verbal cueing Locomotion  Gait Ambulation: Yes Gait Assistance: Supervision/Verbal cueing Gait Distance (Feet): 300 Feet  Assistive device: Left platform walker Gait Assistance Details: Verbal cues for gait pattern;Verbal cues for technique Gait Gait: Yes Gait Pattern: Impaired Gait Pattern: Decreased step length - left Gait velocity: Decreased Stairs / Additional Locomotion Stairs: Yes Stairs Assistance: Supervision/Verbal cueing Stair Management Technique: One rail Right Number of Stairs: 12 Height of Stairs: 6 Ramp: Supervision/Verbal cueing Curb: Supervision/Verbal cueing Wheelchair Mobility Wheelchair Mobility: No  Trunk/Postural Assessment  Cervical Assessment Cervical Assessment: Exceptions to [facility] (forward head) Thoracic Assessment Thoracic Assessment: Exceptions to [facility] (rounded shoulders) Lumbar Assessment Lumbar Assessment: Exceptions to [facility] (posterior pelvic tilt) Postural Control Postural Control: Within Functional Limits  Balance Static Sitting Balance Static Sitting - Balance Support: No upper extremity supported;Feet supported Static Sitting - Level of Assistance: 7: Independent Dynamic Sitting Balance Dynamic Sitting - Balance Support: No upper extremity supported;Feet supported;During functional activity Dynamic Sitting - Level of Assistance: 6: Modified independent (Device/Increase time) Static Standing Balance Static Standing - Balance Support: During functional activity Static Standing - Level of Assistance: 5: Stand by assistance Dynamic Standing Balance Dynamic Standing - Balance Support: During functional  activity Dynamic Standing - Level of Assistance: 5: Stand by assistance Extremity Assessment  RUE Assessment RUE Assessment: Within Functional Limits LUE Assessment LUE Assessment: Exceptions to [facility] General Strength Comments: Radial nerve palsy, can actively flex/ext fingres, no wrist extension RLE Assessment RLE Assessment: Within Functional Limits General Strength Comments: 5/5 grossly LLE Assessment LLE Assessment: Exceptions to [facility] Passive Range of Motion (PROM) Comments: decreased hip and knee ROM 2/2 pain and swelling General Strength Comments: Grossly at least 3+/5. Formal testing deferred 2/2 pain and WBing restrictions    Breck Coons, PT, DPT 11/21/2020, 3:38 PM

## 2020-11-22 NOTE — Progress Notes (Signed)
Patient discharged from unit at 1000 with sister. Patient transition to care meds picked up from pharmacy. Patient left with equipment, tub bench and cane.

## 2020-11-22 NOTE — Plan of Care (Signed)
  Problem: Consults Goal: RH GENERAL PATIENT EDUCATION Description: See Patient Education module for education specifics. Outcome: Completed/Met   Problem: RH SKIN INTEGRITY Goal: RH STG SKIN FREE OF INFECTION/BREAKDOWN Description: No skin breakdown this admission Outcome: Completed/Met Goal: RH STG MAINTAIN SKIN INTEGRITY WITH ASSISTANCE Description: STG Maintain Skin Integrity With min Assistance. Outcome: Completed/Met   Problem: RH SAFETY Goal: RH STG ADHERE TO SAFETY PRECAUTIONS W/ASSISTANCE/DEVICE Description: STG Adhere to Safety Precautions With min Assistance/Device. Outcome: Completed/Met   Problem: RH PAIN MANAGEMENT Goal: RH STG PAIN MANAGED AT OR BELOW PT'S PAIN GOAL Description: Less than 3 out of 10 Outcome: Completed/Met   Problem: RH KNOWLEDGE DEFICIT GENERAL Goal: RH STG INCREASE KNOWLEDGE OF SELF CARE AFTER HOSPITALIZATION Description: Patient will be able to demonstrate medication management, weight bearing precautions, and pain management with educational materials and handouts and min assist from CIR staff. Outcome: Completed/Met

## 2020-11-24 ENCOUNTER — Telehealth: Payer: Self-pay

## 2020-11-24 NOTE — Telephone Encounter (Signed)
SW spoke with Jason Alexander in referrals to discuss appointment, as not listed in system. Reports appt shows up on their end for Wednesday, January 5 at 11am with [name]. No further SW intervention needed.

## 2020-11-25 NOTE — Progress Notes (Signed)
Inpatient Rehabilitation Care Coordinator  Discharge Note  The overall goal for the admission was met for:   Discharge location: Yes. D.c to his sister's home who will provide intermittent support.   Length of Stay: Yes. 6 days.   Discharge activity level: Yes. Supervision  Home/community participation: Yes. Limited.   Services provided included: MD, RD, PT, OT, SLP, RN, CM, TR, Pharmacy, Neuropsych and SW  Financial Services: Private Insurance: [facility] Medicaid  Follow-up services arranged: Outpatient: [facility] for outpatient PT/OT/SLP, DME: [place] for quad cane, TTB, and RW with left platform attachment (item to be picked up at retail store) and Other: New patient appointment at [facility] [phone]) on Wednesday, December 10, 2020 at 11am with Maye Hides.   Comments (or additional information): contact pt sister [facility] [phone]  Patient/Family verbalized understanding of follow-up arrangements: Yes  Individual responsible for coordination of the follow-up plan: Pt to have assistance with coordinating care needs.   Confirmed correct DME delivered: Rana Snare 11/25/2020    Rana Snare

## 2020-12-02 ENCOUNTER — Encounter (HOSPITAL_COMMUNITY): Payer: Self-pay

## 2020-12-02 ENCOUNTER — Ambulatory Visit (HOSPITAL_COMMUNITY): Payer: Medicaid Other | Admitting: Occupational Therapy

## 2020-12-02 ENCOUNTER — Encounter (HOSPITAL_COMMUNITY): Payer: Self-pay | Admitting: Occupational Therapy

## 2020-12-02 ENCOUNTER — Ambulatory Visit (HOSPITAL_COMMUNITY): Payer: Medicaid Other | Admitting: Speech Pathology

## 2020-12-02 ENCOUNTER — Encounter (HOSPITAL_COMMUNITY): Payer: Self-pay | Admitting: Speech Pathology

## 2020-12-02 ENCOUNTER — Ambulatory Visit (HOSPITAL_COMMUNITY): Payer: Medicaid Other | Attending: Physician Assistant

## 2020-12-02 ENCOUNTER — Other Ambulatory Visit: Payer: Self-pay

## 2020-12-02 DIAGNOSIS — M25512 Pain in left shoulder: Secondary | ICD-10-CM

## 2020-12-02 DIAGNOSIS — M79602 Pain in left arm: Secondary | ICD-10-CM | POA: Insufficient documentation

## 2020-12-02 DIAGNOSIS — M6281 Muscle weakness (generalized): Secondary | ICD-10-CM | POA: Insufficient documentation

## 2020-12-02 DIAGNOSIS — M25612 Stiffness of left shoulder, not elsewhere classified: Secondary | ICD-10-CM | POA: Diagnosis not present

## 2020-12-02 DIAGNOSIS — R29818 Other symptoms and signs involving the nervous system: Secondary | ICD-10-CM | POA: Insufficient documentation

## 2020-12-02 DIAGNOSIS — R41841 Cognitive communication deficit: Secondary | ICD-10-CM

## 2020-12-02 DIAGNOSIS — R2681 Unsteadiness on feet: Secondary | ICD-10-CM | POA: Diagnosis not present

## 2020-12-02 DIAGNOSIS — R278 Other lack of coordination: Secondary | ICD-10-CM | POA: Insufficient documentation

## 2020-12-02 DIAGNOSIS — R29898 Other symptoms and signs involving the musculoskeletal system: Secondary | ICD-10-CM

## 2020-12-02 DIAGNOSIS — R262 Difficulty in walking, not elsewhere classified: Secondary | ICD-10-CM | POA: Insufficient documentation
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
## 2020-12-02 NOTE — Patient Instructions (Signed)
Access Code: [email or web address] URL: [email or web address] Date: 12/02/2020 Prepared by: Shary Decamp  Exercises Supine Quad Set - 1 x daily - 7 x weekly - 3 sets - 10 reps - 3 sec hold Supine Heel Slide with Strap - 1 x daily - 7 x weekly - 3 sets - 10 reps - 3 sec hold Supine Hip Adduction Isometric with Ball - 1 x daily - 7 x weekly - 3 sets - 10 reps - 3 sec hold Seated Hip Adduction Isometrics with Ball - 1 x daily - 7 x weekly - 3 sets - 10 reps - 3 sec hold
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
## 2020-12-06 DIAGNOSIS — Z419 Encounter for procedure for purposes other than remedying health state, unspecified: Secondary | ICD-10-CM | POA: Diagnosis not present

## 2020-12-09 ENCOUNTER — Ambulatory Visit (HOSPITAL_COMMUNITY): Payer: Medicaid Other | Attending: Physician Assistant | Admitting: Occupational Therapy

## 2020-12-09 ENCOUNTER — Other Ambulatory Visit: Payer: Self-pay

## 2020-12-09 ENCOUNTER — Encounter (HOSPITAL_COMMUNITY): Payer: Self-pay

## 2020-12-09 ENCOUNTER — Ambulatory Visit (HOSPITAL_COMMUNITY): Payer: Medicaid Other

## 2020-12-09 ENCOUNTER — Encounter (HOSPITAL_COMMUNITY): Payer: Self-pay | Admitting: Occupational Therapy

## 2020-12-09 DIAGNOSIS — S72352A Displaced comminuted fracture of shaft of left femur, initial encounter for closed fracture: Secondary | ICD-10-CM | POA: Insufficient documentation

## 2020-12-09 DIAGNOSIS — R29818 Other symptoms and signs involving the nervous system: Secondary | ICD-10-CM | POA: Diagnosis not present

## 2020-12-09 DIAGNOSIS — R2681 Unsteadiness on feet: Secondary | ICD-10-CM

## 2020-12-09 DIAGNOSIS — R262 Difficulty in walking, not elsewhere classified: Secondary | ICD-10-CM | POA: Insufficient documentation

## 2020-12-09 DIAGNOSIS — M79602 Pain in left arm: Secondary | ICD-10-CM | POA: Insufficient documentation

## 2020-12-09 DIAGNOSIS — M25512 Pain in left shoulder: Secondary | ICD-10-CM | POA: Insufficient documentation

## 2020-12-09 DIAGNOSIS — R278 Other lack of coordination: Secondary | ICD-10-CM | POA: Insufficient documentation

## 2020-12-09 DIAGNOSIS — R29898 Other symptoms and signs involving the musculoskeletal system: Secondary | ICD-10-CM | POA: Diagnosis not present

## 2020-12-09 DIAGNOSIS — M25612 Stiffness of left shoulder, not elsewhere classified: Secondary | ICD-10-CM | POA: Diagnosis not present

## 2020-12-09 DIAGNOSIS — M6281 Muscle weakness (generalized): Secondary | ICD-10-CM

## 2020-12-09 NOTE — Therapy (Signed)
[facility] [address] [place], [place], [address] Phone: [phone]   Fax:  [phone]  Occupational Therapy Treatment  Patient Details  Name: Jason Alexander MRN: [id number] Date of Birth: 02/11/1976 Referring Provider (OT): Mariam Dollar, PA-C   Encounter Date: 12/09/2020   OT End of Session - 12/09/20 1120    Visit Number 2    Number of Visits 12    Date for OT Re-Evaluation 01/13/21    Authorization Type [facility] Medicaid    Authorization Time Period 27 visit limit combined PT/OT/SP; 3 visits approved from 12/04/20-01/04/21 (can request more when the first 3 visits are completed regardless of date)    Authorization - Visit Number 1    Authorization - Number of Visits 3    OT Start Time 0945    OT Stop Time 1108    OT Time Calculation (min) 83 min    Activity Tolerance Patient tolerated treatment well    Behavior During Therapy [facility] for tasks assessed/performed           Past Medical History:  Diagnosis Date  . Closed displaced comminuted fracture of shaft of left humerus 10/27/2020  . Closed displaced comminuted fracture of shaft of left radius 10/29/2020  . Closed displaced comminuted fracture of shaft of left ulna 10/29/2020  . Depression 2006   after divorce  . Displaced comminuted fracture of shaft of left femur, initial encounter for closed fracture (HCC) 10/29/2020  . 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 Vitamin D insufficiency 10/29/2020    Past Surgical History:  Procedure Laterality Date  . APPENDECTOMY    . FEMUR IM NAIL Left 10/28/2020   Procedure: INTRAMEDULLARY (IM) RETROGRADE FEMORAL NAILING;  Surgeon: Myrene Galas, MD;  Location: [facility] OR;  Service: Orthopedics;  Laterality: Left;  . NASAL SINUS SURGERY     polyp removal  . ORIF HUMERUS FRACTURE Left 10/28/2020   Procedure: OPEN REDUCTION INTERNAL FIXATION (ORIF) HUMERAL SHAFT FRACTURE;  Surgeon: Myrene Galas, MD;  Location: [facility] OR;  Service:  Orthopedics;  Laterality: Left;  . ORIF RADIAL FRACTURE Left 10/28/2020   Procedure: OPEN REDUCTION INTERNAL FIXATION (ORIF) RADIAL FRACTURE;  Surgeon: Myrene Galas, MD;  Location: [facility] OR;  Service: Orthopedics;  Laterality: Left;    There were no vitals filed for this visit.   Subjective Assessment - 12/09/20 0943    Subjective  S: I had a weird pain yesterday that went up my arm when I was trying to trim my nails.    Currently in Pain? Yes    Pain Score 2     Pain Location Shoulder    Pain Orientation Left    Pain Descriptors / Indicators Sore    Pain Type Acute pain    Pain Radiating Towards None    Pain Onset 1 to 4 weeks ago    Pain Frequency Intermittent    Aggravating Factors  doing things he's not supposed to be doing    Pain Relieving Factors rest    Effect of Pain on Daily Activities mod difficulty with ADLs    Multiple Pain Sites No              [facility] OT Assessment - 12/09/20 0941      Assessment   Medical Diagnosis s/p left humerus ORIF, left ulna and radius ORIF, radius grafting, exploration of radial nerve      Precautions   Precautions Fall;Shoulder;Other (comment)    Type of Shoulder Precautions P/ROM, A/ROM; NO active  abduction    Precaution Comments Waiting to hear back from PA regarding updated precautions      Restrictions   Weight Bearing Restrictions Yes    LUE Weight Bearing Weight bearing as tolerated   through elbow   LLE Weight Bearing Weight bearing as tolerated    Other Position/Activity Restrictions useing platform RW      Sensation   Semmes Weinstein Monofilament Scale Diminished Protective   along radial nerve distribution on dorsal hand; LUE is normal to dimished light touch     Coordination   9 Hole Peg Test Right;Left    Right 9 Hole Peg Test 28.69"    Left 9 Hole Peg Test 1'35"                    OT Treatments/Exercises (OP) - 12/09/20 1004      ADLs   ADL Comments Assessed sensation as well as fine motor  coordination today. Educated pt on sensory reintegration and nerve repair.      Exercises   Exercises Shoulder;Elbow;Wrist      Shoulder Exercises: Supine   Protraction PROM;10 reps    Horizontal ABduction PROM;10 reps    External Rotation PROM;10 reps    Internal Rotation PROM;10 reps    Flexion PROM;10 reps    ABduction PROM;10 reps      Shoulder Exercises: Seated   Elevation AROM;10 reps    Extension AROM;10 reps    Row AROM;10 reps   hands clasped to maintain elbow flexion   Other Seated Exercises scapular depression, 10X      Shoulder Exercises: Therapy Ball   Flexion 10 reps      Shoulder Exercises: Isometric Strengthening   Flexion Supine;5X5"    Extension Supine;5X5"    External Rotation Supine;5X5"    Internal Rotation Supine;5X5"    ABduction Supine;5X5"    ADduction Supine;5X5"      Elbow Exercises   Elbow Extension PROM;AROM;10 reps    Forearm Supination PROM;AROM;10 reps    Forearm Pronation PROM;AROM;10 reps    Other elbow exercises elbow flexion, P/ROM 10X      Wrist Exercises   Wrist Flexion AROM;10 reps    Wrist Extension PROM;AAROM;10 reps    Wrist Radial Deviation PROM;Self ROM;10 reps    Wrist Ulnar Deviation PROM;Self ROM;10 reps      Neurological Re-education Exercises   Weight Bearing Position Seated    Seated with weight on forearm pt seated on mat table, weightbearing through left elbow, 2x, 1' holds      Manual Therapy   Manual Therapy Myofascial release;Edema management    Manual therapy comments completed separately from therapeutic exercises    Edema Management retrograde massage to volar and dorsal forearm to decrease edema and improve mobility    Myofascial Release myofascial release to left forearm, upper arm, trapezius, and scapula to decrease pain and fascial restrictions and improve joint ROM                  OT Education - 12/09/20 1059    Education Details scapular A/ROM, elbow, wrist and forearm self-ROM/A/ROM     Person(s) Educated Patient    Methods Explanation;Demonstration;Handout    Comprehension Verbalized understanding;Returned demonstration            OT Short Term Goals - 12/09/20 0943      OT SHORT TERM GOAL #1   Title Pt will be provided with and educated on HEP to improve LUE use as non-dominant during  ADL completion.    Time 3    Period Weeks    Status On-going    Target Date 12/23/20      OT SHORT TERM GOAL #2   Title Pt will increase LUE P/ROM to [facility] to improve mobility throughout LUE required to perform dressing and bathing tasks using LUE as assist.    Time 3    Period Weeks    Status On-going      OT SHORT TERM GOAL #3   Title Pt will decrease LUE fascial restrictions to moderate amount to improve mobility required for functional reaching tasks.    Time 3    Period Weeks    Status On-going      OT SHORT TERM GOAL #4   Title Pt will increase left elbow strength to 2/5 flexion and 3+/5 extension to improve ability to use LUE to assist during UB dressing tasks.    Time 3    Period Weeks    Status On-going      OT SHORT TERM GOAL #5   Title Pt will be provided with necessary splints and educated on wear and care of splints for LUE wrist stability and functional use.    Time 3    Period Weeks    Status On-going      OT SHORT TERM GOAL #6   Title Pt will be educated on and verbalize appropriate understanding and utilization of sensory reintegration strategies.    Time 3    Period Weeks    Status On-going             OT Long Term Goals - 12/09/20 0943      OT LONG TERM GOAL #1   Title Pt will decrease pain in LUE to 3/10 or less to improve ability to sleep for 3+ consecutive hours without waking due to pain.    Time 6    Period Weeks    Status On-going      OT LONG TERM GOAL #2   Title Pt will increase LUE A/ROM to [facility] to improve ability to use LUE as assist during eating and grooming tasks.    Time 6    Period Weeks    Status On-going      OT LONG  TERM GOAL #3   Title Pt will increase LUE shoulder strength to 4/5 or greater to improve ability to participate in exercise routine.    Time 6    Period Weeks    Status On-going      OT LONG TERM GOAL #4   Title Pt will increase LUE elbow strength to 4-/5 flexion and 4/5 extension to improve ability to bring washcloth to face.    Time 6    Period Weeks    Status On-going      OT LONG TERM GOAL #5   Title Pt will increase LUE wrist strength to 4/5 flexion and 3/5 extension to improve ability to turn items (keys, tops to jars, etc) during ADLs.    Time 6    Period Weeks    Status On-going      OT LONG TERM GOAL #6   Title Pt will increase LUE grip strength by 25# and pinch strength by 6# to improve ability to grasp and maintain hold on items during functional use.    Time 6    Period Weeks    Status On-going      OT LONG TERM GOAL #7   Title Pt will  increase fine motor coordination by completing 9 hole peg test in under 30" to improve ability to manipulate small items during ADLs.    Time 6    Period Weeks    Status On-going                 Plan - 12/09/20 1103    Clinical Impression Statement A: Pt brought all splints today, has static radial nerve splint however elastics are stretched and thumb elastic is broken. Sensation tested today, pt with sporadic normal to dimished light touch throughout LUE, fading to dimished protective along radial nerve distribution of dorsal forearm and hand. Initiated manual therapy throughout LUE addressing edema, fascial restrictions, and scar restrictions to improve mobility. Also initiated P/ROM of LUE, isometrics for shoulder. Pt completing A/ROM for wrist flexion, elbow extension, supination/pronation. Pt is unable to actively complete elbow flexion and wrist extension. HEP provided today and pt educated on completion. Also reminded pt of suggestions for desensitization to sensitive areas along LUE. Pt educated on thoroughly washing and  scrubbing arm as he has lots of loose and shedding skin today.    Body Structure / Function / Physical Skills ADL;Endurance;UE functional use;Fascial restriction;Flexibility;Pain;FMC;ROM;Coordination;Sensation;IADL;Strength;Mobility;Edema    Plan P: Follow up on HEP, Fabricate dynamic radial nerve palsy splint, attempt NMES for bicep activation required for elbow flexion    Consulted and Agree with Plan of Care Patient           Patient will benefit from skilled therapeutic intervention in order to improve the following deficits and impairments:   Body Structure / Function / Physical Skills: ADL,Endurance,UE functional use,Fascial restriction,Flexibility,Pain,FMC,ROM,Coordination,Sensation,IADL,Strength,Mobility,Edema       Visit Diagnosis: Acute pain of left shoulder  Pain in left arm  Stiffness of left shoulder, not elsewhere classified  Other symptoms and signs involving the musculoskeletal system  Other symptoms and signs involving the nervous system  Other lack of coordination    Problem List Patient Active Problem List   Diagnosis Date Noted  . Critical polytrauma 11/15/2020  . Closed displaced comminuted fracture of shaft of left radius 10/29/2020  . Closed displaced comminuted fracture of shaft of left ulna 10/29/2020  . Displaced comminuted fracture of shaft of left femur, initial encounter for closed fracture ([place]) 10/29/2020  . Vitamin D insufficiency 10/29/2020  . 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 Closed displaced comminuted fracture of shaft of left humerus 10/27/2020  . Hypogonadism in male 08/15/2019  . Other male erectile dysfunction 11/08/2016  . Depression 10/05/2016  . Current every day smoker 10/05/2016  . Weight gain, abnormal 10/05/2016  . Generalized anxiety disorder 03/01/2016   Guadelupe Sabin, OTR/L  [phone] 12/09/2020, 11:38 AM  [facility] [address] [place], [place], [address] Phone:  [phone]   Fax:  [phone]  Name: Jason Alexander MRN: [id number] Date of Birth: 21-Jul-1976

## 2020-12-09 NOTE — Therapy (Addendum)
[facility] [address] [place], [place], [address] Phone: [phone]   Fax:  [phone]  Physical Therapy Treatment  Patient Details  Name: Jason Alexander MRN: [id number] Date of Birth: 1976/02/28 Referring Provider (PT): Montez Morita   Encounter Date: 12/09/2020   PT End of Session - 12/09/20 1126    Visit Number 2    Number of Visits 12    Date for PT Re-Evaluation 01/13/21    Authorization Type Medicaid [employer], request 12 visits, check auth. 12 visits approved    Authorization Time Period 12/02/20-01/31/21    Authorization - Visit Number 2    Authorization - Number of Visits 12    Progress Note Due on Visit 10    PT Start Time 1115    PT Stop Time 1200    PT Time Calculation (min) 45 min    Equipment Utilized During Treatment Gait belt    Activity Tolerance Patient tolerated treatment well    Behavior During Therapy WFL for tasks assessed/performed           Past Medical History:  Diagnosis Date  . Closed displaced comminuted fracture of shaft of left humerus 10/27/2020  . Closed displaced comminuted fracture of shaft of left radius 10/29/2020  . Closed displaced comminuted fracture of shaft of left ulna 10/29/2020  . Depression 2006   after divorce  . Displaced comminuted fracture of shaft of left femur, initial encounter for closed fracture (HCC) 10/29/2020  . 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 Vitamin D insufficiency 10/29/2020    Past Surgical History:  Procedure Laterality Date  . APPENDECTOMY    . FEMUR IM NAIL Left 10/28/2020   Procedure: INTRAMEDULLARY (IM) RETROGRADE FEMORAL NAILING;  Surgeon: Myrene Galas, MD;  [facility]: [facility] OR;  Service: Orthopedics;  Laterality: Left;  . NASAL SINUS SURGERY     polyp removal  . ORIF HUMERUS FRACTURE Left 10/28/2020   Procedure: OPEN REDUCTION INTERNAL FIXATION (ORIF) HUMERAL SHAFT FRACTURE;  Surgeon: Myrene Galas, MD;  Location: [facility] OR;  Service: Orthopedics;   Laterality: Left;  . ORIF RADIAL FRACTURE Left 10/28/2020   Procedure: OPEN REDUCTION INTERNAL FIXATION (ORIF) RADIAL FRACTURE;  Surgeon: Myrene Galas, MD;  Location: [facility] OR;  Service: Orthopedics;  Laterality: Left;    There were no vitals filed for this visit.   Subjective Assessment - 12/09/20 1123    Subjective Patient reports improved leg function and less pain and he has been performing HEP for LLE. Patient reports he has been practicing weight shifting in front of mirror for postural alignment    Currently in Pain? Yes    Pain Score 2     Pain Location Hip    Pain Orientation Left    Pain Descriptors / Indicators Sore    Pain Type Acute pain                     12/09/20 1130  Exercises  Exercises Knee/Hip  Knee/Hip Exercises: Supine  Quad Sets Strengthening;Left;2 sets;10 reps  Heel Slides AAROM;Left;2 sets;10 reps  Hip Adduction Isometric Strengthening;Both;2 sets;10 reps  Other Supine Knee/Hip Exercises Supine hip abd isometric with gait belt 2x10 2 sec hold  Other Supine Knee/Hip Exercises supine hip abduction with straight leg 2x10  Knee/Hip Exercises: Prone  Hamstring Curl 2 sets;10 reps  Other Prone Exercises prone x 2 min  Other Prone Exercises prone heel squeeze 2x10  Shoulder Exercises: Seated  Row  (hands clasped to maintain elbow flexion)  PT Education - 12/09/20 1137    Education Details review of HEP and weight shifting with visual feedback from mirror for symmery in standing    Person(s) Educated Patient    Methods Explanation;Demonstration    Comprehension Verbalized understanding;Returned demonstration            PT Short Term Goals - 12/02/20 1359      PT SHORT TERM GOAL #1   Title Patient will report at least 25% improvement in symptoms for improved quality of life.    Time 3    Period Weeks    Status New    Target Date 12/23/20      PT SHORT TERM GOAL #2   Title Patient will be independent  with HEP in order to improve functional outcomes.    Baseline in development    Time 3    Period Weeks    Status New    Target Date 12/23/20      PT SHORT TERM GOAL #3   Title Patient will demonstrate left hip flexion AROM to 90 degrees to improve functional mobility    Baseline 65 degrees AAROM for flexion in supine    Time 3    Period Weeks    Status New    Target Date 12/23/20             PT Long Term Goals - 12/02/20 1401      PT LONG TERM GOAL #1   Title Patient will report at least 50% improvement in overall symptoms and function to demonstrate overall improved functional ability    Time 6    Period Weeks    Status New    Target Date 01/13/21      PT LONG TERM GOAL #2   Title Patient will demonstrate improved functional ambulation as evidenced by distance of 350 ft during using least restrictive AD    Baseline 110 ft with left platform RW    Time 6    Period Weeks    Status New    Target Date 01/13/21      PT LONG TERM GOAL #3   Title Patient will be able to navigate stairs with reciprocal pattern without compensation in order to demonstrate improved LE strength.    Baseline min A for 2 stairs, step-to with NBQC    Time 6    Period Weeks    Status New    Target Date 01/13/21                 Plan - 12/09/20 1137    Clinical Impression Statement Patient tolerating tx sessions well and demo 75% HEP teach-back from first visit.  Patient motivated to progress activities.  Therapist educated patient on recovery process and techniuqes to modify activities to improve function and decrease pain.  Patient would benefit from continued PT services to progress PRE as indicated and progress gait training to normalize pattern and progress for less restrictive AD    Personal Factors and Comorbidities Behavior Pattern;Comorbidity 3+    Comorbidities left humerus fracture, radial nerve injury, left radius/ulna fracture s/p ORIF    Examination-Activity Limitations  Bed Mobility;Bend;Carry;Dressing;Lift;Toileting;Stand;Stairs;Squat;Sit;Transfers;Locomotion Level    Examination-Participation Restrictions Cleaning;Community Activity;Driving;Laundry;Yard Work;Shop;Occupation;Meal Prep    Stability/Clinical Decision Making Evolving/Moderate complexity    Rehab Potential Good    PT Frequency 2x / week    PT Duration 6 weeks    PT Treatment/Interventions ADLs/Self Care Home Management;Aquatic Therapy;Biofeedback;Cryotherapy;Electrical Stimulation;DME Instruction;Ultrasound;Traction;Moist Heat;Gait training;Stair training;Functional mobility training;Therapeutic activities;Therapeutic exercise;Balance  training;Patient/family education;Neuromuscular re-education;Wheelchair mobility training;Manual techniques;Manual lymph drainage;Compression bandaging;Passive range of motion;Taping;Splinting;Energy conservation;Dry needling;Spinal Manipulations;Joint Manipulations    PT Next Visit Plan Continue with LE ROM and PRE as tolerated. Possible use of harness/sky track for offloading ambulation. Progress bed exercises    PT Home Exercise Plan QS, heel slides, hip adduction, hip abduction isometric with belt, supine hip abduction with belt assist    Consulted and Agree with Plan of Care Patient;Family member/caregiver    Family Member Consulted patient's sister           Patient will benefit from skilled therapeutic intervention in order to improve the following deficits and impairments:  Abnormal gait,Decreased activity tolerance,Decreased balance,Decreased mobility,Decreased knowledge of use of DME,Decreased knowledge of precautions,Decreased endurance,Decreased coordination,Decreased range of motion,Decreased safety awareness,Decreased strength,Increased edema,Difficulty walking,Impaired perceived functional ability,Improper body mechanics,Impaired UE functional use,Pain  Visit Diagnosis: Difficulty in walking, not elsewhere classified  Muscle weakness  (generalized)  Unsteadiness on feet  Displaced comminuted fracture of shaft of left femur, initial encounter for closed fracture [facility])     Problem List Patient Active Problem List   Diagnosis Date Noted  . Critical polytrauma 11/15/2020  . Closed displaced comminuted fracture of shaft of left radius 10/29/2020  . Closed displaced comminuted fracture of shaft of left ulna 10/29/2020  . Displaced comminuted fracture of shaft of left femur, initial encounter for closed fracture (HCC) 10/29/2020  . Vitamin D insufficiency 10/29/2020  . 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 Closed displaced comminuted fracture of shaft of left humerus 10/27/2020  . Hypogonadism in male 08/15/2019  . Other male erectile dysfunction 11/08/2016  . Depression 10/05/2016  . Current every day smoker 10/05/2016  . Weight gain, abnormal 10/05/2016  . Generalized anxiety disorder 03/01/2016    11:57 AM, 12/09/20 M. Shary Decamp, PT, DPT Physical Therapist- [place] Office Number: [phone]  [facility] [address] [place], [place], [address] Phone: [phone]   Fax:  [phone]  Name: Jason Alexander MRN: [id number] Date of Birth: [birth year]

## 2020-12-09 NOTE — Patient Instructions (Signed)
1) SHOULDER: Flexion On Table   Place hands on towel placed on table, elbows straight. Lean forward with you upper body, pushing towel away from body.  __10-15_ reps per set, _3-5__ sets per day   2) Internal Rotation (Assistive)   Seated with elbow bent at right angle and held against side, slide arm on table surface in an inward arc keeping elbow anchored in place. Repeat __10-15__ times. Do _3-5___ sessions per day. Activity: Use this motion to brush crumbs off the table.  Copyright  VHI. All rights reserved.    3) Seated Row   Sit up straight with elbows by your sides-clasp your hands to maintain elbow flexion. Pull back with shoulders/elbows, keeping forearms straight, as if pulling back on the reins of a horse. Squeeze shoulder blades together. Repeat _10-15__times, __2-3__sets/day    4) Shoulder Elevation    Sit up straight with arms by your sides. Slowly bring your shoulders up towards your ears. Repeat_10-15__times, __2-3__ sets/day    5) Shoulder Extension    Sit up straight with both arms by your side, draw your arms back behind your waist. Keep your elbows straight. Repeat __10-15__times, __2-3__sets/day.    Self-AAROM Wrist Exercises   6) Wrist Flexion/Extension                1. Interlock your fingers with the affected thumb on top. Grasp your affected hand 2. Slowly bend your wrist forward then backward. 3. Repeat ___10-15___times. 3-5X/day  7) Wrist Ulnar/Radial Deviation         1. Interlock your fingers with the affected thumb on top. Grasp your affected hand 2. Slowly bend your wrist towards you then away from you. 3. Repeat ___10-15___times. 3-5X/day  AROM Exercises   8) Wrist Flexion  Start with wrist at edge of table, palm facing up. With wrist hanging slightly off table, curl wrist upward, and back down.        9) WRIST PRONATION  Turn your forearm towards palm face down.  Keep your elbow bent and by the side of your   Body.      10) WRIST SUPINATION  Turn your forearm towards palm face up.  Keep your elbow bent and by the side of your  Body.

## 2020-12-09 NOTE — Patient Instructions (Signed)
Access Code: [email or web address] URL: [email or web address] Date: 12/09/2020 Prepared by: Shary Decamp  Exercises Hooklying Isometric Hip Abduction with Belt - 1 x daily - 7 x weekly - 3 sets - 10 reps - 3 sec hold Supine Hip Abduction - 1 x daily - 7 x weekly - 3 sets - 10 reps

## 2020-12-10 ENCOUNTER — Ambulatory Visit (INDEPENDENT_AMBULATORY_CARE_PROVIDER_SITE_OTHER): Payer: Medicaid Other | Admitting: Clinical

## 2020-12-10 DIAGNOSIS — F4310 Post-traumatic stress disorder, unspecified: Secondary | ICD-10-CM

## 2020-12-10 DIAGNOSIS — F331 Major depressive disorder, recurrent, moderate: Secondary | ICD-10-CM | POA: Diagnosis not present

## 2020-12-10 DIAGNOSIS — F419 Anxiety disorder, unspecified: Secondary | ICD-10-CM
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
## 2020-12-11 ENCOUNTER — Encounter (HOSPITAL_COMMUNITY): Payer: Self-pay | Admitting: Physical Therapy

## 2020-12-11 ENCOUNTER — Ambulatory Visit (HOSPITAL_COMMUNITY): Payer: Medicaid Other | Admitting: Occupational Therapy

## 2020-12-11 ENCOUNTER — Other Ambulatory Visit: Payer: Self-pay

## 2020-12-11 ENCOUNTER — Telehealth: Payer: Self-pay | Admitting: *Deleted

## 2020-12-11 ENCOUNTER — Encounter (HOSPITAL_COMMUNITY): Payer: Self-pay | Admitting: Occupational Therapy

## 2020-12-11 ENCOUNTER — Ambulatory Visit (HOSPITAL_COMMUNITY): Payer: Medicaid Other | Admitting: Physical Therapy

## 2020-12-11 DIAGNOSIS — M25512 Pain in left shoulder: Secondary | ICD-10-CM | POA: Diagnosis not present

## 2020-12-11 DIAGNOSIS — R2681 Unsteadiness on feet: Secondary | ICD-10-CM

## 2020-12-11 DIAGNOSIS — R278 Other lack of coordination: Secondary | ICD-10-CM | POA: Diagnosis not present

## 2020-12-11 DIAGNOSIS — S72352A Displaced comminuted fracture of shaft of left femur, initial encounter for closed fracture: Secondary | ICD-10-CM

## 2020-12-11 DIAGNOSIS — R262 Difficulty in walking, not elsewhere classified: Secondary | ICD-10-CM | POA: Diagnosis not present

## 2020-12-11 DIAGNOSIS — R29898 Other symptoms and signs involving the musculoskeletal system: Secondary | ICD-10-CM | POA: Diagnosis not present

## 2020-12-11 DIAGNOSIS — M25612 Stiffness of left shoulder, not elsewhere classified: Secondary | ICD-10-CM

## 2020-12-11 DIAGNOSIS — R29818 Other symptoms and signs involving the nervous system: Secondary | ICD-10-CM

## 2020-12-11 DIAGNOSIS — M79602 Pain in left arm: Secondary | ICD-10-CM

## 2020-12-11 DIAGNOSIS — M6281 Muscle weakness (generalized): Secondary | ICD-10-CM

## 2020-12-11 NOTE — Telephone Encounter (Signed)
Verlon Au OT [facility] called to say that they are seeing Mr Ostrand and will be seeing him multiple times before he comes to our office on 01/02/21 to see Dr Carlis Abbott.  She says that they have not received any "updated precautions" on him since Jesusita Oka wrote the discharge.  Can you advise and send the message to Ezra Sites OT [facility])?

## 2020-12-11 NOTE — Patient Instructions (Signed)
Access Code: [email or web address] URL: [email or web address] Date: 12/11/2020 Prepared by: Greig Castilla Eann Cleland  Exercises Prone Hip Extension - 1 x daily - 7 x weekly - 2 sets - 10 reps Sidelying Hip Abduction - 1 x daily - 7 x weekly - 2 sets - 10 reps Supine Active Straight Leg Raise - 1 x daily - 7 x weekly - 2 sets - 10 reps Standing March with Counter Support - 1 x daily - 7 x weekly - 2 sets - 10 reps

## 2020-12-11 NOTE — Therapy (Signed)
[facility] [address] [place], [place], [address] Phone: [phone]   Fax:  [phone]  Occupational Therapy Treatment  Patient Details  Name: Jason Alexander MRN: [id number] Date of Birth: [birth year] Referring Provider (OT): Mariam Dollar, PA-C   Encounter Date: 12/11/2020   OT End of Session - 12/11/20 1616    Visit Number 3    Number of Visits 12    Date for OT Re-Evaluation 01/13/21    Authorization Type [facility] Medicaid    Authorization Time Period 27 visit limit combined PT/OT/SP; 3 visits approved from 12/04/20-01/04/21 (can request more when the first 3 visits are completed regardless of date)    Authorization - Visit Number 2    Authorization - Number of Visits 3    OT Start Time 1445    OT Stop Time 1605    OT Time Calculation (min) 80 min    Activity Tolerance Patient tolerated treatment well    Behavior During Therapy [facility] for tasks assessed/performed           Past Medical History:  Diagnosis Date  . Closed displaced comminuted fracture of shaft of left humerus 10/27/2020  . Closed displaced comminuted fracture of shaft of left radius 10/29/2020  . Closed displaced comminuted fracture of shaft of left ulna 10/29/2020  . Depression 2006   after divorce  . Displaced comminuted fracture of shaft of left femur, initial encounter for closed fracture (HCC) 10/29/2020  . 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 Vitamin D insufficiency 10/29/2020    Past Surgical History:  Procedure Laterality Date  . APPENDECTOMY    . FEMUR IM NAIL Left 10/28/2020   Procedure: INTRAMEDULLARY (IM) RETROGRADE FEMORAL NAILING;  Surgeon: Myrene Galas, MD;  Location: [facility] OR;  Service: Orthopedics;  Laterality: Left;  . NASAL SINUS SURGERY     polyp removal  . ORIF HUMERUS FRACTURE Left 10/28/2020   Procedure: OPEN REDUCTION INTERNAL FIXATION (ORIF) HUMERAL SHAFT FRACTURE;  Surgeon: Myrene Galas, MD;  Location: [facility] OR;  Service:  Orthopedics;  Laterality: Left;  . ORIF RADIAL FRACTURE Left 10/28/2020   Procedure: OPEN REDUCTION INTERNAL FIXATION (ORIF) RADIAL FRACTURE;  Surgeon: Myrene Galas, MD;  Location: [facility] OR;  Service: Orthopedics;  Laterality: Left;    There were no vitals filed for this visit.   Subjective Assessment - 12/11/20 1450    Subjective  S: I was very sore from last time.    Currently in Pain? Yes    Pain Score 3     Pain Location Shoulder    Pain Orientation Left    Pain Descriptors / Indicators Sore    Pain Type Acute pain    Pain Radiating Towards None    Pain Onset 1 to 4 weeks ago    Pain Frequency Intermittent    Aggravating Factors  increased use    Pain Relieving Factors rest    Effect of Pain on Daily Activities mod difficulty ADLs    [name]              [facility] OT Assessment - 12/11/20 1448      Assessment   Medical Diagnosis s/p left humerus ORIF, left ulna and radius ORIF, radius grafting, exploration of radial nerve      Precautions   Precautions Fall;Shoulder;Other (comment)    Type of Shoulder Precautions P/ROM, A/ROM; NO active abduction    Precaution Comments Waiting to hear back from PA regarding updated precautions  Restrictions   Weight Bearing Restrictions Yes    LUE Weight Bearing Weight bearing as tolerated   through elbow   LLE Weight Bearing Weight bearing as tolerated    Other Position/Activity Restrictions using platform RW                    OT Treatments/Exercises (OP) - 12/11/20 1452      Exercises   Exercises Shoulder;Elbow;Wrist;Hand      Shoulder Exercises: Supine   Protraction PROM;5 reps;AROM;10 reps    Horizontal ABduction PROM;5 reps    External Rotation PROM;5 reps    Internal Rotation PROM;5 reps    Flexion PROM;5 reps    ABduction PROM;5 reps      Shoulder Exercises: Seated   Extension AROM;10 reps    Row AROM;10 reps   hands clasped to maintain elbow flexion   Other Seated Exercises seated at  table: er/IR 10X each    Other Seated Exercises seated at table: foward slide with washcloth 10X palm down; circles each direction wiping table, 10X palm down      Shoulder Exercises: Isometric Strengthening   Flexion Supine;5X5"    Extension Supine;5X5"    External Rotation Supine;5X5"    Internal Rotation Supine;5X5"    ABduction Supine;5X5"    ADduction Supine;5X5"      Elbow Exercises   Elbow Extension PROM;5 reps;AROM;10 reps    Forearm Supination PROM;AROM;10 reps    Forearm Pronation PROM;AROM;10 reps    Other elbow exercises elbow flexion, P/ROM 10X; A/ROM-with forearm pronated beginning at 90 degrees while supine and flexing to full ROM then extending slowly, 5X    Other elbow exercises elbow flexion isometrics with forearm pronated 3X5"      Wrist Exercises   Wrist Flexion AROM;10 reps    Wrist Extension PROM;AAROM;10 reps    Wrist Radial Deviation PROM;Self ROM;10 reps    Wrist Ulnar Deviation PROM;Self ROM;10 reps    Other wrist exercises wrist flexion/extension P/ROM, 5X      Hand Exercises   Opposition AROM;10 reps    Other Hand Exercises composite digit flexion 10X      Manual Therapy   Manual Therapy Myofascial release;Edema management    Manual therapy comments completed separately from therapeutic exercises    Edema Management retrograde massage to volar and dorsal forearm to decrease edema and improve mobility    Myofascial Release myofascial release to left forearm, upper arm, trapezius, and scapula to decrease pain and fascial restrictions and improve joint ROM                    OT Short Term Goals - 12/09/20 0943      OT SHORT TERM GOAL #1   Title Pt will be provided with and educated on HEP to improve LUE use as non-dominant during ADL completion.    Time 3    Period Weeks    Status On-going    Target Date 12/23/20      OT SHORT TERM GOAL #2   Title Pt will increase LUE P/ROM to [facility] to improve mobility throughout LUE required to perform  dressing and bathing tasks using LUE as assist.    Time 3    Period Weeks    Status On-going      OT SHORT TERM GOAL #3   Title Pt will decrease LUE fascial restrictions to moderate amount to improve mobility required for functional reaching tasks.    Time 3    Period  Weeks    Status On-going      OT SHORT TERM GOAL #4   Title Pt will increase left elbow strength to 2/5 flexion and 3+/5 extension to improve ability to use LUE to assist during UB dressing tasks.    Time 3    Period Weeks    Status On-going      OT SHORT TERM GOAL #5   Title Pt will be provided with necessary splints and educated on wear and care of splints for LUE wrist stability and functional use.    Time 3    Period Weeks    Status On-going      OT SHORT TERM GOAL #6   Title Pt will be educated on and verbalize appropriate understanding and utilization of sensory reintegration strategies.    Time 3    Period Weeks    Status On-going             OT Long Term Goals - 12/09/20 0943      OT LONG TERM GOAL #1   Title Pt will decrease pain in LUE to 3/10 or less to improve ability to sleep for 3+ consecutive hours without waking due to pain.    Time 6    Period Weeks    Status On-going      OT LONG TERM GOAL #2   Title Pt will increase LUE A/ROM to [facility] to improve ability to use LUE as assist during eating and grooming tasks.    Time 6    Period Weeks    Status On-going      OT LONG TERM GOAL #3   Title Pt will increase LUE shoulder strength to 4/5 or greater to improve ability to participate in exercise routine.    Time 6    Period Weeks    Status On-going      OT LONG TERM GOAL #4   Title Pt will increase LUE elbow strength to 4-/5 flexion and 4/5 extension to improve ability to bring washcloth to face.    Time 6    Period Weeks    Status On-going      OT LONG TERM GOAL #5   Title Pt will increase LUE wrist strength to 4/5 flexion and 3/5 extension to improve ability to turn items (keys, tops  to jars, etc) during ADLs.    Time 6    Period Weeks    Status On-going      OT LONG TERM GOAL #6   Title Pt will increase LUE grip strength by 25# and pinch strength by 6# to improve ability to grasp and maintain hold on items during functional use.    Time 6    Period Weeks    Status On-going      OT LONG TERM GOAL #7   Title Pt will increase fine motor coordination by completing 9 hole peg test in under 30" to improve ability to manipulate small items during ADLs.    Time 6    Period Weeks    Status On-going                 Plan - 12/11/20 1558    Clinical Impression Statement A: Pt reports he has not completed HEP with exception of a couple exercises after therapy a couple of days ago. Pt with bicep activiation with forearm pronated, unable to activate bicep flexion with forearm in neutral or supinated, even with trialing NMES. Pt noted to have some digit extension,  able to extend to MCPs at 90 after making full fist. Added shoulder A/ROM in supine for protraction and flexion, in sitting for flexion. Completed gravity eliminated tasks at tabletop for shoulder. Verbal cuing for form and technique. Educated pt on need for follow-up with orthopedic surgeon and provided surgeon's name and contact information to schedule.    Body Structure / Function / Physical Skills ADL;Endurance;UE functional use;Fascial restriction;Flexibility;Pain;FMC;ROM;Coordination;Sensation;IADL;Strength;Mobility;Edema    Plan P: Request additional visits. Follow up on HEP, defer dynamic radial nerve palsy splint until bicep activation is improved; consider wrist cockup splint for wrist stabilization while working on fine motor tasks    Consulted and Agree with Plan of Care Patient           Patient will benefit from skilled therapeutic intervention in order to improve the following deficits and impairments:   Body Structure / Function / Physical Skills: ADL,Endurance,UE functional use,Fascial  restriction,Flexibility,Pain,FMC,ROM,Coordination,Sensation,IADL,Strength,Mobility,Edema       Visit Diagnosis: Acute pain of left shoulder  Pain in left arm  Stiffness of left shoulder, not elsewhere classified  Other symptoms and signs involving the musculoskeletal system  Other symptoms and signs involving the nervous system  Other lack of coordination    Problem List Patient Active Problem List   Diagnosis Date Noted  . Critical polytrauma 11/15/2020  . Closed displaced comminuted fracture of shaft of left radius 10/29/2020  . Closed displaced comminuted fracture of shaft of left ulna 10/29/2020  . Displaced comminuted fracture of shaft of left femur, initial encounter for closed fracture (HCC) 10/29/2020  . Vitamin D insufficiency 10/29/2020  . 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 Closed displaced comminuted fracture of shaft of left humerus 10/27/2020  . Hypogonadism in male 08/15/2019  . Other male erectile dysfunction 11/08/2016  . Depression 10/05/2016  . Current every day smoker 10/05/2016  . Weight gain, abnormal 10/05/2016  . Generalized anxiety disorder 03/01/2016    Jason Alexander, Jason Alexander  [phone] 12/11/2020, 4:17 PM  [facility] [address] [place], [place], [address] Phone: [phone]   Fax:  [phone]  Name: Jason Alexander MRN: [id number] Date of Birth: Sep 10, 1976

## 2020-12-11 NOTE — Therapy (Signed)
[facility] [address] [place], [place], [address] Phone: [phone]   Fax:  [phone]  Physical Therapy Treatment  Patient Details  Name: Jason Alexander MRN: [id number] Date of Birth: Sep 30, 1976 Referring Provider (PT): Montez Morita   Encounter Date: 12/11/2020   PT End of Session - 12/11/20 1404    Visit Number 3    Number of Visits 12    Date for PT Re-Evaluation 01/13/21    Authorization Type Medicaid [employer], request 12 visits, check auth. 12 visits approved    Authorization Time Period 12/02/20-01/31/21    Authorization - Visit Number 3    Authorization - Number of Visits 12    Progress Note Due on Visit 10    PT Start Time 1404    PT Stop Time 1444    PT Time Calculation (min) 40 min    Equipment Utilized During Treatment Gait belt    Activity Tolerance Patient tolerated treatment well    Behavior During Therapy WFL for tasks assessed/performed           Past Medical History:  Diagnosis Date  . Closed displaced comminuted fracture of shaft of left humerus 10/27/2020  . Closed displaced comminuted fracture of shaft of left radius 10/29/2020  . Closed displaced comminuted fracture of shaft of left ulna 10/29/2020  . Depression 2006   after divorce  . Displaced comminuted fracture of shaft of left femur, initial encounter for closed fracture (HCC) 10/29/2020  . 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 Vitamin D insufficiency 10/29/2020    Past Surgical History:  Procedure Laterality Date  . APPENDECTOMY    . FEMUR IM NAIL Left 10/28/2020   Procedure: INTRAMEDULLARY (IM) RETROGRADE FEMORAL NAILING;  Surgeon: Myrene Galas, MD;  [facility]: [facility] OR;  Service: Orthopedics;  Laterality: Left;  . NASAL SINUS SURGERY     polyp removal  . ORIF HUMERUS FRACTURE Left 10/28/2020   Procedure: OPEN REDUCTION INTERNAL FIXATION (ORIF) HUMERAL SHAFT FRACTURE;  Surgeon: Myrene Galas, MD;  Location: [facility] OR;  Service: Orthopedics;   Laterality: Left;  . ORIF RADIAL FRACTURE Left 10/28/2020   Procedure: OPEN REDUCTION INTERNAL FIXATION (ORIF) RADIAL FRACTURE;  Surgeon: Myrene Galas, MD;  Location: [facility] OR;  Service: Orthopedics;  Laterality: Left;    There were no vitals filed for this visit.   Subjective Assessment - 12/11/20 1409    Subjective Patient has not had to take any pain meds. He has been doing his home exercises.    Currently in Pain? Yes    Pain Score 3     Pain Location Hip    Pain Orientation Left                             [facility] Adult PT Treatment/Exercise - 12/11/20 0001      Knee/Hip Exercises: Aerobic   Recumbent Bike 5 minutes for ROM      Knee/Hip Exercises: Standing   Heel Raises 15 reps    Knee Flexion Both;2 sets;10 reps    Functional Squat 10 reps    Gait Training 50 feet with quad cane    Other Standing Knee Exercises marching 2x10 with unilateral UE support    Other Standing Knee Exercises lateral stepping 4x 10      Knee/Hip Exercises: Supine   Straight Leg Raises Both;2 sets;10 reps      Knee/Hip Exercises: Sidelying   Hip ABduction 2 sets;Left;10 reps  Knee/Hip Exercises: Prone   Hip Extension Left;2 sets;10 reps                    PT Short Term Goals - 12/02/20 1359      PT SHORT TERM GOAL #1   Title Patient will report at least 25% improvement in symptoms for improved quality of life.    Time 3    Period Weeks    Status New    Target Date 12/23/20      PT SHORT TERM GOAL #2   Title Patient will be independent with HEP in order to improve functional outcomes.    Baseline in development    Time 3    Period Weeks    Status New    Target Date 12/23/20      PT SHORT TERM GOAL #3   Title Patient will demonstrate left hip flexion AROM to 90 degrees to improve functional mobility    Baseline 65 degrees AAROM for flexion in supine    Time 3    Period Weeks    Status New    Target Date 12/23/20             PT Long Term  Goals - 12/02/20 1401      PT LONG TERM GOAL #1   Title Patient will report at least 50% improvement in overall symptoms and function to demonstrate overall improved functional ability    Time 6    Period Weeks    Status New    Target Date 01/13/21      PT LONG TERM GOAL #2   Title Patient will demonstrate improved functional ambulation as evidenced by distance of 350 ft during using least restrictive AD    Baseline 110 ft with left platform RW    Time 6    Period Weeks    Status New    Target Date 01/13/21      PT LONG TERM GOAL #3   Title Patient will be able to navigate stairs with reciprocal pattern without compensation in order to demonstrate improved LE strength.    Baseline min A for 2 stairs, step-to with NBQC    Time 6    Period Weeks    Status New    Target Date 01/13/21                 Plan - 12/11/20 1407    Clinical Impression Statement Patient able to complete 3 way up against gravity with long lever requiring min/no cueing to complete. He has greatest difficulty with hip extension secondary to impaired glute strength. Patient requires unilateral UE support for marches today. Patient able to complete mini squat with UE support and fatigues quickly. Patient highly motivated to and eager to improve function. Patient will continue to benefit skilled physical therapy in order to reduce impairment and improve function.    Personal Factors and Comorbidities Behavior Pattern;Comorbidity 3+    Comorbidities left humerus fracture, radial nerve injury, left radius/ulna fracture s/p ORIF    Examination-Activity Limitations Bed Mobility;Bend;Carry;Dressing;Lift;Toileting;Stand;Stairs;Squat;Sit;Transfers;Locomotion Level    Examination-Participation Restrictions Cleaning;Community Activity;Driving;Laundry;Yard Work;Shop;Occupation;Meal Prep    Stability/Clinical Decision Making Evolving/Moderate complexity    Rehab Potential Good    PT Frequency 2x / week    PT  Duration 6 weeks    PT Treatment/Interventions ADLs/Self Care Home Management;Aquatic Therapy;Biofeedback;Cryotherapy;Electrical Stimulation;DME Instruction;Ultrasound;Traction;Moist Heat;Gait training;Stair training;Functional mobility training;Therapeutic activities;Therapeutic exercise;Balance training;Patient/family education;Neuromuscular re-education;Wheelchair mobility training;Manual techniques;Manual lymph drainage;Compression bandaging;Passive range of motion;Taping;Splinting;Energy conservation;Dry needling;Spinal  Manipulations;Joint Manipulations    PT Next Visit Plan Continue with LE ROM and PRE as tolerated. Possible use of harness/sky track for offloading ambulation. Progress bed exercises    PT Home Exercise Plan QS, heel slides, hip adduction, hip abduction isometric with belt, supine hip abduction with belt assist    Consulted and Agree with Plan of Care Patient;Family member/caregiver    Family Member Consulted patient's sister           Patient will benefit from skilled therapeutic intervention in order to improve the following deficits and impairments:  Abnormal gait,Decreased activity tolerance,Decreased balance,Decreased mobility,Decreased knowledge of use of DME,Decreased knowledge of precautions,Decreased endurance,Decreased coordination,Decreased range of motion,Decreased safety awareness,Decreased strength,Increased edema,Difficulty walking,Impaired perceived functional ability,Improper body mechanics,Impaired UE functional use,Pain  Visit Diagnosis: Difficulty in walking, not elsewhere classified  Muscle weakness (generalized)  Unsteadiness on feet  Displaced comminuted fracture of shaft of left femur, initial encounter for closed fracture [facility])     Problem List Patient Active Problem List   Diagnosis Date Noted  . Critical polytrauma 11/15/2020  . Closed displaced comminuted fracture of shaft of left radius 10/29/2020  . Closed displaced comminuted fracture  of shaft of left ulna 10/29/2020  . Displaced comminuted fracture of shaft of left femur, initial encounter for closed fracture ([place]) 10/29/2020  . Vitamin D insufficiency 10/29/2020  . 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 Closed displaced comminuted fracture of shaft of left humerus 10/27/2020  . Hypogonadism in male 08/15/2019  . Other male erectile dysfunction 11/08/2016  . Depression 10/05/2016  . Current every day smoker 10/05/2016  . Weight gain, abnormal 10/05/2016  . Generalized anxiety disorder 03/01/2016   2:45 PM, 12/11/20 Mearl Latin PT, DPT Physical Therapist at [place], [place], [address] Phone: [phone]   Fax:  [phone]  Name: Jason Alexander MRN: [id number] Date of Birth: 05-Jun-1976

## 2020-12-12 NOTE — Telephone Encounter (Signed)
I have called Verlon Au, thank you!

## 2020-12-16 ENCOUNTER — Ambulatory Visit (HOSPITAL_COMMUNITY): Payer: Medicaid Other

## 2020-12-16 ENCOUNTER — Other Ambulatory Visit: Payer: Self-pay

## 2020-12-16 ENCOUNTER — Encounter (HOSPITAL_COMMUNITY): Payer: Self-pay

## 2020-12-16 DIAGNOSIS — R29818 Other symptoms and signs involving the nervous system: Secondary | ICD-10-CM

## 2020-12-16 DIAGNOSIS — R29898 Other symptoms and signs involving the musculoskeletal system: Secondary | ICD-10-CM

## 2020-12-16 DIAGNOSIS — R262 Difficulty in walking, not elsewhere classified: Secondary | ICD-10-CM | POA: Diagnosis not present

## 2020-12-16 DIAGNOSIS — R278 Other lack of coordination: Secondary | ICD-10-CM

## 2020-12-16 DIAGNOSIS — M25612 Stiffness of left shoulder, not elsewhere classified: Secondary | ICD-10-CM

## 2020-12-16 DIAGNOSIS — M6281 Muscle weakness (generalized): Secondary | ICD-10-CM | POA: Diagnosis not present

## 2020-12-16 DIAGNOSIS — M25512 Pain in left shoulder: Secondary | ICD-10-CM

## 2020-12-16 DIAGNOSIS — R2681 Unsteadiness on feet: Secondary | ICD-10-CM | POA: Diagnosis not present

## 2020-12-16 DIAGNOSIS — M79602 Pain in left arm: Secondary | ICD-10-CM | POA: Diagnosis not present

## 2020-12-16 DIAGNOSIS — S72352A Displaced comminuted fracture of shaft of left femur, initial encounter for closed fracture: Secondary | ICD-10-CM | POA: Diagnosis not present

## 2020-12-16 NOTE — Therapy (Signed)
[facility] [address] [place], [place], [address] Phone: [phone]   Fax:  [phone]  Physical Therapy Treatment  Patient Details  Name: Jason Alexander MRN: [id number] Date of Birth: January 07, 1976 Referring Provider (PT): Montez Morita   Encounter Date: 12/16/2020   PT End of Session - 12/16/20 1557    Visit Number 4    Number of Visits 12    Date for PT Re-Evaluation 01/13/21    Authorization Type Medicaid [employer], request 12 visits, check auth. 12 visits approved    Authorization Time Period 12/02/20-01/31/21    Authorization - Visit Number 4    Authorization - Number of Visits 12    Progress Note Due on Visit 10    PT Start Time 1555    PT Stop Time 1638    PT Time Calculation (min) 43 min    Activity Tolerance Patient tolerated treatment well           Past Medical History:  Diagnosis Date  . Closed displaced comminuted fracture of shaft of left humerus 10/27/2020  . Closed displaced comminuted fracture of shaft of left radius 10/29/2020  . Closed displaced comminuted fracture of shaft of left ulna 10/29/2020  . Depression 2006   after divorce  . Displaced comminuted fracture of shaft of left femur, initial encounter for closed fracture (HCC) 10/29/2020  . 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 Vitamin D insufficiency 10/29/2020    Past Surgical History:  Procedure Laterality Date  . APPENDECTOMY    . FEMUR IM NAIL Left 10/28/2020   Procedure: INTRAMEDULLARY (IM) RETROGRADE FEMORAL NAILING;  Surgeon: Myrene Galas, MD;  Location: [facility] OR;  Service: Orthopedics;  Laterality: Left;  . NASAL SINUS SURGERY     polyp removal  . ORIF HUMERUS FRACTURE Left 10/28/2020   Procedure: OPEN REDUCTION INTERNAL FIXATION (ORIF) HUMERAL SHAFT FRACTURE;  Surgeon: Myrene Galas, MD;  Location: [facility] OR;  Service: Orthopedics;  Laterality: Left;  . ORIF RADIAL FRACTURE Left 10/28/2020   Procedure: OPEN REDUCTION INTERNAL FIXATION (ORIF)  RADIAL FRACTURE;  Surgeon: Myrene Galas, MD;  Location: [facility] OR;  Service: Orthopedics;  Laterality: Left;    There were no vitals filed for this visit.   Subjective Assessment - 12/16/20 1556    Subjective Patient reports he is feeling better and walking more with NBQC and working on steps at his house.  Patient presents today using NBQC and ambulating with greater velocity    Currently in Pain? No/denies    Pain Location Hip    Pain Orientation Left    Pain Descriptors / Indicators Sore    Pain Type Chronic pain              [facility] PT Assessment - 12/16/20 0001      AROM   Left Hip Flexion 90      Strength   Right/Left Knee Left    Left Knee Flexion 3+/5    Left Knee Extension 3+/5                         [facility] Adult PT Treatment/Exercise - 12/16/20 0001      Knee/Hip Exercises: Standing   Knee Flexion Strengthening;Left;2 sets;10 reps   green t-loop   Other Standing Knee Exercises sidestepping with green t-loop x 2 min    Other Standing Knee Exercises retrowalking 3x30 ft to facilitate hip extension      Knee/Hip Exercises: Seated   Long Arc  Quad Strengthening;Both;4 sets;10 reps;Weights   5 lbs, then 10 lbs     Knee/Hip Exercises: Supine   Heel Slides AAROM;Left;2 sets;10 reps    Bridges Strengthening;Both;2 sets;10 reps   feet placed in dorsiflexion   Straight Leg Raises Both;2 sets;10 reps    Other Supine Knee/Hip Exercises supine hip flexion with green t-loop 2x10                  PT Education - 12/16/20 1621    Education Details review and additions of HEP and education on aquatic activities to perform in shallow-end and use of water shoes for traction.  Patient reports he will attend with his family for assistance    Person(s) Educated Patient    Methods Explanation    Comprehension Verbalized understanding;Returned demonstration            PT Short Term Goals - 12/16/20 1629      PT SHORT TERM GOAL #1   Title Patient will  report at least 25% improvement in symptoms for improved quality of life.    Time 3    Period Weeks    Status New    Target Date 12/23/20      PT SHORT TERM GOAL #2   Title Patient will be independent with HEP in order to improve functional outcomes.    Baseline in development    Time 3    Period Weeks    Status New    Target Date 12/23/20      PT SHORT TERM GOAL #3   Title Patient will demonstrate left hip flexion AROM to 90 degrees to improve functional mobility    Baseline 65 degrees AAROM for flexion in supine    Time 3    Period Weeks    Status Achieved    Target Date 12/23/20             PT Long Term Goals - 12/02/20 1401      PT LONG TERM GOAL #1   Title Patient will report at least 50% improvement in overall symptoms and function to demonstrate overall improved functional ability    Time 6    Period Weeks    Status New    Target Date 01/13/21      PT LONG TERM GOAL #2   Title Patient will demonstrate improved functional ambulation as evidenced by distance of 350 ft during using least restrictive AD    Baseline 110 ft with left platform RW    Time 6    Period Weeks    Status New    Target Date 01/13/21      PT LONG TERM GOAL #3   Title Patient will be able to navigate stairs with reciprocal pattern without compensation in order to demonstrate improved LE strength.    Baseline min A for 2 stairs, step-to with NBQC    Time 6    Period Weeks    Status New    Target Date 01/13/21                 Plan - 12/16/20 1622    Clinical Impression Statement Patient demonstrates improved gait mechanics and is ambulating fluidly with NBQC albeit with decreased LLE appreciated but demonstrates global improvements in hip ROM to 90 degrees without significant discomfort.  Remains motivated to progress with PRE and reports he will initiate aquatic activities with his family at the local [employer].  Pt advised to use water shoes for traction and  family member to guard  while walking on wet surfaces.    Personal Factors and Comorbidities Behavior Pattern;Comorbidity 3+    Comorbidities left humerus fracture, radial nerve injury, left radius/ulna fracture s/p ORIF    Examination-Activity Limitations Bed Mobility;Bend;Carry;Dressing;Lift;Toileting;Stand;Stairs;Squat;Sit;Transfers;Locomotion Level    Examination-Participation Restrictions Cleaning;Community Activity;Driving;Laundry;Yard Work;Shop;Occupation;Meal Prep    Stability/Clinical Decision Making Evolving/Moderate complexity    Rehab Potential Good    PT Frequency 2x / week    PT Duration 6 weeks    PT Treatment/Interventions ADLs/Self Care Home Management;Aquatic Therapy;Biofeedback;Cryotherapy;Electrical Stimulation;DME Instruction;Ultrasound;Traction;Moist Heat;Gait training;Stair training;Functional mobility training;Therapeutic activities;Therapeutic exercise;Balance training;Patient/family education;Neuromuscular re-education;Wheelchair mobility training;Manual techniques;Manual lymph drainage;Compression bandaging;Passive range of motion;Taping;Splinting;Energy conservation;Dry needling;Spinal Manipulations;Joint Manipulations    PT Next Visit Plan Continue with LE ROM and PRE as tolerated. Possible use of harness/sky track for offloading ambulation. Progress bed exercises    PT Home Exercise Plan QS, heel slides, hip adduction, hip abduction isometric with belt, supine hip abduction with belt assist    Consulted and Agree with Plan of Care Patient;Family member/caregiver    Family Member Consulted patient's sister           Patient will benefit from skilled therapeutic intervention in order to improve the following deficits and impairments:  Abnormal gait,Decreased activity tolerance,Decreased balance,Decreased mobility,Decreased knowledge of use of DME,Decreased knowledge of precautions,Decreased endurance,Decreased coordination,Decreased range of motion,Decreased safety awareness,Decreased  strength,Increased edema,Difficulty walking,Impaired perceived functional ability,Improper body mechanics,Impaired UE functional use,Pain  Visit Diagnosis: Difficulty in walking, not elsewhere classified  Muscle weakness (generalized)  Unsteadiness on feet  Displaced comminuted fracture of shaft of left femur, initial encounter for closed fracture [facility])     Problem List Patient Active Problem List   Diagnosis Date Noted  . Critical polytrauma 11/15/2020  . Closed displaced comminuted fracture of shaft of left radius 10/29/2020  . Closed displaced comminuted fracture of shaft of left ulna 10/29/2020  . Displaced comminuted fracture of shaft of left femur, initial encounter for closed fracture (HCC) 10/29/2020  . Vitamin D insufficiency 10/29/2020  . 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 Closed displaced comminuted fracture of shaft of left humerus 10/27/2020  . Hypogonadism in male 08/15/2019  . Other male erectile dysfunction 11/08/2016  . Depression 10/05/2016  . Current every day smoker 10/05/2016  . Weight gain, abnormal 10/05/2016  . Generalized anxiety disorder 03/01/2016    4:42 PM, 12/16/20 M. Shary Decamp, PT, DPT Physical Therapist- [place] Office Number: [phone]  [facility] [address] [place], [place], [address] Phone: [phone]   Fax:  [phone]  Name: Jason Alexander MRN: [id number] Date of Birth: Mar 09, 1976

## 2020-12-16 NOTE — Patient Instructions (Signed)
Access Code: [email or web address] URL: [email or web address] Date: 12/16/2020 Prepared by: Shary Decamp  Exercises Supine Hip Flexion with Resistance Loop - 1 x daily - 7 x weekly - 3 sets - 10 reps Side Stepping with Resistance at Ankles - 1 x daily - 7 x weekly Standing Hamstring Curl with Resistance - 1 x daily - 7 x weekly - 3 sets - 10 reps Seated Knee Extension with Resistance - 1 x daily - 7 x weekly - 3 sets - 10 reps - 2 sec hold

## 2020-12-16 NOTE — Therapy (Addendum)
[place], [place], [address] Phone: [phone]   Fax:  [phone]  Occupational Therapy Treatment  Patient Details  Name: Jason Alexander MRN: [id number] Date of Birth: 03-Mar-1976 Referring Provider (OT): Lauraine Rinne, PA-C   Encounter Date: 12/16/2020   OT End of Session - 12/16/20 1817    Visit Number 4    Number of Visits 12    Date for OT Re-Evaluation 01/13/21    Authorization Type [facility] Medicaid    Authorization Time Period 27 visit limit combined PT/OT/SP; 3 visits approved from 12/04/20-01/04/21 (Requesting remaining 9 visits from [employer] on 12/16/20.)    Authorization - Visit Number 3    Authorization - Number of Visits 3    OT Start Time 1640    OT Stop Time 1800    OT Time Calculation (min) 80 min    Activity Tolerance Patient tolerated treatment well    Behavior During Therapy [facility] for tasks assessed/performed           Past Medical History:  Diagnosis Date  . Closed displaced comminuted fracture of shaft of left humerus 10/27/2020  . Closed displaced comminuted fracture of shaft of left radius 10/29/2020  . Closed displaced comminuted fracture of shaft of left ulna 10/29/2020  . Depression 2006   after divorce  . Displaced comminuted fracture of shaft of left femur, initial encounter for closed fracture ([place]) 10/29/2020  . 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 Vitamin D insufficiency 10/29/2020    Past Surgical History:  Procedure Laterality Date  . APPENDECTOMY    . FEMUR IM NAIL Left 10/28/2020   Procedure: INTRAMEDULLARY (IM) RETROGRADE FEMORAL NAILING;  Surgeon: Altamese [place], MD;  Location: [facility];  Service: Orthopedics;  Laterality: Left;  . NASAL SINUS SURGERY     polyp removal  . ORIF HUMERUS FRACTURE Left 10/28/2020   Procedure: OPEN REDUCTION INTERNAL FIXATION (ORIF) HUMERAL SHAFT FRACTURE;  Surgeon: Altamese [employer], MD;  Location: [facility];  Service: Orthopedics;   Laterality: Left;  . ORIF RADIAL FRACTURE Left 10/28/2020   Procedure: OPEN REDUCTION INTERNAL FIXATION (ORIF) RADIAL FRACTURE;  Surgeon: Altamese [place], MD;  Location: [facility];  Service: Orthopedics;  Laterality: Left;    There were no vitals filed for this visit.   Subjective Assessment - 12/16/20 1710    Subjective  S: It was sore when I came in today, but now it feels much better (after manual techniques).    Currently in Pain? Yes    Pain Score 3     Pain Location Arm   forearm   Pain Orientation Left    Pain Descriptors / Indicators Sore    Pain Type Chronic pain    Pain Radiating Towards None    Pain Onset 1 to 4 weeks ago    Pain Frequency Constant    Aggravating Factors  increased use    Pain Relieving Factors rest, retrograde massage    Effect of Pain on Daily Activities unable to utilize LUE for daily tasks.    Multiple Pain Sites No              [facility] OT Assessment - 12/16/20 1816      Assessment   Medical Diagnosis s/p left humerus ORIF, left ulna and radius ORIF, radius grafting, exploration of radial nerve      Precautions   Precautions Fall;Shoulder;Other (comment)    Type of Shoulder Precautions P/ROM, A/ROM; NO active abduction    Precaution Comments  Waiting to hear back from PA regarding updated precautions      Restrictions   Weight Bearing Restrictions Yes    LUE Weight Bearing Weight bearing as tolerated   through elbow   LLE Weight Bearing Weight bearing as tolerated              Quick Dash - 12/16/20 0001    Open a tight or new jar Unable    Do heavy household chores (wash walls, wash floors) Unable    Carry a shopping bag or briefcase Unable    Wash your back Unable    Use a knife to cut food Unable    Recreational activities in which you take some force or impact through your arm, shoulder, or hand (golf, hammering, tennis) Severe difficulty    During the past week, to what extent has your arm, shoulder or hand problem interfered with  your normal social activities with family, friends, neighbors, or groups? Quite a bit    During the past week, to what extent has your arm, shoulder or hand problem limited your work or other regular daily activities Extremely    Arm, shoulder, or hand pain. Severe   mainly in hand   Tingling (pins and needles) in your arm, shoulder, or hand Extreme   in hand   Difficulty Sleeping Severe difficulty    DASH Score 90.91 %                OT Treatments/Exercises (OP) - 12/16/20 1712      Exercises   Exercises Shoulder;Elbow;Wrist;Hand      Shoulder Exercises: Supine   Protraction PROM;5 reps;AROM;10 reps    Horizontal ABduction PROM;5 reps    External Rotation PROM;5 reps    Internal Rotation PROM;5 reps    Flexion PROM;5 reps    ABduction PROM;5 reps      Shoulder Exercises: Seated   Extension AROM;10 reps    Retraction AROM;10 reps    Row AROM;10 reps   hands clasped to maintain elbow flexion   Other Seated Exercises seated at table: er/IR 10X each    Other Seated Exercises Seated at table, washcloth, forward slide/shoulder flexion/no lean; 10X. Circles left/right 10X each direction, Arm extended; horizontal abduction/adduction 10X      Shoulder Exercises: Isometric Strengthening   Flexion Supine   3x10"   Extension Supine   3x10"   External Rotation Supine   3x10"   Internal Rotation Supine   3x10"   ABduction Supine   3x10"   ADduction Supine   3x10"     Elbow Exercises   Elbow Extension PROM;5 reps;AROM;10 reps    Forearm Supination PROM;AROM;10 reps   A/ROM performed seated   Forearm Pronation PROM;AROM;10 reps   A/ROMperformed seated   Other elbow exercises elbow flexion, P/ROM 10X; A/ROM with forearm pronated and elbow fully extended. Provided 1 finger active assist to complete elbow flexion to 90 degrees then patient completed remainder of movement 10X    Other elbow exercises elbow flexion isometrics with forearm pronated 3X10"      Wrist Exercises   Wrist  Flexion AROM;10 reps   gravity eliminated plane; seated   Wrist Extension PROM;AAROM;10 reps    Wrist Radial Deviation PROM;Self ROM;10 reps    Wrist Ulnar Deviation PROM;Self ROM;10 reps    Other wrist exercises wrist flexion/extension P/ROM, 5X      Manual Therapy   Manual Therapy Myofascial release;Edema management    Manual therapy comments completed separately from therapeutic  exercises    Edema Management retrograde massage to volar and dorsal forearm to decrease edema and improve mobility    Myofascial Release myofascial release to left forearm, upper arm, trapezius, and scapula to decrease pain and fascial restrictions and improve joint ROM    Kinesiotex --   Joint stabilization                   OT Short Term Goals - 12/09/20 0943      OT SHORT TERM GOAL #1   Title Pt will be provided with and educated on HEP to improve LUE use as non-dominant during ADL completion.    Time 3    Period Weeks    Status Met   Target Date 12/23/20      OT SHORT TERM GOAL #2   Title Pt will increase LUE P/ROM to [facility] to improve mobility throughout LUE required to perform dressing and bathing tasks using LUE as assist.    Time 3    Period Weeks    Status Met     OT SHORT TERM GOAL #3   Title Pt will decrease LUE fascial restrictions to moderate amount to improve mobility required for functional reaching tasks.    Time 3    Period Weeks    Status On-going      OT SHORT TERM GOAL #4   Title Pt will increase left elbow strength to 2/5 flexion and 3+/5 extension to improve ability to use LUE to assist during UB dressing tasks.    Time 3    Period Weeks    Status On-going      OT SHORT TERM GOAL #5   Title Pt will be provided with necessary splints and educated on wear and care of splints for LUE wrist stability and functional use.    Time 3    Period Weeks    Status On-going      OT SHORT TERM GOAL #6   Title Pt will be educated on and verbalize appropriate understanding and  utilization of sensory reintegration strategies.    Time 3    Period Weeks    Status Met            OT Long Term Goals - 12/09/20 0943      OT LONG TERM GOAL #1   Title Pt will decrease pain in LUE to 3/10 or less to improve ability to sleep for 3+ consecutive hours without waking due to pain.    Time 6    Period Weeks    Status On-going      OT LONG TERM GOAL #2   Title Pt will increase LUE A/ROM to [facility] to improve ability to use LUE as assist during eating and grooming tasks.    Time 6    Period Weeks    Status On-going      OT LONG TERM GOAL #3   Title Pt will increase LUE shoulder strength to 4/5 or greater to improve ability to participate in exercise routine.    Time 6    Period Weeks    Status On-going      OT LONG TERM GOAL #4   Title Pt will increase LUE elbow strength to 4-/5 flexion and 4/5 extension to improve ability to bring washcloth to face.    Time 6    Period Weeks    Status On-going      OT LONG TERM GOAL #5   Title Pt will increase LUE wrist  strength to 4/5 flexion and 3/5 extension to improve ability to turn items (keys, tops to jars, etc) during ADLs.    Time 6    Period Weeks    Status On-going      OT LONG TERM GOAL #6   Title Pt will increase LUE grip strength by 25# and pinch strength by 6# to improve ability to grasp and maintain hold on items during functional use.    Time 6    Period Weeks    Status On-going      OT LONG TERM GOAL #7   Title Pt will increase fine motor coordination by completing 9 hole peg test in under 30" to improve ability to manipulate small items during ADLs.    Time 6    Period Weeks    Status On-going                 Plan - 12/16/20 1817    Clinical Impression Statement A: Kinsiotape applied to left wrist to provide temporary joint stabilization for wrist extension. Pt with report of increased soreness in left forearm at start of session. After manual techniques and retrograde massage for edema  management, patient reports no pain/soreness. Continued to focus on increasing functional movement of LUE while in various positions (supine and seated). Provide joint support as needed to maximize performance during therapy exercises. VC for form and technique were provided. No fascial restrictions or muscle knots palpated in left upper trapezius and upper arm this session. Pt reports he has not called to schedule a follow up appointment with the orthopedic sugeon and plans to complete before Thursday's session.    Body Structure / Function / Physical Skills ADL;Endurance;UE functional use;Fascial restriction;Flexibility;Pain;FMC;ROM;Coordination;Sensation;IADL;Strength;Mobility;Edema    Plan P: Fabricate wrist cockup splint for wrist stabilization while working on fine motor tasks. Follow up on if MD follow up was scheduled.    Consulted and Agree with Plan of Care Patient           Patient will benefit from skilled therapeutic intervention in order to improve the following deficits and impairments:   Body Structure / Function / Physical Skills: ADL,Endurance,UE functional use,Fascial restriction,Flexibility,Pain,FMC,ROM,Coordination,Sensation,IADL,Strength,Mobility,Edema       Visit Diagnosis: Pain in left arm  Acute pain of left shoulder  Stiffness of left shoulder, not elsewhere classified  Other symptoms and signs involving the musculoskeletal system  Other symptoms and signs involving the nervous system  Other lack of coordination    Problem List Patient Active Problem List   Diagnosis Date Noted  . Critical polytrauma 11/15/2020  . Closed displaced comminuted fracture of shaft of left radius 10/29/2020  . Closed displaced comminuted fracture of shaft of left ulna 10/29/2020  . Displaced comminuted fracture of shaft of left femur, initial encounter for closed fracture ([place]) 10/29/2020  . Vitamin D insufficiency 10/29/2020  . 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 Closed  displaced comminuted fracture of shaft of left humerus 10/27/2020  . Hypogonadism in male 08/15/2019  . Other male erectile dysfunction 11/08/2016  . Depression 10/05/2016  . Current every day smoker 10/05/2016  . Weight gain, abnormal 10/05/2016  . Generalized anxiety disorder 03/01/2016   Ailene Ravel, OTR/L,CBIS  [phone]  12/16/2020, 6:30 PM  [place] [address] [place], [place], [address] Phone: [phone]   Fax:  [phone]  Name: Jason Alexander MRN: [id number] Date of Birth: November 20, 1976

## 2020-12-18 ENCOUNTER — Other Ambulatory Visit: Payer: Self-pay

## 2020-12-18 ENCOUNTER — Encounter (HOSPITAL_COMMUNITY): Payer: Self-pay | Admitting: Physical Therapy

## 2020-12-18 ENCOUNTER — Encounter (HOSPITAL_COMMUNITY): Payer: Self-pay | Admitting: Occupational Therapy

## 2020-12-18 ENCOUNTER — Ambulatory Visit (HOSPITAL_COMMUNITY): Payer: Medicaid Other | Admitting: Physical Therapy

## 2020-12-18 ENCOUNTER — Ambulatory Visit (HOSPITAL_COMMUNITY): Payer: Medicaid Other | Admitting: Occupational Therapy

## 2020-12-18 DIAGNOSIS — M25512 Pain in left shoulder: Secondary | ICD-10-CM

## 2020-12-18 DIAGNOSIS — R262 Difficulty in walking, not elsewhere classified: Secondary | ICD-10-CM

## 2020-12-18 DIAGNOSIS — M25612 Stiffness of left shoulder, not elsewhere classified: Secondary | ICD-10-CM

## 2020-12-18 DIAGNOSIS — R29898 Other symptoms and signs involving the musculoskeletal system: Secondary | ICD-10-CM

## 2020-12-18 DIAGNOSIS — R278 Other lack of coordination: Secondary | ICD-10-CM | POA: Diagnosis not present

## 2020-12-18 DIAGNOSIS — R29818 Other symptoms and signs involving the nervous system: Secondary | ICD-10-CM | POA: Diagnosis not present

## 2020-12-18 DIAGNOSIS — M79602 Pain in left arm: Secondary | ICD-10-CM

## 2020-12-18 DIAGNOSIS — R2681 Unsteadiness on feet: Secondary | ICD-10-CM

## 2020-12-18 DIAGNOSIS — M6281 Muscle weakness (generalized): Secondary | ICD-10-CM

## 2020-12-18 DIAGNOSIS — S72352A Displaced comminuted fracture of shaft of left femur, initial encounter for closed fracture: Secondary | ICD-10-CM | POA: Diagnosis not present

## 2020-12-18 NOTE — Therapy (Signed)
[facility] [address] [place], [place], [address] Phone: [phone]   Fax:  [phone]  Physical Therapy Treatment  Patient Details  Name: Jason Alexander MRN: [id number] Date of Birth: Jan 19, 1976 Referring Provider (PT): Montez Morita   Encounter Date: 12/18/2020   PT End of Session - 12/18/20 1358    Visit Number 5    Number of Visits 12    Date for PT Re-Evaluation 01/13/21    Authorization Type Medicaid [employer], request 12 visits, check auth. 12 visits approved    Authorization Time Period 12/02/20-01/31/21    Authorization - Visit Number 5    Authorization - Number of Visits 12    Progress Note Due on Visit 10    PT Start Time 1359    PT Stop Time 1440    PT Time Calculation (min) 41 min    Activity Tolerance Patient tolerated treatment well           Past Medical History:  Diagnosis Date  . Closed displaced comminuted fracture of shaft of left humerus 10/27/2020  . Closed displaced comminuted fracture of shaft of left radius 10/29/2020  . Closed displaced comminuted fracture of shaft of left ulna 10/29/2020  . Depression 2006   after divorce  . Displaced comminuted fracture of shaft of left femur, initial encounter for closed fracture (HCC) 10/29/2020  . 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 Vitamin D insufficiency 10/29/2020    Past Surgical History:  Procedure Laterality Date  . APPENDECTOMY    . FEMUR IM NAIL Left 10/28/2020   Procedure: INTRAMEDULLARY (IM) RETROGRADE FEMORAL NAILING;  Surgeon: Myrene Galas, MD;  Location: [facility] OR;  Service: Orthopedics;  Laterality: Left;  . NASAL SINUS SURGERY     polyp removal  . ORIF HUMERUS FRACTURE Left 10/28/2020   Procedure: OPEN REDUCTION INTERNAL FIXATION (ORIF) HUMERAL SHAFT FRACTURE;  Surgeon: Myrene Galas, MD;  Location: [facility] OR;  Service: Orthopedics;  Laterality: Left;  . ORIF RADIAL FRACTURE Left 10/28/2020   Procedure: OPEN REDUCTION INTERNAL FIXATION (ORIF)  RADIAL FRACTURE;  Surgeon: Myrene Galas, MD;  Location: [facility] OR;  Service: Orthopedics;  Laterality: Left;    There were no vitals filed for this visit.   Subjective Assessment - 12/18/20 1400    Subjective Patient states he has been doing great. his home exercises are going well.    Currently in Pain? Yes    Pain Score 1     Pain Location Leg    Pain Orientation Left    Pain Descriptors / Indicators Sore                             [facility] Adult PT Treatment/Exercise - 12/18/20 0001      Knee/Hip Exercises: Aerobic   Recumbent Bike 5 minutes for ROM and cardiovascular fitness      Knee/Hip Exercises: Machines for Strengthening   Cybex Leg Press 2x10 4 plates      Knee/Hip Exercises: Standing   Hip Flexion Both;2 sets;10 reps    Hip Flexion Limitations 5#    Side Lunges Both;10 reps;1 set    Lateral Step Up Left;2 sets;10 reps;Hand Hold: 1;Step Height: 6"    Forward Step Up Left;2 sets;10 reps;Hand Hold: 1;Step Height: 6"    Functional Squat 2 sets;10 reps    SLS with Vectors 5x 5 second holds  PT Short Term Goals - 12/16/20 1629      PT SHORT TERM GOAL #1   Title Patient will report at least 25% improvement in symptoms for improved quality of life.    Time 3    Period Weeks    Status New    Target Date 12/23/20      PT SHORT TERM GOAL #2   Title Patient will be independent with HEP in order to improve functional outcomes.    Baseline in development    Time 3    Period Weeks    Status New    Target Date 12/23/20      PT SHORT TERM GOAL #3   Title Patient will demonstrate left hip flexion AROM to 90 degrees to improve functional mobility    Baseline 65 degrees AAROM for flexion in supine    Time 3    Period Weeks    Status Achieved    Target Date 12/23/20             PT Long Term Goals - 12/02/20 1401      PT LONG TERM GOAL #1   Title Patient will report at least 50% improvement in overall symptoms and  function to demonstrate overall improved functional ability    Time 6    Period Weeks    Status New    Target Date 01/13/21      PT LONG TERM GOAL #2   Title Patient will demonstrate improved functional ambulation as evidenced by distance of 350 ft during using least restrictive AD    Baseline 110 ft with left platform RW    Time 6    Period Weeks    Status New    Target Date 01/13/21      PT LONG TERM GOAL #3   Title Patient will be able to navigate stairs with reciprocal pattern without compensation in order to demonstrate improved LE strength.    Baseline min A for 2 stairs, step-to with NBQC    Time 6    Period Weeks    Status New    Target Date 01/13/21                 Plan - 12/18/20 1359    Clinical Impression Statement Patient requires min verbal cueing with stair exercise mechanics as he gets distracted and will use RLE. Patient completes with unilateral UE support for balance with step up exercises secondary to impaired dynamic balance. He is showing improving hip flexion ROM with marching to approximately 90 degrees in standing. Patient requires frequent verbal cueing for lateral lunge mechanics for posterior weight shift with limited carry over. He requires unilateral UE for SLS with vectors with fatigue noted following. Patient given cueing with leg press for equal weight bearing through bilateral LE. Patient will continue to benefit from skilled physical therapy in order to reduce impairment and improve function.    Personal Factors and Comorbidities Behavior Pattern;Comorbidity 3+    Comorbidities left humerus fracture, radial nerve injury, left radius/ulna fracture s/p ORIF    Examination-Activity Limitations Bed Mobility;Bend;Carry;Dressing;Lift;Toileting;Stand;Stairs;Squat;Sit;Transfers;Locomotion Level    Examination-Participation Restrictions Cleaning;Community Activity;Driving;Laundry;Yard Work;Shop;Occupation;Meal Prep    Stability/Clinical Decision  Making Evolving/Moderate complexity    Rehab Potential Good    PT Frequency 2x / week    PT Duration 6 weeks    PT Treatment/Interventions ADLs/Self Care Home Management;Aquatic Therapy;Biofeedback;Cryotherapy;Electrical Stimulation;DME Instruction;Ultrasound;Traction;Moist Heat;Gait training;Stair training;Functional mobility training;Therapeutic activities;Therapeutic exercise;Balance training;Patient/family education;Neuromuscular re-education;Wheelchair mobility training;Manual techniques;Manual lymph drainage;Compression bandaging;Passive  range of motion;Taping;Splinting;Energy conservation;Dry needling;Spinal Manipulations;Joint Manipulations    PT Next Visit Plan Continue with LE ROM and PRE as tolerated. Possible use of harness/sky track for offloading ambulation. Progress bed exercises. continue to progress standing exercises and monitor/correct mechanics    PT Home Exercise Plan QS, heel slides, hip adduction, hip abduction isometric with belt, supine hip abduction with belt assist    Consulted and Agree with Plan of Care Patient;Family member/caregiver    Family Member Consulted patient's sister           Patient will benefit from skilled therapeutic intervention in order to improve the following deficits and impairments:  Abnormal gait,Decreased activity tolerance,Decreased balance,Decreased mobility,Decreased knowledge of use of DME,Decreased knowledge of precautions,Decreased endurance,Decreased coordination,Decreased range of motion,Decreased safety awareness,Decreased strength,Increased edema,Difficulty walking,Impaired perceived functional ability,Improper body mechanics,Impaired UE functional use,Pain  Visit Diagnosis: Difficulty in walking, not elsewhere classified  Muscle weakness (generalized)  Unsteadiness on feet  Displaced comminuted fracture of shaft of left femur, initial encounter for closed fracture [facility])     Problem List Patient Active Problem List    Diagnosis Date Noted  . Critical polytrauma 11/15/2020  . Closed displaced comminuted fracture of shaft of left radius 10/29/2020  . Closed displaced comminuted fracture of shaft of left ulna 10/29/2020  . Displaced comminuted fracture of shaft of left femur, initial encounter for closed fracture (HCC) 10/29/2020  . Vitamin D insufficiency 10/29/2020  . 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 Closed displaced comminuted fracture of shaft of left humerus 10/27/2020  . Hypogonadism in male 08/15/2019  . Other male erectile dysfunction 11/08/2016  . Depression 10/05/2016  . Current every day smoker 10/05/2016  . Weight gain, abnormal 10/05/2016  . Generalized anxiety disorder 03/01/2016    2:42 PM, 12/18/20 Wyman Songster PT, DPT Physical Therapist at [facility]   [facility] [address] [place], [place], [address] Phone: [phone]   Fax:  [phone]  Name: Rachid Parham MRN: [id number] Date of Birth: [birth year]

## 2020-12-18 NOTE — Therapy (Addendum)
[facility] [address] [place], [place], [address] Phone: [phone]   Fax:  [phone]  Occupational Therapy Treatment  Patient Details  Name: Jason Alexander MRN: [id number] Date of Birth: 1976-01-08 Referring Provider (OT): Mariam Dollar, PA-C   Encounter Date: 12/18/2020   OT End of Session - 12/18/20 1632    Visit Number 5    Number of Visits 12    Date for OT Re-Evaluation 01/13/21    Authorization Type [employer]    Authorization Time Period 27 visit limit combined PT/OT/SP; 27 visit limit combined PT/OT/SP; 9 visits approved 12/04/20-02/02/21   Authorization - Visit Number 1    Authorization - Number of Visits 9    OT Start Time 1515    OT Stop Time 1615    OT Time Calculation (min) 60 min    Activity Tolerance Patient tolerated treatment well    Behavior During Therapy [facility] for tasks assessed/performed           Past Medical History:  Diagnosis Date  . Closed displaced comminuted fracture of shaft of left humerus 10/27/2020  . Closed displaced comminuted fracture of shaft of left radius 10/29/2020  . Closed displaced comminuted fracture of shaft of left ulna 10/29/2020  . Depression 2006   after divorce  . Displaced comminuted fracture of shaft of left femur, initial encounter for closed fracture (HCC) 10/29/2020  . 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 Vitamin D insufficiency 10/29/2020    Past Surgical History:  Procedure Laterality Date  . APPENDECTOMY    . FEMUR IM NAIL Left 10/28/2020   Procedure: INTRAMEDULLARY (IM) RETROGRADE FEMORAL NAILING;  Surgeon: Myrene Galas, MD;  [facility];  Service: Orthopedics;  Laterality: Left;  . NASAL SINUS SURGERY     polyp removal  . ORIF HUMERUS FRACTURE Left 10/28/2020   Procedure: OPEN REDUCTION INTERNAL FIXATION (ORIF) HUMERAL SHAFT FRACTURE;  Surgeon: Myrene Galas, MD;  Location: [facility] OR;  Service: Orthopedics;  Laterality: Left;  . ORIF RADIAL  FRACTURE Left 10/28/2020   Procedure: OPEN REDUCTION INTERNAL FIXATION (ORIF) RADIAL FRACTURE;  Surgeon: Myrene Galas, MD;  Location: [facility] OR;  Service: Orthopedics;  Laterality: Left;    There were no vitals filed for this visit.   Subjective Assessment - 12/18/20 1510    Subjective  S: I just wish this thumb would wake up.    Currently in Pain? No/denies              [facility] OT Assessment - 12/18/20 1509      Assessment   Medical Diagnosis s/p left humerus ORIF, left ulna and radius ORIF, radius grafting, exploration of radial nerve      Precautions   Precautions Fall;Shoulder;Other (comment)    Type of Shoulder Precautions P/ROM, A/ROM; NO active abduction    Precaution Comments Pt to make appt with ortho MD to follow up and get updated precautions      Restrictions   Weight Bearing Restrictions Yes    LUE Weight Bearing Weight bearing as tolerated   through elbow   LLE Weight Bearing Weight bearing as tolerated                    OT Treatments/Exercises (OP) - 12/18/20 1517      Exercises   Exercises Shoulder;Elbow;Wrist;Hand      Shoulder Exercises: Supine   Protraction PROM;5 reps;AROM;10 reps    Horizontal ABduction PROM;5 reps    External Rotation PROM;5 reps  Internal Rotation PROM;5 reps    Flexion PROM;5 reps;AROM;10 reps    ABduction PROM;5 reps      Hand Exercises   Other Hand Exercises composite digit flexion 10X    Other Hand Exercises Pt placing 10 large pegs into pegboard, manevering pegs once holding in fingers. Pt then removed with min difficulty, noting to extend digits to 90 degrees of MCP extension during task      Splinting   Splinting volar wrist cock-up splint fabricated for wrist support and stabilization during functional tasks. Fabricated with wrist at approximately 10 degrees extension      Manual Therapy   Manual Therapy Myofascial release;Edema management    Manual therapy comments completed separately from therapeutic  exercises    Edema Management retrograde massage to volar and dorsal forearm to decrease edema and improve mobility    Myofascial Release myofascial release to left forearm, upper arm, trapezius, and scapula to decrease pain and fascial restrictions and improve joint ROM      Fine Motor Coordination (Hand/Wrist)   Fine Motor Coordination In hand manipuation training    In Hand Manipulation Training Pt using 3 point pinch to grasp and stack 3 stacks of 4 sponges, able to activate bicep flexion to lift arm for stacking with forearm pronated                    OT Short Term Goals - 12/18/20 1212      OT SHORT TERM GOAL #1   Title Pt will be provided with and educated on HEP to improve LUE use as non-dominant during ADL completion.    Time 3    Period Weeks    Status Achieved    Target Date 12/23/20      OT SHORT TERM GOAL #2   Title Pt will increase LUE P/ROM to [facility] to improve mobility throughout LUE required to perform dressing and bathing tasks using LUE as assist.    Time 3    Period Weeks    Status Achieved      OT SHORT TERM GOAL #3   Title Pt will decrease LUE fascial restrictions to moderate amount to improve mobility required for functional reaching tasks.    Time 3    Period Weeks    Status On-going      OT SHORT TERM GOAL #4   Title Pt will increase left elbow strength to 2/5 flexion and 3+/5 extension to improve ability to use LUE to assist during UB dressing tasks.    Time 3    Period Weeks    Status On-going      OT SHORT TERM GOAL #5   Title Pt will be provided with necessary splints and educated on wear and care of splints for LUE wrist stability and functional use.    Time 3    Period Weeks    Status On-going      OT SHORT TERM GOAL #6   Title Pt will be educated on and verbalize appropriate understanding and utilization of sensory reintegration strategies.    Time 3    Period Weeks    Status Achieved             OT Long Term Goals -  12/09/20 0943      OT LONG TERM GOAL #1   Title Pt will decrease pain in LUE to 3/10 or less to improve ability to sleep for 3+ consecutive hours without waking due to pain.  Time 6    Period Weeks    Status On-going      OT LONG TERM GOAL #2   Title Pt will increase LUE A/ROM to [facility] to improve ability to use LUE as assist during eating and grooming tasks.    Time 6    Period Weeks    Status On-going      OT LONG TERM GOAL #3   Title Pt will increase LUE shoulder strength to 4/5 or greater to improve ability to participate in exercise routine.    Time 6    Period Weeks    Status On-going      OT LONG TERM GOAL #4   Title Pt will increase LUE elbow strength to 4-/5 flexion and 4/5 extension to improve ability to bring washcloth to face.    Time 6    Period Weeks    Status On-going      OT LONG TERM GOAL #5   Title Pt will increase LUE wrist strength to 4/5 flexion and 3/5 extension to improve ability to turn items (keys, tops to jars, etc) during ADLs.    Time 6    Period Weeks    Status On-going      OT LONG TERM GOAL #6   Title Pt will increase LUE grip strength by 25# and pinch strength by 6# to improve ability to grasp and maintain hold on items during functional use.    Time 6    Period Weeks    Status On-going      OT LONG TERM GOAL #7   Title Pt will increase fine motor coordination by completing 9 hole peg test in under 30" to improve ability to manipulate small items during ADLs.    Time 6    Period Weeks    Status On-going                 Plan - 12/18/20 1633    Clinical Impression Statement A: Insurance denied additional visits, request resubmitted with additional information and waiting on approval. Therefore pt seen as no charge visit today. Pt reports kinesiotape has improved wrist stabilization and he has not worn his wrist brace since yesterday. Continued with manual techniques this session, passive stretching, and A/ROM. Pt continues to improve  bicep activation with forearm pronated. Improvement in ability to actively extend digits-still limited to 90 degrees. Volar wrist cock-up splint fabricated this session to assist with wrist stabilization required for fine motor tasks. Pt was then able to complete 2 fine motor tasks while simultaneously engaging bicep. Verbal cuing for form and technique.    Body Structure / Function / Physical Skills ADL;Endurance;UE functional use;Fascial restriction;Flexibility;Pain;FMC;ROM;Coordination;Sensation;IADL;Strength;Mobility;Edema    Plan P: Follow up on MD scheduling ortho follow up. Continue with fine motor tasks using wrist cock-up splint for support.    Consulted and Agree with Plan of Care Patient           Patient will benefit from skilled therapeutic intervention in order to improve the following deficits and impairments:   Body Structure / Function / Physical Skills: ADL,Endurance,UE functional use,Fascial restriction,Flexibility,Pain,FMC,ROM,Coordination,Sensation,IADL,Strength,Mobility,Edema       Visit Diagnosis: Pain in left arm  Acute pain of left shoulder  Stiffness of left shoulder, not elsewhere classified  Other symptoms and signs involving the musculoskeletal system  Other lack of coordination  Other symptoms and signs involving the nervous system    Problem List Patient Active Problem List   Diagnosis Date Noted  . Critical  polytrauma 11/15/2020  . Closed displaced comminuted fracture of shaft of left radius 10/29/2020  . Closed displaced comminuted fracture of shaft of left ulna 10/29/2020  . Displaced comminuted fracture of shaft of left femur, initial encounter for closed fracture (HCC) 10/29/2020  . Vitamin D insufficiency 10/29/2020  . 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 Closed displaced comminuted fracture of shaft of left humerus 10/27/2020  . Hypogonadism in male 08/15/2019  . Other male erectile dysfunction 11/08/2016  . Depression 10/05/2016  .  Current every day smoker 10/05/2016  . Weight gain, abnormal 10/05/2016  . Generalized anxiety disorder 03/01/2016   Ezra Sites, OTR/L  [phone] 12/18/2020, 4:36 PM  [place] [facility] [address] [place], [place], [address] Phone: [phone]   Fax:  [phone]  Name: Jason Alexander MRN: [id number] Date of Birth: 10/30/1976

## 2020-12-22 ENCOUNTER — Encounter (HOSPITAL_COMMUNITY): Payer: Self-pay

## 2020-12-22 ENCOUNTER — Ambulatory Visit (HOSPITAL_COMMUNITY): Payer: Medicaid Other

## 2020-12-24 ENCOUNTER — Encounter (HOSPITAL_COMMUNITY): Payer: Self-pay

## 2020-12-24 ENCOUNTER — Other Ambulatory Visit: Payer: Self-pay

## 2020-12-24 ENCOUNTER — Ambulatory Visit (HOSPITAL_COMMUNITY): Payer: Medicaid Other

## 2020-12-24 DIAGNOSIS — R2681 Unsteadiness on feet: Secondary | ICD-10-CM

## 2020-12-24 DIAGNOSIS — R29898 Other symptoms and signs involving the musculoskeletal system: Secondary | ICD-10-CM | POA: Diagnosis not present

## 2020-12-24 DIAGNOSIS — S72352D Displaced comminuted fracture of shaft of left femur, subsequent encounter for closed fracture with routine healing: Secondary | ICD-10-CM | POA: Diagnosis not present

## 2020-12-24 DIAGNOSIS — M25612 Stiffness of left shoulder, not elsewhere classified: Secondary | ICD-10-CM | POA: Diagnosis not present

## 2020-12-24 DIAGNOSIS — R29818 Other symptoms and signs involving the nervous system: Secondary | ICD-10-CM

## 2020-12-24 DIAGNOSIS — S72352A Displaced comminuted fracture of shaft of left femur, initial encounter for closed fracture: Secondary | ICD-10-CM

## 2020-12-24 DIAGNOSIS — M6281 Muscle weakness (generalized): Secondary | ICD-10-CM

## 2020-12-24 DIAGNOSIS — R262 Difficulty in walking, not elsewhere classified: Secondary | ICD-10-CM

## 2020-12-24 DIAGNOSIS — R278 Other lack of coordination: Secondary | ICD-10-CM | POA: Diagnosis not present

## 2020-12-24 DIAGNOSIS — S52252D Displaced comminuted fracture of shaft of ulna, left arm, subsequent encounter for closed fracture with routine healing: Secondary | ICD-10-CM | POA: Diagnosis not present

## 2020-12-24 DIAGNOSIS — S52352D Displaced comminuted fracture of shaft of radius, left arm, subsequent encounter for closed fracture with routine healing: Secondary | ICD-10-CM | POA: Diagnosis not present

## 2020-12-24 DIAGNOSIS — M79602 Pain in left arm: Secondary | ICD-10-CM

## 2020-12-24 DIAGNOSIS — M25512 Pain in left shoulder: Secondary | ICD-10-CM

## 2020-12-24 DIAGNOSIS — S42352D Displaced comminuted fracture of shaft of humerus, left arm, subsequent encounter for fracture with routine healing: Secondary | ICD-10-CM | POA: Diagnosis not present

## 2020-12-24 NOTE — Therapy (Signed)
[facility] [address] [place], [place], [address] Phone: [phone]   Fax:  [phone]  Occupational Therapy Treatment  Patient Details  Name: Jason MurphyRichard Alexander MRN: [id number] Date of Birth: May 17, 1976 Referring Provider (OT): Mariam Dollaraniel Angiulli, PA-C   Encounter Date: 12/24/2020   OT End of Session - 12/24/20 1834    Visit Number 6    Number of Visits 12    Date for OT Re-Evaluation 01/13/21    Authorization Type [employer]    Authorization Time Period 27 visit limit combined PT/OT/SP; 9 visits approved 12/04/20-02/02/21    Authorization - Visit Number 1    Authorization - Number of Visits 9    OT Start Time 1600    OT Stop Time 1715    OT Time Calculation (min) 75 min    Activity Tolerance Patient tolerated treatment well    Behavior During Therapy [facility] for tasks assessed/performed           Past Medical History:  Diagnosis Date  . Closed displaced comminuted fracture of shaft of left humerus 10/27/2020  . Closed displaced comminuted fracture of shaft of left radius 10/29/2020  . Closed displaced comminuted fracture of shaft of left ulna 10/29/2020  . Depression 2006   after divorce  . Displaced comminuted fracture of shaft of left femur, initial encounter for closed fracture (HCC) 10/29/2020  . 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 Vitamin D insufficiency 10/29/2020    Past Surgical History:  Procedure Laterality Date  . APPENDECTOMY    . FEMUR IM NAIL Left 10/28/2020   Procedure: INTRAMEDULLARY (IM) RETROGRADE FEMORAL NAILING;  Surgeon: Myrene GalasHandy, Michael, MD;  Location: [facility] OR;  Service: Orthopedics;  Laterality: Left;  . NASAL SINUS SURGERY     polyp removal  . ORIF HUMERUS FRACTURE Left 10/28/2020   Procedure: OPEN REDUCTION INTERNAL FIXATION (ORIF) HUMERAL SHAFT FRACTURE;  Surgeon: Myrene GalasHandy, Michael, MD;  Location: [facility] OR;  Service: Orthopedics;  Laterality: Left;  . ORIF RADIAL FRACTURE Left 10/28/2020   Procedure:  OPEN REDUCTION INTERNAL FIXATION (ORIF) RADIAL FRACTURE;  Surgeon: Myrene GalasHandy, Michael, MD;  Location: [facility] OR;  Service: Orthopedics;  Laterality: Left;    There were no vitals filed for this visit.   Subjective Assessment - 12/24/20 1616    Subjective  S: I had my follow up visit with the orthopedic MD today.    Currently in Pain? No/denies              [facility] Assessment - 12/24/20 1617      Assessment   Medical Diagnosis s/p left humerus ORIF, left ulna and radius ORIF, radius grafting, exploration of radial nerve    Next MD Visit 6 weeks      Precautions   Precautions Fall    Type of Shoulder Precautions Progress as tolerated. No orthopedic precautions    Required Braces or Orthoses Other Brace/Splint    Other Brace/Splint Wrist cock up splint to be worn during fine motor tasks to stabilize wrist.                    OT Treatments/Exercises (OP) - 12/24/20 1620      Exercises   Exercises Shoulder;Elbow;Wrist;Hand;Theraputty      Shoulder Exercises: Seated   Other Seated Exercises Seated at table, washcloth, forward slide/shoulder flexion/no lean; 12X. Circles left/right 12X each direction, Arm extended; horizontal abduction/adduction 12X      Elbow Exercises   Other elbow exercises Elbow flexion and extential  with pronated forearm 2X      Additional Elbow Exercises   Theraputty - Roll yellow      Theraputty   Theraputty - Pinch yellow -2 point index and thumb      Neurological Re-education Exercises   Other Exercises 1 Kinsiotape applied to left UE to faciliate elbow flexion during functional tasks. Once Y strip applied with origin placed at posterior deltoid and insertion of strip at distal anterior elbow.    Development of Reach Reaching    Reaching to Shoulder Height Motor control and functional reaching task complete while using high resistance sponges. Picked up on sponge at a time and placed on elevated surface to create a tower of 2 sponges. Elbow  extended during task.      Functional Reaching Activities   Mid Level Resistive clothespins used (all colors) to focus on pinch strength and mid level functional reaching with elbow extended. All pins placed and removed with rest breaks as needed.      Splinting   Splinting Adjustment made to wrist cock up splint due to pressure area noted on dorsal aspect of hand. Smoothed all edges of splint. Applied soft 1/2 inch foam to MCP area and support to dorsal aspect of hand. Longer strap applied to dorsal support. Four sockettes provided.      Manual Therapy   Manual Therapy Taping    Kinesiotex Facilitate Muscle                    OT Short Term Goals - 12/18/20 1212      OT SHORT TERM GOAL #1   Title Pt will be provided with and educated on HEP to improve LUE use as non-dominant during ADL completion.    Time 3    Period Weeks    Status Achieved    Target Date 12/23/20      OT SHORT TERM GOAL #2   Title Pt will increase LUE P/ROM to [facility] to improve mobility throughout LUE required to perform dressing and bathing tasks using LUE as assist.    Time 3    Period Weeks    Status Achieved      OT SHORT TERM GOAL #3   Title Pt will decrease LUE fascial restrictions to moderate amount to improve mobility required for functional reaching tasks.    Time 3    Period Weeks    Status On-going      OT SHORT TERM GOAL #4   Title Pt will increase left elbow strength to 2/5 flexion and 3+/5 extension to improve ability to use LUE to assist during UB dressing tasks.    Time 3    Period Weeks    Status On-going      OT SHORT TERM GOAL #5   Title Pt will be provided with necessary splints and educated on wear and care of splints for LUE wrist stability and functional use.    Time 3    Period Weeks    Status On-going      OT SHORT TERM GOAL #6   Title Pt will be educated on and verbalize appropriate understanding and utilization of sensory reintegration strategies.    Time 3     Period Weeks    Status Achieved             OT Long Term Goals - 12/09/20 0943      OT LONG TERM GOAL #1   Title Pt will decrease pain in LUE to  3/10 or less to improve ability to sleep for 3+ consecutive hours without waking due to pain.    Time 6    Period Weeks    Status On-going      OT LONG TERM GOAL #2   Title Pt will increase LUE A/ROM to [facility] to improve ability to use LUE as assist during eating and grooming tasks.    Time 6    Period Weeks    Status On-going      OT LONG TERM GOAL #3   Title Pt will increase LUE shoulder strength to 4/5 or greater to improve ability to participate in exercise routine.    Time 6    Period Weeks    Status On-going      OT LONG TERM GOAL #4   Title Pt will increase LUE elbow strength to 4-/5 flexion and 4/5 extension to improve ability to bring washcloth to face.    Time 6    Period Weeks    Status On-going      OT LONG TERM GOAL #5   Title Pt will increase LUE wrist strength to 4/5 flexion and 3/5 extension to improve ability to turn items (keys, tops to jars, etc) during ADLs.    Time 6    Period Weeks    Status On-going      OT LONG TERM GOAL #6   Title Pt will increase LUE grip strength by 25# and pinch strength by 6# to improve ability to grasp and maintain hold on items during functional use.    Time 6    Period Weeks    Status On-going      OT LONG TERM GOAL #7   Title Pt will increase fine motor coordination by completing 9 hole peg test in under 30" to improve ability to manipulate small items during ADLs.    Time 6    Period Weeks    Status On-going                 Plan - 12/24/20 1836    Clinical Impression Statement A: Focused on motor control while seated at table with wrist supported in brace. Demonstrated muscle fatigue during tasks due to weakness while taking rest breaks as needed. Demonstrated improvement with pinch strength and ability while utilizing all resistive clothespins during activity.  Adjustments made to splint due to skin irritation/pressure areas. Additional padding  and sockettes were provided. VC for form and technique provided during all activities. Kinesiotape placed to trial faciliation of elbow flexion due to limited ability to performed actively. Pt reports feeling tension in tricep when attempting to perform elbow flexion. Fatigues quickely after 2 repetitions of elbow flexion with forearm pronated.    Body Structure / Function / Physical Skills ADL;Endurance;UE functional use;Fascial restriction;Flexibility;Pain;FMC;ROM;Coordination;Sensation;IADL;Strength;Mobility;Edema    Plan P: Check on splint after adjustments last session. Provide any additional adjustments if needed. Continue with motor control tasks with splint on for wrist support. Continue with kinsiotape use for elbow flexion facilitation if it shows improvement. Attempt squigz placement on door for functional reach and grip strength with removal if able.    Consulted and Agree with Plan of Care Patient           Patient will benefit from skilled therapeutic intervention in order to improve the following deficits and impairments:   Body Structure / Function / Physical Skills: ADL,Endurance,UE functional use,Fascial restriction,Flexibility,Pain,FMC,ROM,Coordination,Sensation,IADL,Strength,Mobility,Edema       Visit Diagnosis: Pain in left arm  Acute pain  of left shoulder  Other symptoms and signs involving the musculoskeletal system  Stiffness of left shoulder, not elsewhere classified  Other lack of coordination  Other symptoms and signs involving the nervous system    Problem List Patient Active Problem List   Diagnosis Date Noted  . Critical polytrauma 11/15/2020  . Closed displaced comminuted fracture of shaft of left radius 10/29/2020  . Closed displaced comminuted fracture of shaft of left ulna 10/29/2020  . Displaced comminuted fracture of shaft of left femur, initial encounter for  closed fracture (HCC) 10/29/2020  . Vitamin D insufficiency 10/29/2020  . 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 Closed displaced comminuted fracture of shaft of left humerus 10/27/2020  . Hypogonadism in male 08/15/2019  . Other male erectile dysfunction 11/08/2016  . Depression 10/05/2016  . Current every day smoker 10/05/2016  . Weight gain, abnormal 10/05/2016  . Generalized anxiety disorder 03/01/2016   Limmie Patricia, OTR/L,CBIS  [phone]  12/24/2020, 6:43 PM  [facility] [address] [place], [place], [address] Phone: [phone]   Fax:  [phone]  Name: Jason Alexander MRN: [id number] Date of Birth: 1976/07/04

## 2020-12-24 NOTE — Therapy (Signed)
[facility] [address] [place], [place], [address] Phone: [phone]   Fax:  [phone]  Physical Therapy Treatment  Patient Details  Name: Jason Alexander MRN: [id number] Date of Birth: Mar 23, 1976 Referring Provider (PT): Montez Morita   Encounter Date: 12/24/2020   PT End of Session - 12/24/20 1606    Visit Number 6    Number of Visits 12    Date for PT Re-Evaluation 01/13/21    Authorization Type Medicaid [employer], request 12 visits, check auth. 12 visits approved    Authorization Time Period 12/02/20-01/31/21    Authorization - Visit Number 6    Authorization - Number of Visits 12    Progress Note Due on Visit 10    PT Start Time 1515    PT Stop Time 1600    PT Time Calculation (min) 45 min    Activity Tolerance Patient tolerated treatment well           Past Medical History:  Diagnosis Date  . Closed displaced comminuted fracture of shaft of left humerus 10/27/2020  . Closed displaced comminuted fracture of shaft of left radius 10/29/2020  . Closed displaced comminuted fracture of shaft of left ulna 10/29/2020  . Depression 2006   after divorce  . Displaced comminuted fracture of shaft of left femur, initial encounter for closed fracture (HCC) 10/29/2020  . 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 Vitamin D insufficiency 10/29/2020    Past Surgical History:  Procedure Laterality Date  . APPENDECTOMY    . FEMUR IM NAIL Left 10/28/2020   Procedure: INTRAMEDULLARY (IM) RETROGRADE FEMORAL NAILING;  Surgeon: Myrene Galas, MD;  Location: [facility] OR;  Service: Orthopedics;  Laterality: Left;  . NASAL SINUS SURGERY     polyp removal  . ORIF HUMERUS FRACTURE Left 10/28/2020   Procedure: OPEN REDUCTION INTERNAL FIXATION (ORIF) HUMERAL SHAFT FRACTURE;  Surgeon: Myrene Galas, MD;  Location: [facility] OR;  Service: Orthopedics;  Laterality: Left;  . ORIF RADIAL FRACTURE Left 10/28/2020   Procedure: OPEN REDUCTION INTERNAL FIXATION (ORIF)  RADIAL FRACTURE;  Surgeon: Myrene Galas, MD;  Location: [facility] OR;  Service: Orthopedics;  Laterality: Left;    There were no vitals filed for this visit.   Subjective Assessment - 12/24/20 1527    Subjective Patient reports good check up with MD today and reports good bony healing in femur.  Patient has been dutifully performing HEP for LE strengthening and continues to ambulate with NBQC                             [facility] Adult PT Treatment/Exercise - 12/24/20 0001      Ambulation/Gait   Ambulation/Gait Yes    Ambulation/Gait Assistance 6: Modified independent (Device/Increase time)    Gait Comments retrowalking for hip/knee extension 3x40 ft      Knee/Hip Exercises: Stretches   Gastroc Stretch Both;2 reps;60 seconds      Knee/Hip Exercises: Standing   Knee Flexion Strengthening;Both   6x10 with 10 lbs weights   Hip Flexion Stengthening;Both   6" stair taps 2x2 min   Rocker Board 2 minutes   lateral, anterior-posterior   Other Standing Knee Exercises sidestepping 2x2 min with 10 lbs ankle weights                  PT Education - 12/24/20 1600    Education Details discussion regarding POC details and progress with STG/LTG    Person(s) Educated  Patient    Methods Explanation    Comprehension Verbalized understanding            PT Short Term Goals - 12/16/20 1629      PT SHORT TERM GOAL #1   Title Patient will report at least 25% improvement in symptoms for improved quality of life.    Time 3    Period Weeks    Status New    Target Date 12/23/20      PT SHORT TERM GOAL #2   Title Patient will be independent with HEP in order to improve functional outcomes.    Baseline in development    Time 3    Period Weeks    Status New    Target Date 12/23/20      PT SHORT TERM GOAL #3   Title Patient will demonstrate left hip flexion AROM to 90 degrees to improve functional mobility    Baseline 65 degrees AAROM for flexion in supine    Time 3     Period Weeks    Status Achieved    Target Date 12/23/20             PT Long Term Goals - 12/02/20 1401      PT LONG TERM GOAL #1   Title Patient will report at least 50% improvement in overall symptoms and function to demonstrate overall improved functional ability    Time 6    Period Weeks    Status New    Target Date 01/13/21      PT LONG TERM GOAL #2   Title Patient will demonstrate improved functional ambulation as evidenced by distance of 350 ft during using least restrictive AD    Baseline 110 ft with left platform RW    Time 6    Period Weeks    Status New    Target Date 01/13/21      PT LONG TERM GOAL #3   Title Patient will be able to navigate stairs with reciprocal pattern without compensation in order to demonstrate improved LE strength.    Baseline min A for 2 stairs, step-to with NBQC    Time 6    Period Weeks    Status New    Target Date 01/13/21                 Plan - 12/24/20 1607    Clinical Impression Statement Demonstrates improved step length during gait cycle with improved LLE weight acceptance and hip extension appreciated throughout.  Tolerating increased resistance loads with PRE without difficulty or c/o increased pain.  Increased activity tolerance evident by decreased need for therapeutic rest periods or activity modification during therapy tasks.  Asymmetry noted during rocker board balance with difficulty maintaining static posture requiring near constant postural adjustment to maintain COG    Personal Factors and Comorbidities Behavior Pattern;Comorbidity 3+    Comorbidities left humerus fracture, radial nerve injury, left radius/ulna fracture s/p ORIF    Examination-Activity Limitations Bed Mobility;Bend;Carry;Dressing;Lift;Toileting;Stand;Stairs;Squat;Sit;Transfers;Locomotion Level    Examination-Participation Restrictions Cleaning;Community Activity;Driving;Laundry;Yard Work;Shop;Occupation;Meal Prep    Stability/Clinical Decision  Making Evolving/Moderate complexity    Rehab Potential Good    PT Frequency 2x / week    PT Duration 6 weeks    PT Treatment/Interventions ADLs/Self Care Home Management;Aquatic Therapy;Biofeedback;Cryotherapy;Electrical Stimulation;DME Instruction;Ultrasound;Traction;Moist Heat;Gait training;Stair training;Functional mobility training;Therapeutic activities;Therapeutic exercise;Balance training;Patient/family education;Neuromuscular re-education;Wheelchair mobility training;Manual techniques;Manual lymph drainage;Compression bandaging;Passive range of motion;Taping;Splinting;Energy conservation;Dry needling;Spinal Manipulations;Joint Manipulations    PT Next Visit Plan Continue with LE  ROM and PRE as tolerated. Possible use of harness/sky track for offloading ambulation. Progress bed exercises. continue to progress standing exercises and monitor/correct mechanics    PT Home Exercise Plan QS, heel slides, hip adduction, hip abduction isometric with belt, supine hip abduction with belt assist    Consulted and Agree with Plan of Care Patient;Family member/caregiver    Family Member Consulted patient's sister           Patient will benefit from skilled therapeutic intervention in order to improve the following deficits and impairments:  Abnormal gait,Decreased activity tolerance,Decreased balance,Decreased mobility,Decreased knowledge of use of DME,Decreased knowledge of precautions,Decreased endurance,Decreased coordination,Decreased range of motion,Decreased safety awareness,Decreased strength,Increased edema,Difficulty walking,Impaired perceived functional ability,Improper body mechanics,Impaired UE functional use,Pain  Visit Diagnosis: Difficulty in walking, not elsewhere classified  Muscle weakness (generalized)  Unsteadiness on feet  Displaced comminuted fracture of shaft of left femur, initial encounter for closed fracture [facility])     Problem List Patient Active Problem List    Diagnosis Date Noted  . Critical polytrauma 11/15/2020  . Closed displaced comminuted fracture of shaft of left radius 10/29/2020  . Closed displaced comminuted fracture of shaft of left ulna 10/29/2020  . Displaced comminuted fracture of shaft of left femur, initial encounter for closed fracture (HCC) 10/29/2020  . Vitamin D insufficiency 10/29/2020  . 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 Closed displaced comminuted fracture of shaft of left humerus 10/27/2020  . Hypogonadism in male 08/15/2019  . Other male erectile dysfunction 11/08/2016  . Depression 10/05/2016  . Current every day smoker 10/05/2016  . Weight gain, abnormal 10/05/2016  . Generalized anxiety disorder 03/01/2016    4:10 PM, 12/24/20 M. Shary Decamp, PT, DPT Physical Therapist- [place] Office Number: [phone]  [facility] [address] [place], [place], [address] Phone: [phone]   Fax:  [phone]  Name: Otilio Groleau MRN: [id number] Date of Birth: 09-Jul-1976

## 2020-12-25 ENCOUNTER — Telehealth (HOSPITAL_COMMUNITY): Payer: Self-pay | Admitting: *Deleted

## 2020-12-25 NOTE — Telephone Encounter (Signed)
Patient called stating he was verifying his appt with [name]. Staff informed him with his appt time and date. Per pt he has been out of his medications for the past several months now and would like refills on his scripts. Staff informed patient that office do not currently have a provider that treats adults that prescribe medications. Pt then asked, then who is [name]. Staff informed him with Terry's role. Patient then stated well the last time I seen him, he was under the impression that Aurther Loft was going to be the one that fills his prescriptions and not just counseling him. Staff then stated Terry's position. Patient then asked, then what am I suppose to do now? Staff then suggested that patient call his PCP for a referral or call his insurance for names of providers that can service him. Right after staff stated that, patient said something very low and then hung up on staff before staff could say anything else.

## 2020-12-26 ENCOUNTER — Ambulatory Visit (HOSPITAL_COMMUNITY): Payer: Medicaid Other | Admitting: Clinical

## 2020-12-26 ENCOUNTER — Other Ambulatory Visit: Payer: Self-pay

## 2020-12-26 ENCOUNTER — Telehealth (HOSPITAL_COMMUNITY): Payer: Self-pay | Admitting: Clinical

## 2020-12-26 NOTE — Telephone Encounter (Signed)
Pt did not respond to video link, phone call, or VM 

## 2020-12-30 ENCOUNTER — Ambulatory Visit (HOSPITAL_COMMUNITY): Payer: Medicaid Other

## 2020-12-30 ENCOUNTER — Ambulatory Visit (HOSPITAL_COMMUNITY): Payer: Medicaid Other | Admitting: Occupational Therapy

## 2020-12-30 ENCOUNTER — Other Ambulatory Visit: Payer: Self-pay

## 2020-12-30 ENCOUNTER — Encounter (HOSPITAL_COMMUNITY): Payer: Self-pay | Admitting: Occupational Therapy

## 2020-12-30 DIAGNOSIS — R29898 Other symptoms and signs involving the musculoskeletal system: Secondary | ICD-10-CM | POA: Diagnosis not present

## 2020-12-30 DIAGNOSIS — M25612 Stiffness of left shoulder, not elsewhere classified: Secondary | ICD-10-CM | POA: Diagnosis not present

## 2020-12-30 DIAGNOSIS — R278 Other lack of coordination: Secondary | ICD-10-CM

## 2020-12-30 DIAGNOSIS — R262 Difficulty in walking, not elsewhere classified: Secondary | ICD-10-CM

## 2020-12-30 DIAGNOSIS — M6281 Muscle weakness (generalized): Secondary | ICD-10-CM

## 2020-12-30 DIAGNOSIS — S72352A Displaced comminuted fracture of shaft of left femur, initial encounter for closed fracture: Secondary | ICD-10-CM

## 2020-12-30 DIAGNOSIS — R2681 Unsteadiness on feet: Secondary | ICD-10-CM | POA: Diagnosis not present

## 2020-12-30 DIAGNOSIS — M79602 Pain in left arm: Secondary | ICD-10-CM

## 2020-12-30 DIAGNOSIS — M25512 Pain in left shoulder: Secondary | ICD-10-CM | POA: Diagnosis not present

## 2020-12-30 DIAGNOSIS — R29818 Other symptoms and signs involving the nervous system: Secondary | ICD-10-CM | POA: Diagnosis not present

## 2020-12-30 NOTE — Therapy (Addendum)
[facility] [address] [place], [place], [address] Phone: [phone]   Fax:  [phone]  Occupational Therapy Treatment  Patient Details  Name: Jason MurphyRichard Alexander MRN: [id number] Date of Birth: [birth year] Referring Provider (OT): Mariam Dollaraniel Angiulli, PA-C   Encounter Date: 12/30/2020   OT End of Session - 12/30/20 1541    Visit Number 7    Number of Visits 12    Date for OT Re-Evaluation 01/13/21    Authorization Type [employer]    Authorization Time Period 27 visit limit combined PT/OT/SP; 9 visits approved 12/04/20-02/02/21    Authorization - Visit Number 2    Authorization - Number of Visits 9    OT Start Time 1349    OT Stop Time 1452    OT Time Calculation (min) 63 min    Activity Tolerance Patient tolerated treatment well    Behavior During Therapy [facility] for tasks assessed/performed           Past Medical History:  Diagnosis Date  . Closed displaced comminuted fracture of shaft of left humerus 10/27/2020  . Closed displaced comminuted fracture of shaft of left radius 10/29/2020  . Closed displaced comminuted fracture of shaft of left ulna 10/29/2020  . Depression 2006   after divorce  . Displaced comminuted fracture of shaft of left femur, initial encounter for closed fracture (HCC) 10/29/2020  . 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 Vitamin D insufficiency 10/29/2020    Past Surgical History:  Procedure Laterality Date  . APPENDECTOMY    . FEMUR IM NAIL Left 10/28/2020   Procedure: INTRAMEDULLARY (IM) RETROGRADE FEMORAL NAILING;  Surgeon: Myrene GalasHandy, Michael, MD;  Location: [facility] OR;  Service: Orthopedics;  Laterality: Left;  . NASAL SINUS SURGERY     polyp removal  . ORIF HUMERUS FRACTURE Left 10/28/2020   Procedure: OPEN REDUCTION INTERNAL FIXATION (ORIF) HUMERAL SHAFT FRACTURE;  Surgeon: Myrene GalasHandy, Michael, MD;  Location: [facility] OR;  Service: Orthopedics;  Laterality: Left;  . ORIF RADIAL FRACTURE Left 10/28/2020   Procedure:  OPEN REDUCTION INTERNAL FIXATION (ORIF) RADIAL FRACTURE;  Surgeon: Myrene GalasHandy, Michael, MD;  Location: [facility] OR;  Service: Orthopedics;  Laterality: Left;    There were no vitals filed for this visit.   Subjective Assessment - 12/30/20 1351    Subjective  I have been getting a little feeling back.    Currently in Pain? No/denies                        OT Treatments/Exercises (OP) - 12/30/20 1352      Exercises   Exercises Shoulder;Elbow;Wrist;Hand;Theraputty      Shoulder Exercises: Seated   Protraction AROM;10 reps    Horizontal ABduction AAROM;10 reps    External Rotation AROM;10 reps   table top   Internal Rotation AROM;10 reps    Flexion AROM;10 reps    Abduction AAROM;10 reps      Shoulder Exercises: ROM/Strengthening   Other ROM/Strengthening Exercises squigz x2 reps of placement on high level door surface; minimal to moderate difficulty as seen by dropping of squigz and extended time.      Elbow Exercises   Other elbow exercises Elbow flexion and extention with pronated forearm x5      Additional Elbow Exercises   Theraputty - Roll yellow      Hand Exercises   Other Hand Exercises placing clothes pines on elevated surface 2 sets x10 reps to place. Able to remove pins as well. Minimal difficulty.  Unable to actively supinate. Clips places with wrist pronated.      Theraputty   Theraputty - Pinch yellow -2 point index and thumb; Active assisted supination for vertical pinch.      Manual Therapy   Manual Therapy Taping   triceps, wrist extensors   Kinesiotex Facilitate Muscle                  OT Education - 12/30/20 1504    Education Details coping strategies handout; stress management techniques    Person(s) Educated Patient    Methods Explanation;Demonstration;Handout    Comprehension Verbalized understanding            OT Short Term Goals - 12/18/20 1212      OT SHORT TERM GOAL #1   Title Pt will be provided with and educated on HEP to  improve LUE use as non-dominant during ADL completion.    Time 3    Period Weeks    Status Achieved    Target Date 12/23/20      OT SHORT TERM GOAL #2   Title Pt will increase LUE P/ROM to [facility] to improve mobility throughout LUE required to perform dressing and bathing tasks using LUE as assist.    Time 3    Period Weeks    Status Achieved      OT SHORT TERM GOAL #3   Title Pt will decrease LUE fascial restrictions to moderate amount to improve mobility required for functional reaching tasks.    Time 3    Period Weeks    Status On-going      OT SHORT TERM GOAL #4   Title Pt will increase left elbow strength to 2/5 flexion and 3+/5 extension to improve ability to use LUE to assist during UB dressing tasks.    Time 3    Period Weeks    Status On-going      OT SHORT TERM GOAL #5   Title Pt will be provided with necessary splints and educated on wear and care of splints for LUE wrist stability and functional use.    Time 3    Period Weeks    Status On-going      OT SHORT TERM GOAL #6   Title Pt will be educated on and verbalize appropriate understanding and utilization of sensory reintegration strategies.    Time 3    Period Weeks    Status Achieved             OT Long Term Goals - 12/09/20 0943      OT LONG TERM GOAL #1   Title Pt will decrease pain in LUE to 3/10 or less to improve ability to sleep for 3+ consecutive hours without waking due to pain.    Time 6    Period Weeks    Status On-going      OT LONG TERM GOAL #2   Title Pt will increase LUE A/ROM to [facility] to improve ability to use LUE as assist during eating and grooming tasks.    Time 6    Period Weeks    Status On-going      OT LONG TERM GOAL #3   Title Pt will increase LUE shoulder strength to 4/5 or greater to improve ability to participate in exercise routine.    Time 6    Period Weeks    Status On-going      OT LONG TERM GOAL #4   Title Pt will increase LUE elbow strength  to 4-/5 flexion and 4/5  extension to improve ability to bring washcloth to face.    Time 6    Period Weeks    Status On-going      OT LONG TERM GOAL #5   Title Pt will increase LUE wrist strength to 4/5 flexion and 3/5 extension to improve ability to turn items (keys, tops to jars, etc) during ADLs.    Time 6    Period Weeks    Status On-going      OT LONG TERM GOAL #6   Title Pt will increase LUE grip strength by 25# and pinch strength by 6# to improve ability to grasp and maintain hold on items during functional use.    Time 6    Period Weeks    Status On-going      OT LONG TERM GOAL #7   Title Pt will increase fine motor coordination by completing 9 hole peg test in under 30" to improve ability to manipulate small items during ADLs.    Time 6    Period Weeks    Status On-going                 Plan - 12/30/20 1507    Clinical Impression Statement A: Focused on shoulder active ROM and strengthening via squigz and clips activities. Able to complete all tasks with minimal rests breaks this date. Reported not having coping strategies and thus was provided with a handout with options and informatoin. Pt did not bring his splint to session this date. Active assisted ROM ustilized at times for supination of L wrists for theraputic activities. Kinesiotape placed again this date to facililatate elbow flexion and wrist stability.    Occupational performance deficits (Please refer to evaluation for details): ADL's;IADL's;Rest and Sleep;Work;Leisure    Body Structure / Function / Physical Skills ADL;Endurance;UE functional use;Fascial restriction;Flexibility;Pain;FMC;ROM;Coordination;Sensation;IADL;Strength;Mobility;Edema    Plan P: Attempt quadruped weight bearing. Review coping strategies selected by patient next session. Continue to use active assisted techniques as needed to encourage and improve supinaton of wrist. Continue application of kinisiotape as needed.    OT Home Exercise Plan 12/30/20 coping  strategies liest; stress management techniques    Consulted and Agree with Plan of Care Patient           Patient will benefit from skilled therapeutic intervention in order to improve the following deficits and impairments:   Body Structure / Function / Physical Skills: ADL,Endurance,UE functional use,Fascial restriction,Flexibility,Pain,FMC,ROM,Coordination,Sensation,IADL,Strength,Mobility,Edema       Visit Diagnosis: Muscle weakness (generalized)  Pain in left arm  Other symptoms and signs involving the musculoskeletal system  Other lack of coordination    Problem List Patient Active Problem List   Diagnosis Date Noted  . Critical polytrauma 11/15/2020  . Closed displaced comminuted fracture of shaft of left radius 10/29/2020  . Closed displaced comminuted fracture of shaft of left ulna 10/29/2020  . Displaced comminuted fracture of shaft of left femur, initial encounter for closed fracture (HCC) 10/29/2020  . Vitamin D insufficiency 10/29/2020  . 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 Closed displaced comminuted fracture of shaft of left humerus 10/27/2020  . Hypogonadism in male 08/15/2019  . Other male erectile dysfunction 11/08/2016  . Depression 10/05/2016  . Current every day smoker 10/05/2016  . Weight gain, abnormal 10/05/2016  . Generalized anxiety disorder 03/01/2016   Danie Chandler OT, MOT  Danie Chandler 12/30/2020, 3:48 PM  [facility] [address] [place],  [place], [address] Phone: [phone]   Fax:  [phone]  Name: Jason Alexander MRN: [id number] Date of Birth: February 19, 1976

## 2020-12-30 NOTE — Therapy (Signed)
[facility] [address] [place], [place], [address] Phone: [phone]   Fax:  [phone]  Physical Therapy Treatment  Patient Details  Name: Jason Alexander MRN: [id number] Date of Birth: [birth year] Referring Provider (PT): Montez Morita   Encounter Date: 12/30/2020   PT End of Session - 12/30/20 1507    Visit Number 7    Number of Visits 12    Date for PT Re-Evaluation 01/13/21    Authorization Type Medicaid [employer], request 12 visits, check auth. 12 visits approved    Authorization Time Period 12/02/20-01/31/21    Authorization - Visit Number 7    Authorization - Number of Visits 12    Progress Note Due on Visit 10    PT Start Time 1505    PT Stop Time 1600    PT Time Calculation (min) 55 min    Activity Tolerance Patient tolerated treatment well    Behavior During Therapy WFL for tasks assessed/performed           Past Medical History:  Diagnosis Date  . Closed displaced comminuted fracture of shaft of left humerus 10/27/2020  . Closed displaced comminuted fracture of shaft of left radius 10/29/2020  . Closed displaced comminuted fracture of shaft of left ulna 10/29/2020  . Depression 2006   after divorce  . Displaced comminuted fracture of shaft of left femur, initial encounter for closed fracture (HCC) 10/29/2020  . 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 Vitamin D insufficiency 10/29/2020    Past Surgical History:  Procedure Laterality Date  . APPENDECTOMY    . FEMUR IM NAIL Left 10/28/2020   Procedure: INTRAMEDULLARY (IM) RETROGRADE FEMORAL NAILING;  Surgeon: Myrene Galas, MD;  [facility]: [facility] OR;  Service: Orthopedics;  Laterality: Left;  . NASAL SINUS SURGERY     polyp removal  . ORIF HUMERUS FRACTURE Left 10/28/2020   Procedure: OPEN REDUCTION INTERNAL FIXATION (ORIF) HUMERAL SHAFT FRACTURE;  Surgeon: Myrene Galas, MD;  Location: [facility] OR;  Service: Orthopedics;  Laterality: Left;  . ORIF RADIAL FRACTURE Left  10/28/2020   Procedure: OPEN REDUCTION INTERNAL FIXATION (ORIF) RADIAL FRACTURE;  Surgeon: Myrene Galas, MD;  Location: [facility] OR;  Service: Orthopedics;  Laterality: Left;    There were no vitals filed for this visit.   Subjective Assessment - 12/30/20 1545    Subjective Patient reports experiencing pain/discomfort at left patellar tendon and medial patellofemoral border which he notices with stair ambulation and squatting motion.  Activities modified to reduce pain                             [facility] Adult PT Treatment/Exercise - 12/30/20 1510      Knee/Hip Exercises: Standing   Heel Raises 3 sets;10 reps;2 seconds    Knee Flexion Strengthening;Both;3 sets;10 reps    Knee Flexion Limitations 5    Hip Flexion Stengthening;Both;2 sets   2 min 5 lbs ankle weights with 10" step   Lateral Step Up Both;3 sets;Step Height: 4";5 reps   emphasis on LLE eccentric   Step Down Both;10 reps;Step Height: 4";1 set   increased left PF pain with maneuver   Other Standing Knee Exercises sidestepping 2x2 min with 5 lbs ankle weights    Other Standing Knee Exercises rythmic stabilization with LE elevated on dynadisc against moderate postural perturbations 3x2 min      Knee/Hip Exercises: Supine   Quad Sets Strengthening;Left;3 sets;10 reps    Short Arc  Quad Sets Strengthening;Both;3 sets;10 reps    Short Arc [employer] Limitations 10    Straight Leg Raises Strengthening;Left;3 sets;10 reps      Knee/Hip Exercises: Sidelying   Hip ABduction 2 sets;Left;10 reps                  PT Education - 12/30/20 1546    Education Details pt educated in activity modification to reduce left patellar pain    Person(s) Educated Patient    Methods Explanation    Comprehension Verbalized understanding            PT Short Term Goals - 12/30/20 1607      PT SHORT TERM GOAL #1   Title Patient will report at least 25% improvement in symptoms for improved quality of life.    Time 3     Period Weeks    Status On-going    Target Date 12/23/20      PT SHORT TERM GOAL #2   Title Patient will be independent with HEP in order to improve functional outcomes.    Baseline in development    Time 3    Period Weeks    Status On-going    Target Date 12/23/20      PT SHORT TERM GOAL #3   Title Patient will demonstrate left hip flexion AROM to 90 degrees to improve functional mobility    Baseline 65 degrees AAROM for flexion in supine    Time 3    Period Weeks    Status Achieved    Target Date 12/23/20             PT Long Term Goals - 12/02/20 1401      PT LONG TERM GOAL #1   Title Patient will report at least 50% improvement in overall symptoms and function to demonstrate overall improved functional ability    Time 6    Period Weeks    Status New    Target Date 01/13/21      PT LONG TERM GOAL #2   Title Patient will demonstrate improved functional ambulation as evidenced by distance of 350 ft during using least restrictive AD    Baseline 110 ft with left platform RW    Time 6    Period Weeks    Status New    Target Date 01/13/21      PT LONG TERM GOAL #3   Title Patient will be able to navigate stairs with reciprocal pattern without compensation in order to demonstrate improved LE strength.    Baseline min A for 2 stairs, step-to with NBQC    Time 6    Period Weeks    Status New    Target Date 01/13/21                 Plan - 12/30/20 1546    Clinical Impression Statement Patient reports pain in left patella which is increased with loading activities (squat, stairs, step-down maneuver) and demonstrates tenderness to palpation at inferior pole.  Activities modified to reduce loading pain and emphasis on VMO activiation to improve tracking and LE function. Continued tx sessions indicated to improve LLE function and decrease pain to enable normalized gait patern    Personal Factors and Comorbidities Behavior Pattern;Comorbidity 3+    Comorbidities  left humerus fracture, radial nerve injury, left radius/ulna fracture s/p ORIF    Examination-Activity Limitations Bed Mobility;Bend;Carry;Dressing;Lift;Toileting;Stand;Stairs;Squat;Sit;Transfers;Locomotion Level    Examination-Participation Restrictions Cleaning;Community Activity;Driving;Laundry;Yard Work;Shop;Occupation;Meal Prep    Rehab  Potential Good    PT Frequency 2x / week    PT Duration 6 weeks    PT Treatment/Interventions ADLs/Self Care Home Management;Aquatic Therapy;Biofeedback;Cryotherapy;Electrical Stimulation;DME Instruction;Ultrasound;Traction;Moist Heat;Gait training;Stair training;Functional mobility training;Therapeutic activities;Therapeutic exercise;Balance training;Patient/family education;Neuromuscular re-education;Wheelchair mobility training;Manual techniques;Manual lymph drainage;Compression bandaging;Passive range of motion;Taping;Splinting;Energy conservation;Dry needling;Spinal Manipulations;Joint Manipulations    PT Next Visit Plan improve squat symmetry and reduce left patellar/tendon pain    Consulted and Agree with Plan of Care Patient           Patient will benefit from skilled therapeutic intervention in order to improve the following deficits and impairments:  Abnormal gait,Decreased activity tolerance,Decreased balance,Decreased mobility,Decreased knowledge of use of DME,Decreased knowledge of precautions,Decreased endurance,Decreased coordination,Decreased range of motion,Decreased safety awareness,Decreased strength,Increased edema,Difficulty walking,Impaired perceived functional ability,Improper body mechanics,Impaired UE functional use,Pain  Visit Diagnosis: Difficulty in walking, not elsewhere classified  Muscle weakness (generalized)  Unsteadiness on feet  Displaced comminuted fracture of shaft of left femur, initial encounter for closed fracture [facility])     Problem List Patient Active Problem List   Diagnosis Date Noted  . Critical polytrauma  11/15/2020  . Closed displaced comminuted fracture of shaft of left radius 10/29/2020  . Closed displaced comminuted fracture of shaft of left ulna 10/29/2020  . Displaced comminuted fracture of shaft of left femur, initial encounter for closed fracture (HCC) 10/29/2020  . Vitamin D insufficiency 10/29/2020  . 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 Closed displaced comminuted fracture of shaft of left humerus 10/27/2020  . Hypogonadism in male 08/15/2019  . Other male erectile dysfunction 11/08/2016  . Depression 10/05/2016  . Current every day smoker 10/05/2016  . Weight gain, abnormal 10/05/2016  . Generalized anxiety disorder 03/01/2016    4:09 PM, 12/30/20 M. Shary Decamp, PT, DPT Physical Therapist- [place] Office Number: [phone]  [facility] [address] [place], [place], [address] Phone: [phone]   Fax:  [phone]  Name: Meko Masterson MRN: [id number] Date of Birth: February 19, 1976

## 2021-01-01 ENCOUNTER — Ambulatory Visit (HOSPITAL_COMMUNITY): Payer: Medicaid Other | Admitting: Occupational Therapy

## 2021-01-01 ENCOUNTER — Encounter (HOSPITAL_COMMUNITY): Payer: Self-pay | Admitting: Physical Therapy

## 2021-01-01 ENCOUNTER — Other Ambulatory Visit: Payer: Self-pay

## 2021-01-01 ENCOUNTER — Ambulatory Visit (HOSPITAL_COMMUNITY): Payer: Medicaid Other | Admitting: Physical Therapy

## 2021-01-01 ENCOUNTER — Encounter (HOSPITAL_COMMUNITY): Payer: Self-pay | Admitting: Occupational Therapy

## 2021-01-01 DIAGNOSIS — R29818 Other symptoms and signs involving the nervous system: Secondary | ICD-10-CM | POA: Diagnosis not present

## 2021-01-01 DIAGNOSIS — M25512 Pain in left shoulder: Secondary | ICD-10-CM | POA: Diagnosis not present

## 2021-01-01 DIAGNOSIS — R262 Difficulty in walking, not elsewhere classified: Secondary | ICD-10-CM | POA: Diagnosis not present

## 2021-01-01 DIAGNOSIS — S72352A Displaced comminuted fracture of shaft of left femur, initial encounter for closed fracture: Secondary | ICD-10-CM | POA: Diagnosis not present

## 2021-01-01 DIAGNOSIS — R29898 Other symptoms and signs involving the musculoskeletal system: Secondary | ICD-10-CM | POA: Diagnosis not present

## 2021-01-01 DIAGNOSIS — M25612 Stiffness of left shoulder, not elsewhere classified: Secondary | ICD-10-CM | POA: Diagnosis not present

## 2021-01-01 DIAGNOSIS — R2681 Unsteadiness on feet: Secondary | ICD-10-CM

## 2021-01-01 DIAGNOSIS — M6281 Muscle weakness (generalized): Secondary | ICD-10-CM | POA: Diagnosis not present

## 2021-01-01 DIAGNOSIS — M79602 Pain in left arm: Secondary | ICD-10-CM

## 2021-01-01 DIAGNOSIS — R278 Other lack of coordination: Secondary | ICD-10-CM | POA: Diagnosis not present

## 2021-01-01 NOTE — Therapy (Signed)
[facility] [address] [place], [place], [address] Phone: [phone]   Fax:  [phone]  Physical Therapy Treatment  Patient Details  Name: Jason Alexander MRN: [id number] Date of Birth: 11-02-1976 Referring Provider (PT): Montez Morita   Encounter Date: 01/01/2021   PT End of Session - 01/01/21 1400    Visit Number 8    Number of Visits 12    Date for PT Re-Evaluation 01/13/21    Authorization Type Medicaid [employer], request 12 visits, check auth. 12 visits approved    Authorization Time Period 12/02/20-01/31/21    Authorization - Visit Number 8    Authorization - Number of Visits 12    Progress Note Due on Visit 10    PT Start Time 1401    PT Stop Time 1441    PT Time Calculation (min) 40 min    Activity Tolerance Patient tolerated treatment well    Behavior During Therapy WFL for tasks assessed/performed           Past Medical History:  Diagnosis Date  . Closed displaced comminuted fracture of shaft of left humerus 10/27/2020  . Closed displaced comminuted fracture of shaft of left radius 10/29/2020  . Closed displaced comminuted fracture of shaft of left ulna 10/29/2020  . Depression 2006   after divorce  . Displaced comminuted fracture of shaft of left femur, initial encounter for closed fracture (HCC) 10/29/2020  . 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 Vitamin D insufficiency 10/29/2020    Past Surgical History:  Procedure Laterality Date  . APPENDECTOMY    . FEMUR IM NAIL Left 10/28/2020   Procedure: INTRAMEDULLARY (IM) RETROGRADE FEMORAL NAILING;  Surgeon: Myrene Galas, MD;  Location: [facility] OR;  Service: Orthopedics;  Laterality: Left;  . NASAL SINUS SURGERY     polyp removal  . ORIF HUMERUS FRACTURE Left 10/28/2020   Procedure: OPEN REDUCTION INTERNAL FIXATION (ORIF) HUMERAL SHAFT FRACTURE;  Surgeon: Myrene Galas, MD;  Location: [facility] OR;  Service: Orthopedics;  Laterality: Left;  . ORIF RADIAL FRACTURE Left  10/28/2020   Procedure: OPEN REDUCTION INTERNAL FIXATION (ORIF) RADIAL FRACTURE;  Surgeon: Myrene Galas, MD;  Location: [facility] OR;  Service: Orthopedics;  Laterality: Left;    There were no vitals filed for this visit.   Subjective Assessment - 01/01/21 1402    Subjective Patient states his thigh is very sore. He was able to ditch the cane and went [place] a [place] walk [place] and was able to walk to therapy today.    Currently in Pain? No/denies                             [facility] Adult PT Treatment/Exercise - 01/01/21 0001      Knee/Hip Exercises: Aerobic   Recumbent Bike 5 minutes for ROM, dynamic warm up and cardiovascular fitness      Knee/Hip Exercises: Standing   Step Down Both;10 reps;Step Height: 4";1 set;2 sets    Functional Squat 2 sets;10 reps    SLS with Vectors 5x 5 second holds      Manual Therapy   Manual Therapy Joint mobilization    Manual therapy comments completed separately from all other aspects of treatment    Joint Mobilization inferior patellar mobs, self patellar mobs                  PT Education - 01/01/21 1402    Education Details Patient  educated on HEP, exercise mechanics, patellar mobs   Person(s) Educated Patient    Methods Explanation;Demonstration    Comprehension Verbalized understanding;Returned demonstration            PT Short Term Goals - 12/30/20 1607      PT SHORT TERM GOAL #1   Title Patient will report at least 25% improvement in symptoms for improved quality of life.    Time 3    Period Weeks    Status On-going    Target Date 12/23/20      PT SHORT TERM GOAL #2   Title Patient will be independent with HEP in order to improve functional outcomes.    Baseline in development    Time 3    Period Weeks    Status On-going    Target Date 12/23/20      PT SHORT TERM GOAL #3   Title Patient will demonstrate left hip flexion AROM to 90 degrees to improve functional mobility    Baseline 65  degrees AAROM for flexion in supine    Time 3    Period Weeks    Status Achieved    Target Date 12/23/20             PT Long Term Goals - 12/02/20 1401      PT LONG TERM GOAL #1   Title Patient will report at least 50% improvement in overall symptoms and function to demonstrate overall improved functional ability    Time 6    Period Weeks    Status New    Target Date 01/13/21      PT LONG TERM GOAL #2   Title Patient will demonstrate improved functional ambulation as evidenced by distance of 350 ft during using least restrictive AD    Baseline 110 ft with left platform RW    Time 6    Period Weeks    Status New    Target Date 01/13/21      PT LONG TERM GOAL #3   Title Patient will be able to navigate stairs with reciprocal pattern without compensation in order to demonstrate improved LE strength.    Baseline min A for 2 stairs, step-to with NBQC    Time 6    Period Weeks    Status New    Target Date 01/13/21                 Plan - 01/01/21 1401    Clinical Impression Statement Began session on bike for dynamic warm up and cardiovascular fitness. Patient with c/o painful knee clicking, completed inferior patellar glides with patient stating improvement in symptoms following. Patient educated and shown how to perform self mobilizations. Patient showing impaired bilateral quad and hip strength with stair exercises and is educated on completing on both legs at home. Patient very unsteady requiring unilateral UE support for SLS with vectors with fatigue noted following. Patient showing improving squat mechanics but continues to have pain with increased depth, pain reduced/eliminated with reduced depth. Patient will continue to benefit from skilled physical therapy in order to reduce impairment and improve function.    Personal Factors and Comorbidities Behavior Pattern;Comorbidity 3+    Comorbidities left humerus fracture, radial nerve injury, left radius/ulna fracture  s/p ORIF    Examination-Activity Limitations Bed Mobility;Bend;Carry;Dressing;Lift;Toileting;Stand;Stairs;Squat;Sit;Transfers;Locomotion Level    Examination-Participation Restrictions Cleaning;Community Activity;Driving;Laundry;Yard Work;Shop;Occupation;Meal Prep    Rehab Potential Good    PT Frequency 2x / week    PT Duration 6 weeks  PT Treatment/Interventions ADLs/Self Care Home Management;Aquatic Therapy;Biofeedback;Cryotherapy;Electrical Stimulation;DME Instruction;Ultrasound;Traction;Moist Heat;Gait training;Stair training;Functional mobility training;Therapeutic activities;Therapeutic exercise;Balance training;Patient/family education;Neuromuscular re-education;Wheelchair mobility training;Manual techniques;Manual lymph drainage;Compression bandaging;Passive range of motion;Taping;Splinting;Energy conservation;Dry needling;Spinal Manipulations;Joint Manipulations    PT Next Visit Plan improve squat symmetry and reduce left patellar/tendon pain    PT Home Exercise Plan SLS with vectors    Consulted and Agree with Plan of Care Patient           Patient will benefit from skilled therapeutic intervention in order to improve the following deficits and impairments:  Abnormal gait,Decreased activity tolerance,Decreased balance,Decreased mobility,Decreased knowledge of use of DME,Decreased knowledge of precautions,Decreased endurance,Decreased coordination,Decreased range of motion,Decreased safety awareness,Decreased strength,Increased edema,Difficulty walking,Impaired perceived functional ability,Improper body mechanics,Impaired UE functional use,Pain  Visit Diagnosis: Difficulty in walking, not elsewhere classified  Muscle weakness (generalized)  Unsteadiness on feet  Displaced comminuted fracture of shaft of left femur, initial encounter for closed fracture [facility])     Problem List Patient Active Problem List   Diagnosis Date Noted  . Critical polytrauma 11/15/2020  . Closed  displaced comminuted fracture of shaft of left radius 10/29/2020  . Closed displaced comminuted fracture of shaft of left ulna 10/29/2020  . Displaced comminuted fracture of shaft of left femur, initial encounter for closed fracture (HCC) 10/29/2020  . Vitamin D insufficiency 10/29/2020  . 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 Closed displaced comminuted fracture of shaft of left humerus 10/27/2020  . Hypogonadism in male 08/15/2019  . Other male erectile dysfunction 11/08/2016  . Depression 10/05/2016  . Current every day smoker 10/05/2016  . Weight gain, abnormal 10/05/2016  . Generalized anxiety disorder 03/01/2016    2:47 PM, 01/01/21 Wyman Songster PT, DPT Physical Therapist at [facility]   [facility] [address] [place], [place], [address] Phone: [phone]   Fax:  [phone]  Name: Jason Alexander MRN: [id number] Date of Birth: [birth year]

## 2021-01-01 NOTE — Therapy (Signed)
[facility] [address] [place], [place], [address] Phone: [phone]   Fax:  [phone]  Occupational Therapy Treatment  Patient Details  Name: Jason Alexander MRN: [id number] Date of Birth: 03-19-1976 Referring Provider (OT): Mariam Dollar, PA-C   Encounter Date: 01/01/2021   OT End of Session - 01/01/21 1619    Visit Number 8    Number of Visits 12    Date for OT Re-Evaluation 01/13/21    Authorization Type [employer]    Authorization Time Period 27 visit limit combined PT/OT/SP; 9 visits approved 12/04/20-02/02/21    Authorization - Visit Number 3    Authorization - Number of Visits 9    OT Start Time 1516    OT Stop Time 1618    OT Time Calculation (min) 62 min    Activity Tolerance Patient tolerated treatment well    Behavior During Therapy [facility] for tasks assessed/performed           Past Medical History:  Diagnosis Date  . Closed displaced comminuted fracture of shaft of left humerus 10/27/2020  . Closed displaced comminuted fracture of shaft of left radius 10/29/2020  . Closed displaced comminuted fracture of shaft of left ulna 10/29/2020  . Depression 2006   after divorce  . Displaced comminuted fracture of shaft of left femur, initial encounter for closed fracture (HCC) 10/29/2020  . 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 Vitamin D insufficiency 10/29/2020    Past Surgical History:  Procedure Laterality Date  . APPENDECTOMY    . FEMUR IM NAIL Left 10/28/2020   Procedure: INTRAMEDULLARY (IM) RETROGRADE FEMORAL NAILING;  Surgeon: Myrene Galas, MD;  Location: [facility] OR;  Service: Orthopedics;  Laterality: Left;  . NASAL SINUS SURGERY     polyp removal  . ORIF HUMERUS FRACTURE Left 10/28/2020   Procedure: OPEN REDUCTION INTERNAL FIXATION (ORIF) HUMERAL SHAFT FRACTURE;  Surgeon: Myrene Galas, MD;  Location: [facility] OR;  Service: Orthopedics;  Laterality: Left;  . ORIF RADIAL FRACTURE Left 10/28/2020   Procedure:  OPEN REDUCTION INTERNAL FIXATION (ORIF) RADIAL FRACTURE;  Surgeon: Myrene Galas, MD;  Location: [facility] OR;  Service: Orthopedics;  Laterality: Left;    There were no vitals filed for this visit.   Subjective Assessment - 01/01/21 1513    Subjective  S: I like to take short walks to cope.    Currently in Pain? No/denies              [facility] OT Assessment - 01/01/21 1512      Assessment   Medical Diagnosis s/p left humerus ORIF, left ulna and radius ORIF, radius grafting, exploration of radial nerve      Precautions   Precautions Fall    Type of Shoulder Precautions Progress as tolerated. No orthopedic precautions    Required Braces or Orthoses Other Brace/Splint    Other Brace/Splint Wrist cock up splint to be worn during fine motor tasks to stabilize wrist.      Restrictions   Weight Bearing Restrictions No                    OT Treatments/Exercises (OP) - 01/01/21 1517      Exercises   Exercises Shoulder;Elbow;Wrist;Hand;Theraputty      Shoulder Exercises: Seated   Protraction AROM;10 reps    Horizontal ABduction AROM;10 reps    External Rotation AROM;10 reps    Internal Rotation AROM;10 reps    Flexion AROM;10 reps    Abduction AROM;10 reps  Shoulder Exercises: ROM/Strengthening   X to V Arms 10X    Proximal Shoulder Strengthening, Seated 10X each, no rest breaks      Elbow Exercises   Forearm Supination AROM;10 reps    Forearm Pronation AROM;10 reps    Other elbow exercises Elbow flexion and extention with pronated forearm 10X      Additional Elbow Exercises   Hand Gripper with Large Beads all beads gripper at 25#, horizontal      Hand Exercises   Other Hand Exercises Placing clothespins along top and left side of large pegboard with OT holding pegboard vertically, mod difficulty      Neurological Re-education Exercises   Shoulder Protraction --    Weight Bearing Position Quadraped;Seated    Seated with weight on hand Pt seated with hand on  edge of mat table, LUE in full extension. Weight bearing on LUE for 1', 2 trials    Quadraped with weight on forearm Pt in quadruped with weight on forearm, reaching forward with right arm to grasp squigz, then reaching to the left to place on mirror. Attempted with weight on hand and OT bracing elbow, discontinued due to wrist pain                    OT Short Term Goals - 12/18/20 1212      OT SHORT TERM GOAL #1   Title Pt will be provided with and educated on HEP to improve LUE use as non-dominant during ADL completion.    Time 3    Period Weeks    Status Achieved    Target Date 12/23/20      OT SHORT TERM GOAL #2   Title Pt will increase LUE P/ROM to [facility] to improve mobility throughout LUE required to perform dressing and bathing tasks using LUE as assist.    Time 3    Period Weeks    Status Achieved      OT SHORT TERM GOAL #3   Title Pt will decrease LUE fascial restrictions to moderate amount to improve mobility required for functional reaching tasks.    Time 3    Period Weeks    Status On-going      OT SHORT TERM GOAL #4   Title Pt will increase left elbow strength to 2/5 flexion and 3+/5 extension to improve ability to use LUE to assist during UB dressing tasks.    Time 3    Period Weeks    Status On-going      OT SHORT TERM GOAL #5   Title Pt will be provided with necessary splints and educated on wear and care of splints for LUE wrist stability and functional use.    Time 3    Period Weeks    Status On-going      OT SHORT TERM GOAL #6   Title Pt will be educated on and verbalize appropriate understanding and utilization of sensory reintegration strategies.    Time 3    Period Weeks    Status Achieved             OT Long Term Goals - 12/09/20 0943      OT LONG TERM GOAL #1   Title Pt will decrease pain in LUE to 3/10 or less to improve ability to sleep for 3+ consecutive hours without waking due to pain.    Time 6    Period Weeks    Status  On-going      OT LONG  TERM GOAL #2   Title Pt will increase LUE A/ROM to [facility] to improve ability to use LUE as assist during eating and grooming tasks.    Time 6    Period Weeks    Status On-going      OT LONG TERM GOAL #3   Title Pt will increase LUE shoulder strength to 4/5 or greater to improve ability to participate in exercise routine.    Time 6    Period Weeks    Status On-going      OT LONG TERM GOAL #4   Title Pt will increase LUE elbow strength to 4-/5 flexion and 4/5 extension to improve ability to bring washcloth to face.    Time 6    Period Weeks    Status On-going      OT LONG TERM GOAL #5   Title Pt will increase LUE wrist strength to 4/5 flexion and 3/5 extension to improve ability to turn items (keys, tops to jars, etc) during ADLs.    Time 6    Period Weeks    Status On-going      OT LONG TERM GOAL #6   Title Pt will increase LUE grip strength by 25# and pinch strength by 6# to improve ability to grasp and maintain hold on items during functional use.    Time 6    Period Weeks    Status On-going      OT LONG TERM GOAL #7   Title Pt will increase fine motor coordination by completing 9 hole peg test in under 30" to improve ability to manipulate small items during ADLs.    Time 6    Period Weeks    Status On-going                 Plan - 01/01/21 1606    Clinical Impression Statement A: Pt completing weightbearing in quadruped and seated, discontinued quadruped due to pain in wrist. Pt completing A/ROM for left shoulder, elbow, and forearm. Added hand gripper task working on grip and functional reach of LUE. Pt with 0/5 wrist extension and forearm pronated in extension. Pt reporting he has not slept in 2 days, educated on sleep hygiene and activities to trial. Pt reporting he has been walking for stress management. Continued with pinch activities using standard clothespins working on in hand manipulation of clothespin before placing.    Body Structure /  Function / Physical Skills ADL;Endurance;UE functional use;Fascial restriction;Flexibility;Pain;FMC;ROM;Coordination;Sensation;IADL;Strength;Mobility;Edema    Plan P: Follow up on stress management and sleep hygiene, provide information on PHP program and/or behavioral health resources per pt request    OT Home Exercise Plan 12/30/20 coping strategies liest; stress management techniques; 1/27: sleep hygiene    Consulted and Agree with Plan of Care Patient           Patient will benefit from skilled therapeutic intervention in order to improve the following deficits and impairments:   Body Structure / Function / Physical Skills: ADL,Endurance,UE functional use,Fascial restriction,Flexibility,Pain,FMC,ROM,Coordination,Sensation,IADL,Strength,Mobility,Edema       Visit Diagnosis: Pain in left arm  Other symptoms and signs involving the musculoskeletal system  Other lack of coordination    Problem List Patient Active Problem List   Diagnosis Date Noted  . Critical polytrauma 11/15/2020  . Closed displaced comminuted fracture of shaft of left radius 10/29/2020  . Closed displaced comminuted fracture of shaft of left ulna 10/29/2020  . Displaced comminuted fracture of shaft of left femur, initial encounter for closed fracture (HCC)  10/29/2020  . Vitamin D insufficiency 10/29/2020  . 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 Closed displaced comminuted fracture of shaft of left humerus 10/27/2020  . Hypogonadism in male 08/15/2019  . Other male erectile dysfunction 11/08/2016  . Depression 10/05/2016  . Current every day smoker 10/05/2016  . Weight gain, abnormal 10/05/2016  . Generalized anxiety disorder 03/01/2016   Ezra Sites, OTR/L  [phone] 01/01/2021, 4:20 PM  [facility] [address] [place], [place], [address] Phone: [phone]   Fax:  [phone]  Name: Jason Alexander MRN: [id number] Date of Birth: Dec 20, 1975

## 2021-01-02 ENCOUNTER — Encounter: Payer: Self-pay | Admitting: Physical Medicine and Rehabilitation

## 2021-01-02 ENCOUNTER — Encounter
Payer: Medicaid Other | Attending: Physical Medicine and Rehabilitation | Admitting: Physical Medicine and Rehabilitation

## 2021-01-02 ENCOUNTER — Other Ambulatory Visit: Payer: Self-pay

## 2021-01-02 VITALS — BP 120/78 | HR 83 | Temp 98.1°F | Ht 72.0 in | Wt 222.2 lb

## 2021-01-02 DIAGNOSIS — F32A Depression, unspecified: Secondary | ICD-10-CM | POA: Insufficient documentation

## 2021-01-02 DIAGNOSIS — T07XXXA Unspecified multiple injuries, initial encounter: Secondary | ICD-10-CM | POA: Insufficient documentation

## 2021-01-02 DIAGNOSIS — G5632 Lesion of radial nerve, left upper limb: Secondary | ICD-10-CM | POA: Diagnosis not present

## 2021-01-02 DIAGNOSIS — S42352S Displaced comminuted fracture of shaft of humerus, left arm, sequela: Secondary | ICD-10-CM | POA: Insufficient documentation

## 2021-01-02 DIAGNOSIS — S52352S Displaced comminuted fracture of shaft of radius, left arm, sequela: Secondary | ICD-10-CM | POA: Diagnosis not present

## 2021-01-02 DIAGNOSIS — F322 Major depressive disorder, single episode, severe without psychotic features: Secondary | ICD-10-CM | POA: Insufficient documentation

## 2021-01-02 MED ORDER — NORTRIPTYLINE HCL 10 MG PO CAPS: 10.0000 mg | ORAL_CAPSULE | Freq: Every day | ORAL | 1 refills | Status: DC

## 2021-01-02 NOTE — Addendum Note (Signed)
Addended by: Horton Chin on: 01/02/2021 11:34 AM   Modules accepted: Orders

## 2021-01-02 NOTE — Progress Notes (Addendum)
Subjective:    Patient ID: Jason Alexander, male    DOB: [birth year], 45 y.o.   MRN: [id number]  HPI  Jason Alexander is a 45 year old man who presents for hospital follow-up following critical polytrauma.  1) Radial nerve palsy, left side: He has followed with other specialists and has been told he has radial nerve palsy. He has been doing therapy  2) Cognitive deficits: Memories are slowly coming back. He still has trouble with the date, coherence. He has been doing therapy.   3) Pain: Average pain is 3/10.  4) Left radial fracture: He continues to have significant weakness in his let hand. He has been thingking positive.   5) Polysubstance abuse: He has been addicted to substances for 20 years. That is what lead to the accident.   6) Depression: He is going through a divorce. He is taking baby steps to put things together.   7) Insomnia: He is having nightmares.    Pain Inventory Average Pain 3 Pain Right Now 4 My pain is constant, burning and tingling  LOCATION OF PAIN  Left arm, Left knee, left leg BOWEL Number of stools per week: 2 Oral laxative use No  Type of laxative none Enema or suppository use No  History of colostomy No  Incontinent No   BLADDER Normal Able to self cath Not anymore Bladder incontinence No  Frequent urination Yes  Leakage with coughing No  Difficulty starting stream No  Incomplete bladder emptying Yes    Mobility use a cane how many minutes can you walk? 10 mins ability to climb steps?  yes do you drive?  no Do you have any goals in this area?  yes  Function not employed: date last employed Not worked since 10/27/2020 I need assistance with the following:  meal prep, household duties and shopping Do you have any goals in this area?  yes  Neuro/Psych weakness numbness tremor tingling trouble walking spasms dizziness confusion depression  Prior Studies Any changes since last visit?  yes [place]  Physicians involved in  your care Any changes since last visit?  no New Patient   Family History  Problem Relation Age of Onset  . ADD / ADHD Mother   . Hepatitis C Mother   . HIV Mother   . COPD Mother   . Alcohol abuse Mother   . Drug abuse Mother   . Heart disease Mother   . Heart attack Mother   . Hypertension Sister   . Cancer Father        colon   Social History   Socioeconomic History  . Marital status: Married    Spouse name: Lurena Joiner  . Number of children: Not on file  . Years of education: Not on file  . Highest education level: Not on file  Occupational History  . Occupation: [employer]     Comment: [employer] - pt has given verbal permission to give information to boss  Tobacco Use  . Smoking status: Current Every Day Smoker    Packs/day: 1.00    Years: 20.00    Pack years: 20.00    Types: Cigarettes  . Smokeless tobacco: Never Used  . Tobacco comment: trying to quit  Substance and Sexual Activity  . Alcohol use: No  . Drug use: Yes    Types: Marijuana    Comment: 06/17/20 not daily, doesn't smoke, edibles  . Sexual activity: Yes    Comment: same partner male partner for 10 years,  2 kids  Other Topics Concern  . Not on file  Social History Narrative   ** Merged History Encounter **       Lives with wife   Social Determinants of Health   Financial Resource Strain: Not on file  Food Insecurity: Not on file  Transportation Needs: Not on file  Physical Activity: Not on file  Stress: Not on file  Social Connections: Not on file   Past Surgical History:  Procedure Laterality Date  . APPENDECTOMY    . FEMUR IM NAIL Left 10/28/2020   Procedure: INTRAMEDULLARY (IM) RETROGRADE FEMORAL NAILING;  Surgeon: Myrene Galas, MD;  Location: [facility] OR;  Service: Orthopedics;  Laterality: Left;  . NASAL SINUS SURGERY     polyp removal  . ORIF HUMERUS FRACTURE Left 10/28/2020   Procedure: OPEN REDUCTION INTERNAL FIXATION (ORIF) HUMERAL SHAFT FRACTURE;  Surgeon: Myrene Galas, MD;  Location: [facility] OR;  Service: Orthopedics;  Laterality: Left;  . ORIF RADIAL FRACTURE Left 10/28/2020   Procedure: OPEN REDUCTION INTERNAL FIXATION (ORIF) RADIAL FRACTURE;  Surgeon: Myrene Galas, MD;  Location: [facility] OR;  Service: Orthopedics;  Laterality: Left;   Past Medical History:  Diagnosis Date  . Closed displaced comminuted fracture of shaft of left humerus 10/27/2020  . Closed displaced comminuted fracture of shaft of left radius 10/29/2020  . Closed displaced comminuted fracture of shaft of left ulna 10/29/2020  . Depression 2006   after divorce  . Displaced comminuted fracture of shaft of left femur, initial encounter for closed fracture (HCC) 10/29/2020  . 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 Vitamin D insufficiency 10/29/2020   There were no vitals taken for this visit.  Opioid Risk Score:   Fall Risk Score:  `1  Depression screen PHQ 2/9  Depression screen [facility] 2/9 10/08/2020 04/22/2020 09/18/2019 08/14/2019 06/22/2018 11/08/2016 10/25/2016  Decreased Interest 3 0 0 0 0 2 3  Down, Depressed, Hopeless 3 0 1 0 1 1 3   PHQ - 2 Score 6 0 1 0 1 3 6   Altered sleeping 3 - - - - 1 2  Tired, decreased energy 3 - - - - 3 3  Change in appetite 3 - - - - 1 1  Feeling bad or failure about yourself  3 - - - - 2 3  Trouble concentrating 3 - - - - 3 3  Moving slowly or fidgety/restless 3 - - - - 3 2  Suicidal thoughts 0 - - - - 0 0  PHQ-9 Score 24 - - - - 16 20  Difficult doing work/chores - - - - - - -   Review of Systems  Musculoskeletal: Positive for back pain and gait problem. Negative for neck pain.  Neurological: Positive for dizziness, tremors, weakness, numbness and headaches.  Psychiatric/Behavioral: Positive for confusion.       Depression, anxiety, memory issues  All other systems reviewed and are negative.      Objective:   Physical Exam Gen: no distress, normal appearing HEENT: oral mucosa pink and moist, NCAT Cardio: Reg rate Chest: normal effort,  normal rate of breathing Abd: soft, non-distended Ext: no edema Psych: pleasant, normal affect, tearful at times about his depression.  Skin: post-surgical scarring on left upper extremity, well healed. Neuro: Alert and oriented x3. Some difficulty with coherence.  Musculoskeletal: Left sided upper extremity weakness. 0/5 wrist extension, 3/5 EE, EF. Strength is otherwise intact. Sensation decreased throughout left arm.      Assessment & Plan:  Jason Alexander  is a 45 year old man who presents for follow-up of critical   1) Radial nerve palsy, left side: He has followed with other specialists and has been told he has radial nerve palsy. He has been doing therapy. Continue intensive outpatient PT and OT. Discussed that the improvements in strength he is experiencing is a good sign. Discussed typical prognosis for nerve injury.   2) Cognitive deficits: Memories are slowly coming back. He still has trouble with the date, coherence. He has been doing therapy. Continue intensive outpatient SLP. Provided referral to neuropsych.   3) Pain: Average pain is 3/10.  4) Left radial fracture: He continues to have significant weakness in his let hand. He has been thingking positive.  C  5) Polysubstance abuse: He has been addicted to substances for 20 years. That is what lead to the accident. Commended his efforts to stay clean. He is currently 9 weeks sober!  6) Depression: He is going through a divorce. He is taking baby steps to put things together. Pamelor below will help with this too. No suicidal ideation currently. Provided neuropsych referral and psychiatry referral  7) Insomnia: He is having nightmares. Prescribed Pamelor 10mg  HS.

## 2021-01-05 ENCOUNTER — Encounter (HOSPITAL_COMMUNITY): Payer: Self-pay

## 2021-01-05 ENCOUNTER — Encounter (HOSPITAL_COMMUNITY): Payer: Medicaid Other

## 2021-01-05 ENCOUNTER — Ambulatory Visit (HOSPITAL_COMMUNITY): Payer: Medicaid Other | Admitting: Physical Therapy

## 2021-01-05 ENCOUNTER — Other Ambulatory Visit: Payer: Self-pay

## 2021-01-05 ENCOUNTER — Encounter (HOSPITAL_COMMUNITY): Payer: Self-pay | Admitting: Physical Therapy

## 2021-01-05 ENCOUNTER — Ambulatory Visit (HOSPITAL_COMMUNITY): Payer: Medicaid Other

## 2021-01-05 DIAGNOSIS — R262 Difficulty in walking, not elsewhere classified: Secondary | ICD-10-CM | POA: Diagnosis not present

## 2021-01-05 DIAGNOSIS — R2681 Unsteadiness on feet: Secondary | ICD-10-CM

## 2021-01-05 DIAGNOSIS — R29818 Other symptoms and signs involving the nervous system: Secondary | ICD-10-CM

## 2021-01-05 DIAGNOSIS — R29898 Other symptoms and signs involving the musculoskeletal system: Secondary | ICD-10-CM

## 2021-01-05 DIAGNOSIS — M25612 Stiffness of left shoulder, not elsewhere classified: Secondary | ICD-10-CM | POA: Diagnosis not present

## 2021-01-05 DIAGNOSIS — R278 Other lack of coordination: Secondary | ICD-10-CM | POA: Diagnosis not present

## 2021-01-05 DIAGNOSIS — M6281 Muscle weakness (generalized): Secondary | ICD-10-CM

## 2021-01-05 DIAGNOSIS — S72352A Displaced comminuted fracture of shaft of left femur, initial encounter for closed fracture: Secondary | ICD-10-CM

## 2021-01-05 DIAGNOSIS — M79602 Pain in left arm: Secondary | ICD-10-CM | POA: Diagnosis not present

## 2021-01-05 DIAGNOSIS — M25512 Pain in left shoulder: Secondary | ICD-10-CM | POA: Diagnosis not present

## 2021-01-05 NOTE — Patient Instructions (Signed)
Complete the following exercises 2-3 times a day.  Doorway Stretch  Place each hand opposite each other on the doorway. (You can change where you feel the stretch by moving arms higher or lower.) Step through with one foot and bend front knee until a stretch is felt and hold. Step through with the opposite foot on the next rep. Hold for __20-30___ seconds. Repeat __2__times.        Wall Flexion  Slide your arm up the wall or door frame until a stretch is felt in your shoulder . Hold for 20-30 seconds. Complete 2 times     Shoulder Abduction Stretch  Stand side ways by a wall with affected up on wall. Gently step in toward wall to feel stretch. Hold for 20-30 seconds. Complete 2 times.   

## 2021-01-05 NOTE — Therapy (Signed)
[facility] [address] [place], [place], [address] Phone: [phone]   Fax:  [phone]  Physical Therapy Treatment  Patient Details  Name: Jason Alexander MRN: [id number] Date of Birth: 1976/09/10 Referring Provider (PT): Montez Morita   Encounter Date: 01/05/2021   PT End of Session - 01/05/21 1738    Visit Number 9    Number of Visits 12    Date for PT Re-Evaluation 01/13/21    Authorization Type Medicaid [employer], request 12 visits, check auth. 12 visits approved    Authorization Time Period 12/02/20-01/31/21    Authorization - Visit Number 9    Authorization - Number of Visits 12    Progress Note Due on Visit 10    PT Start Time 1459    PT Stop Time 1550    PT Time Calculation (min) 51 min    Activity Tolerance Patient tolerated treatment well    Behavior During Therapy WFL for tasks assessed/performed           Past Medical History:  Diagnosis Date  . Closed displaced comminuted fracture of shaft of left humerus 10/27/2020  . Closed displaced comminuted fracture of shaft of left radius 10/29/2020  . Closed displaced comminuted fracture of shaft of left ulna 10/29/2020  . Depression 2006   after divorce  . Displaced comminuted fracture of shaft of left femur, initial encounter for closed fracture (HCC) 10/29/2020  . 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 Vitamin D insufficiency 10/29/2020    Past Surgical History:  Procedure Laterality Date  . APPENDECTOMY    . FEMUR IM NAIL Left 10/28/2020   Procedure: INTRAMEDULLARY (IM) RETROGRADE FEMORAL NAILING;  Surgeon: Myrene Galas, MD;  Location: [facility];  Service: Orthopedics;  Laterality: Left;  . NASAL SINUS SURGERY     polyp removal  . ORIF HUMERUS FRACTURE Left 10/28/2020   Procedure: OPEN REDUCTION INTERNAL FIXATION (ORIF) HUMERAL SHAFT FRACTURE;  Surgeon: Myrene Galas, MD;  Location: [facility];  Service: Orthopedics;  Laterality: Left;  . ORIF RADIAL FRACTURE Left  10/28/2020   Procedure: OPEN REDUCTION INTERNAL FIXATION (ORIF) RADIAL FRACTURE;  Surgeon: Myrene Galas, MD;  Location: [facility];  Service: Orthopedics;  Laterality: Left;    There were no vitals filed for this visit.   Subjective Assessment - 01/05/21 1737    Subjective Patient says he does not have pain in his knee today, "just constant discomfort"    Currently in Pain? No/denies                         Adult Aquatic Therapy - 01/05/21 1746      Treatment   Gait pool ambulation 3 RT warmup, sidestepping x 2RT, retro walking x 2RT, tandem gait x 2RT (all using pool dumbbell supports)    Exercises shoulder adduction (multicolor dumbbells) 3 x 10 focus on eccentrics, 3 x 10 wrist flexion eccentric, mini squats 2 x 10, heel raise 2 x10 single leg stand 2 x 10" holds with dumbbell supports                        PT Short Term Goals - 12/30/20 1607      PT SHORT TERM GOAL #1   Title Patient will report at least 25% improvement in symptoms for improved quality of life.    Time 3    Period Weeks    Status On-going    Target Date 12/23/20  PT SHORT TERM GOAL #2   Title Patient will be independent with HEP in order to improve functional outcomes.    Baseline in development    Time 3    Period Weeks    Status On-going    Target Date 12/23/20      PT SHORT TERM GOAL #3   Title Patient will demonstrate left hip flexion AROM to 90 degrees to improve functional mobility    Baseline 65 degrees AAROM for flexion in supine    Time 3    Period Weeks    Status Achieved    Target Date 12/23/20             PT Long Term Goals - 12/02/20 1401      PT LONG TERM GOAL #1   Title Patient will report at least 50% improvement in overall symptoms and function to demonstrate overall improved functional ability    Time 6    Period Weeks    Status New    Target Date 01/13/21      PT LONG TERM GOAL #2   Title Patient will demonstrate improved functional  ambulation as evidenced by distance of 350 ft during using least restrictive AD    Baseline 110 ft with left platform RW    Time 6    Period Weeks    Status New    Target Date 01/13/21      PT LONG TERM GOAL #3   Title Patient will be able to navigate stairs with reciprocal pattern without compensation in order to demonstrate improved LE strength.    Baseline min A for 2 stairs, step-to with NBQC    Time 6    Period Weeks    Status New    Target Date 01/13/21                 Plan - 01/05/21 1739    Clinical Impression Statement Patient tolerated session well today. Initiated aquatic therapy, patient eductaed on benefits and application. Session focused on balance, dynamic balance, LE strenghening and LR arm strength/ funcitoning. Encouraged patient to use pool dumbbell in both hands. Patient noted fair control with LT hand and some passive stretching during use for dynamic balance activity. Patient did well with balance in gravity reduced pool setting. Patient able to perform some eccentric exercise for LT shoulder adduction and wrist flexion using smallest resistance dumbbell. Patient was challnged with mini squat form, but notes decreased knee pain when compared to land based activity. Patient will continue to benefit from skilled therpay services to progress srength and balance for improved functional mobility and reduced risk for future falls.    Personal Factors and Comorbidities Behavior Pattern;Comorbidity 3+    Comorbidities left humerus fracture, radial nerve injury, left radius/ulna fracture s/p ORIF    Examination-Activity Limitations Bed Mobility;Bend;Carry;Dressing;Lift;Toileting;Stand;Stairs;Squat;Sit;Transfers;Locomotion Level    Examination-Participation Restrictions Cleaning;Community Activity;Driving;Laundry;Yard Work;Shop;Occupation;Meal Prep    Rehab Potential Good    PT Frequency 2x / week    PT Duration 6 weeks    PT Treatment/Interventions ADLs/Self Care  Home Management;Aquatic Therapy;Biofeedback;Cryotherapy;Electrical Stimulation;DME Instruction;Ultrasound;Traction;Moist Heat;Gait training;Stair training;Functional mobility training;Therapeutic activities;Therapeutic exercise;Balance training;Patient/family education;Neuromuscular re-education;Wheelchair mobility training;Manual techniques;Manual lymph drainage;Compression bandaging;Passive range of motion;Taping;Splinting;Energy conservation;Dry needling;Spinal Manipulations;Joint Manipulations    PT Next Visit Plan improve squat symmetry and reduce left patellar/tendon pain    PT Home Exercise Plan SLS with vectors    Consulted and Agree with Plan of Care Patient  Patient will benefit from skilled therapeutic intervention in order to improve the following deficits and impairments:  Abnormal gait,Decreased activity tolerance,Decreased balance,Decreased mobility,Decreased knowledge of use of DME,Decreased knowledge of precautions,Decreased endurance,Decreased coordination,Decreased range of motion,Decreased safety awareness,Decreased strength,Increased edema,Difficulty walking,Impaired perceived functional ability,Improper body mechanics,Impaired UE functional use,Pain  Visit Diagnosis: Displaced comminuted fracture of shaft of left femur, initial encounter for closed fracture (HCC)  Muscle weakness (generalized)  Difficulty in walking, not elsewhere classified  Unsteadiness on feet     Problem List Patient Active Problem List   Diagnosis Date Noted  . Critical polytrauma 11/15/2020  . Closed displaced comminuted fracture of shaft of left radius 10/29/2020  . Closed displaced comminuted fracture of shaft of left ulna 10/29/2020  . Displaced comminuted fracture of shaft of left femur, initial encounter for closed fracture (HCC) 10/29/2020  . Vitamin D insufficiency 10/29/2020  . 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 Closed displaced comminuted fracture of shaft of left  humerus 10/27/2020  . Hypogonadism in male 08/15/2019  . Other male erectile dysfunction 11/08/2016  . Depression 10/05/2016  . Current every day smoker 10/05/2016  . Weight gain, abnormal 10/05/2016  . Generalized anxiety disorder 03/01/2016   5:51 PM, 01/05/21 Georges Lynch PT DPT  Physical Therapist with [facility]  [facility]  [phone]   [facility] [address] [place], [place], [address] Phone: [phone]   Fax:  [phone]  Name: Jason Alexander MRN: [id number] Date of Birth: 03/22/1976

## 2021-01-05 NOTE — Therapy (Signed)
[facility] [address] [place], [place], [address] Phone: [phone]   Fax:  [phone]  Occupational Therapy Treatment  Patient Details  Name: Jason Alexander MRN: [id number] Date of Birth: 1976-11-08 Referring Provider (OT): Mariam Dollar, PA-C   Encounter Date: 01/05/2021   OT End of Session - 01/05/21 1357    Visit Number 9    Number of Visits 12    Date for OT Re-Evaluation 01/13/21    Authorization Type [employer]    Authorization Time Period 27 visit limit combined PT/OT/SP; 9 visits approved 12/04/20-02/02/21    Authorization - Visit Number 4    Authorization - Number of Visits 9    OT Start Time 1300    OT Stop Time 1341    OT Time Calculation (min) 41 min    Activity Tolerance Patient tolerated treatment well    Behavior During Therapy [facility] for tasks assessed/performed           Past Medical History:  Diagnosis Date  . Closed displaced comminuted fracture of shaft of left humerus 10/27/2020  . Closed displaced comminuted fracture of shaft of left radius 10/29/2020  . Closed displaced comminuted fracture of shaft of left ulna 10/29/2020  . Depression 2006   after divorce  . Displaced comminuted fracture of shaft of left femur, initial encounter for closed fracture (HCC) 10/29/2020  . 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 Vitamin D insufficiency 10/29/2020    Past Surgical History:  Procedure Laterality Date  . APPENDECTOMY    . FEMUR IM NAIL Left 10/28/2020   Procedure: INTRAMEDULLARY (IM) RETROGRADE FEMORAL NAILING;  Surgeon: Myrene Galas, MD;  Location: [facility] OR;  Service: Orthopedics;  Laterality: Left;  . NASAL SINUS SURGERY     polyp removal  . ORIF HUMERUS FRACTURE Left 10/28/2020   Procedure: OPEN REDUCTION INTERNAL FIXATION (ORIF) HUMERAL SHAFT FRACTURE;  Surgeon: Myrene Galas, MD;  Location: [facility] OR;  Service: Orthopedics;  Laterality: Left;  . ORIF RADIAL FRACTURE Left 10/28/2020   Procedure:  OPEN REDUCTION INTERNAL FIXATION (ORIF) RADIAL FRACTURE;  Surgeon: Myrene Galas, MD;  Location: [facility] OR;  Service: Orthopedics;  Laterality: Left;    There were no vitals filed for this visit.   Subjective Assessment - 01/05/21 1353    Subjective  S: I am doing better mentally this week than last week. I am making more progress.    Currently in Pain? No/denies              [facility] OT Assessment - 01/05/21 1355      Assessment   Medical Diagnosis s/p left humerus ORIF, left ulna and radius ORIF, radius grafting, exploration of radial nerve      Precautions   Precautions Fall    Type of Shoulder Precautions Progress as tolerated. No orthopedic precautions    Required Braces or Orthoses Other Brace/Splint    Other Brace/Splint Wrist cock up splint to be worn during fine motor tasks to stabilize wrist.                    OT Treatments/Exercises (OP) - 01/05/21 1355      Exercises   Exercises Shoulder;Elbow;Wrist;Hand;Theraputty      Shoulder Exercises: Stretch   Corner Stretch 1 rep;20 seconds   doorway   Wall Stretch - Flexion 1 rep;20 seconds    Wall Stretch - ABduction 1 rep;20 seconds      Hand Exercises   Other Hand Exercises Placing clothespins along  top and left side of large pegboard with OT holding pegboard vertically, mod difficulty      Manual Therapy   Manual Therapy Taping    Manual therapy comments completed separately from all other aspects of treatment    Kinesiotex --   joint support and stability - wrist extension                 OT Education - 01/05/21 1354    Education Details shoulder stretches at door. Suggested using slide to close Ziploc bags versus pinch to close.    Person(s) Educated Patient    Methods Explanation;Demonstration;Handout;Verbal cues    Comprehension Verbalized understanding;Returned demonstration            OT Short Term Goals - 01/05/21 1403      OT SHORT TERM GOAL #1   Title Pt will be provided with  and educated on HEP to improve LUE use as non-dominant during ADL completion.    Time 3    Period Weeks    Target Date 12/23/20      OT SHORT TERM GOAL #2   Title Pt will increase LUE P/ROM to [facility] to improve mobility throughout LUE required to perform dressing and bathing tasks using LUE as assist.    Time 3    Period Weeks      OT SHORT TERM GOAL #3   Title Pt will decrease LUE fascial restrictions to moderate amount to improve mobility required for functional reaching tasks.    Time 3    Period Weeks    Status On-going      OT SHORT TERM GOAL #4   Title Pt will increase left elbow strength to 2/5 flexion and 3+/5 extension to improve ability to use LUE to assist during UB dressing tasks.    Time 3    Period Weeks    Status On-going      OT SHORT TERM GOAL #5   Title Pt will be provided with necessary splints and educated on wear and care of splints for LUE wrist stability and functional use.    Time 3    Period Weeks    Status On-going      OT SHORT TERM GOAL #6   Title Pt will be educated on and verbalize appropriate understanding and utilization of sensory reintegration strategies.    Time 3    Period Weeks             OT Long Term Goals - 12/09/20 0943      OT LONG TERM GOAL #1   Title Pt will decrease pain in LUE to 3/10 or less to improve ability to sleep for 3+ consecutive hours without waking due to pain.    Time 6    Period Weeks    Status On-going      OT LONG TERM GOAL #2   Title Pt will increase LUE A/ROM to [facility] to improve ability to use LUE as assist during eating and grooming tasks.    Time 6    Period Weeks    Status On-going      OT LONG TERM GOAL #3   Title Pt will increase LUE shoulder strength to 4/5 or greater to improve ability to participate in exercise routine.    Time 6    Period Weeks    Status On-going      OT LONG TERM GOAL #4   Title Pt will increase LUE elbow strength to 4-/5 flexion and 4/5  extension to improve ability to bring  washcloth to face.    Time 6    Period Weeks    Status On-going      OT LONG TERM GOAL #5   Title Pt will increase LUE wrist strength to 4/5 flexion and 3/5 extension to improve ability to turn items (keys, tops to jars, etc) during ADLs.    Time 6    Period Weeks    Status On-going      OT LONG TERM GOAL #6   Title Pt will increase LUE grip strength by 25# and pinch strength by 6# to improve ability to grasp and maintain hold on items during functional use.    Time 6    Period Weeks    Status On-going      OT LONG TERM GOAL #7   Title Pt will increase fine motor coordination by completing 9 hole peg test in under 30" to improve ability to manipulate small items during ADLs.    Time 6    Period Weeks    Status On-going                 Plan - 01/05/21 1357    Clinical Impression Statement A: Pt reports that his wrist cocl up splint was taken by the dog and is now chewed up. Provided kinsiotape to left wrist to stabilize his wrist in extension while completing fine motor tasks. Completed clothespin task per patient request to focus on fine motor coordination and shoulder stability. Shoulder stretches provided for HEP. Pt requested to be re-evaluated by Speech therapist and therapist assisted with appointment. Pt reports good carry over of stress management techniques. Pt demonstrates more ability to stabilize shoulder when elbow is extended while working towards stability with elbow in flexed position. No active wrist extension present. No thumb extension noted. Digits 2-5 demonstrate limited extension.    Body Structure / Function / Physical Skills ADL;Endurance;UE functional use;Fascial restriction;Flexibility;Pain;FMC;ROM;Coordination;Sensation;IADL;Strength;Mobility;Edema    Plan P: Pt will bring in splint he was provided at [facility] that we may be able to change to wrist cock up splint. If not, fabricate new wrist cock up splint. He may be able to use radial nerve dynamic splint  also.    OT Home Exercise Plan 12/30/20 coping strategies liest; stress management techniques; 1/27: sleep hygiene 1/31: shoulder stretches    Consulted and Agree with Plan of Care Patient           Patient will benefit from skilled therapeutic intervention in order to improve the following deficits and impairments:   Body Structure / Function / Physical Skills: ADL,Endurance,UE functional use,Fascial restriction,Flexibility,Pain,FMC,ROM,Coordination,Sensation,IADL,Strength,Mobility,Edema       Visit Diagnosis: Other symptoms and signs involving the nervous system  Stiffness of left shoulder, not elsewhere classified  Acute pain of left shoulder  Other lack of coordination  Other symptoms and signs involving the musculoskeletal system  Pain in left arm    Problem List Patient Active Problem List   Diagnosis Date Noted  . Critical polytrauma 11/15/2020  . Closed displaced comminuted fracture of shaft of left radius 10/29/2020  . Closed displaced comminuted fracture of shaft of left ulna 10/29/2020  . Displaced comminuted fracture of shaft of left femur, initial encounter for closed fracture (HCC) 10/29/2020  . Vitamin D insufficiency 10/29/2020  . 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 Closed displaced comminuted fracture of shaft of left humerus 10/27/2020  . Hypogonadism in male 08/15/2019  . Other male erectile dysfunction 11/08/2016  .  Depression 10/05/2016  . Current every day smoker 10/05/2016  . Weight gain, abnormal 10/05/2016  . Generalized anxiety disorder 03/01/2016   Limmie Patricia, OTR/L,CBIS  [phone]  01/05/2021, 2:13 PM  [facility] [address] [place], [place], [address] Phone: [phone]   Fax:  [phone]  Name: Jason Alexander MRN: [id number] Date of Birth: 1976-09-20

## 2021-01-06 ENCOUNTER — Encounter (HOSPITAL_COMMUNITY): Payer: Medicaid Other | Admitting: Occupational Therapy

## 2021-01-06 ENCOUNTER — Encounter (HOSPITAL_COMMUNITY): Payer: Medicaid Other

## 2021-01-06 DIAGNOSIS — Z419 Encounter for procedure for purposes other than remedying health state, unspecified: Secondary | ICD-10-CM | POA: Diagnosis not present

## 2021-01-07 ENCOUNTER — Telehealth: Payer: Self-pay

## 2021-01-07 ENCOUNTER — Other Ambulatory Visit: Payer: Self-pay | Admitting: Physical Medicine and Rehabilitation

## 2021-01-07 MED ORDER — NORTRIPTYLINE HCL 25 MG PO CAPS: 25.0000 mg | ORAL_CAPSULE | Freq: Every day | ORAL | 2 refills | Status: DC

## 2021-01-07 NOTE — Telephone Encounter (Signed)
Please let him know I have increased his dose to 25mg  and that I have presented a proposal for Ketamine treatment to our department and our leadership is deciding if this is something we can start offering. Thank you!

## 2021-01-07 NOTE — Telephone Encounter (Signed)
Pt called stating he has not seen a change since taking Pamelor 10 mg one at bedtime. Advise on increase. He also wanted to know if you found out any information on Ketamine treatment?

## 2021-01-08 ENCOUNTER — Ambulatory Visit (HOSPITAL_COMMUNITY): Payer: Medicaid Other | Attending: Physician Assistant | Admitting: Occupational Therapy

## 2021-01-08 ENCOUNTER — Encounter (HOSPITAL_COMMUNITY): Payer: Self-pay | Admitting: Physical Therapy

## 2021-01-08 ENCOUNTER — Other Ambulatory Visit: Payer: Self-pay

## 2021-01-08 ENCOUNTER — Encounter (HOSPITAL_COMMUNITY): Payer: Self-pay

## 2021-01-08 ENCOUNTER — Ambulatory Visit (HOSPITAL_COMMUNITY): Payer: Medicaid Other | Admitting: Speech Pathology

## 2021-01-08 ENCOUNTER — Ambulatory Visit (HOSPITAL_COMMUNITY): Payer: Medicaid Other | Admitting: Physical Therapy

## 2021-01-08 ENCOUNTER — Encounter (HOSPITAL_COMMUNITY): Payer: Self-pay | Admitting: Occupational Therapy

## 2021-01-08 DIAGNOSIS — R278 Other lack of coordination: Secondary | ICD-10-CM | POA: Insufficient documentation

## 2021-01-08 DIAGNOSIS — R2681 Unsteadiness on feet: Secondary | ICD-10-CM | POA: Insufficient documentation

## 2021-01-08 DIAGNOSIS — M79602 Pain in left arm: Secondary | ICD-10-CM | POA: Diagnosis not present

## 2021-01-08 DIAGNOSIS — M6281 Muscle weakness (generalized): Secondary | ICD-10-CM | POA: Insufficient documentation

## 2021-01-08 DIAGNOSIS — R29818 Other symptoms and signs involving the nervous system: Secondary | ICD-10-CM | POA: Diagnosis not present

## 2021-01-08 DIAGNOSIS — M25612 Stiffness of left shoulder, not elsewhere classified: Secondary | ICD-10-CM | POA: Insufficient documentation

## 2021-01-08 DIAGNOSIS — S72352A Displaced comminuted fracture of shaft of left femur, initial encounter for closed fracture: Secondary | ICD-10-CM | POA: Diagnosis not present

## 2021-01-08 DIAGNOSIS — R29898 Other symptoms and signs involving the musculoskeletal system: Secondary | ICD-10-CM | POA: Diagnosis not present

## 2021-01-08 DIAGNOSIS — R41841 Cognitive communication deficit: Secondary | ICD-10-CM | POA: Insufficient documentation

## 2021-01-08 DIAGNOSIS — R262 Difficulty in walking, not elsewhere classified: Secondary | ICD-10-CM | POA: Diagnosis not present

## 2021-01-08 DIAGNOSIS — M25512 Pain in left shoulder: Secondary | ICD-10-CM | POA: Insufficient documentation

## 2021-01-08 NOTE — Telephone Encounter (Signed)
Left message with information.

## 2021-01-08 NOTE — Therapy (Signed)
[facility] [address] [place], [place], [address] Phone: [phone]   Fax:  [phone]  Physical Therapy Treatment/Progress Note/Discharge Summary  Patient Details  Name: Jason Alexander MRN: [id number] Date of Birth: September 21, 1976 Referring Provider (PT): Ainsley Spinner   Encounter Date: 01/08/2021   Progress Note   Reporting Period 12/02/21 to 01/08/21   See note below for Objective Data and Assessment of Progress/Goals  PHYSICAL THERAPY DISCHARGE SUMMARY  Visits from Start of Care: 10  Current functional level related to goals / functional outcomes: See below   Remaining deficits: See below   Education / Equipment: See below  Plan: Patient agrees to discharge.  Patient goals were met. Patient is being discharged due to meeting the stated rehab goals.  ?????        PT End of Session - 01/08/21 1315    Visit Number 10    Number of Visits 12    Date for PT Re-Evaluation 01/13/21    Authorization Type Medicaid [employer], request 12 visits, check auth. 12 visits approved    Authorization Time Period 12/02/20-01/31/21    Authorization - Visit Number 10    Authorization - Number of Visits 12    Progress Note Due on Visit 10    PT Start Time 4259    PT Stop Time 1410    PT Time Calculation (min) 55 min    Activity Tolerance Patient tolerated treatment well    Behavior During Therapy WFL for tasks assessed/performed           Past Medical History:  Diagnosis Date  . Closed displaced comminuted fracture of shaft of left humerus 10/27/2020  . Closed displaced comminuted fracture of shaft of left radius 10/29/2020  . Closed displaced comminuted fracture of shaft of left ulna 10/29/2020  . Depression 2006   after divorce  . Displaced comminuted fracture of shaft of left femur, initial encounter for closed fracture ([place]) 10/29/2020  . 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 Vitamin D insufficiency 10/29/2020    Past Surgical  History:  Procedure Laterality Date  . APPENDECTOMY    . FEMUR IM NAIL Left 10/28/2020   Procedure: INTRAMEDULLARY (IM) RETROGRADE FEMORAL NAILING;  Surgeon: Altamese Istachatta, MD;  Location: [place] [facility];  Service: Orthopedics;  Laterality: Left;  . NASAL SINUS SURGERY     polyp removal  . ORIF HUMERUS FRACTURE Left 10/28/2020   Procedure: OPEN REDUCTION INTERNAL FIXATION (ORIF) HUMERAL SHAFT FRACTURE;  Surgeon: Altamese Olivette, MD;  Location: [facility];  Service: Orthopedics;  Laterality: Left;  . ORIF RADIAL FRACTURE Left 10/28/2020   Procedure: OPEN REDUCTION INTERNAL FIXATION (ORIF) RADIAL FRACTURE;  Surgeon: Altamese Borger, MD;  Location: [place];  Service: Orthopedics;  Laterality: Left;    There were no vitals filed for this visit.   Subjective Assessment - 01/08/21 1316    Subjective Patient states he is doing good. He continues to have knee pain/discomfort. The aquatic therapy was helpful and he feels he got more motion in his arm. He has been walking alot. He is still having trouble with balance still. Patient states 70-75% improvment since beginning therapy. His home exercises are going well.    Currently in Pain? No/denies              [facility] PT Assessment - 01/08/21 0001      Assessment   Medical Diagnosis left femur fracture s/p IM nailing    Referring Provider (PT) Ainsley Spinner    Onset  Date/Surgical Date 10/27/20    Prior Therapy inpatient rehab 12/11-12/18/21      Restrictions   Weight Bearing Restrictions Yes    LLE Weight Bearing Weight bearing as tolerated      Home Environment   Living Environment Private residence    Living Arrangements Other relatives    Available Help at Discharge Family    Type of [place] to enter    Entrance Stairs-Number of Steps [place] One level    [place] - 2 wheels   left platform     Prior Function   Level of Independence Independent    Vocation Full time  employment      AROM   Right Hip Flexion 130    Left Hip Flexion 118      Strength   Overall Strength Comments LLE not tested due to pain/recent surgery    Left Knee Flexion 5/5    Left Knee Extension 4+/5      Bed Mobility   Supine to Sit Independent    Sit to Supine Independent      Transfers   Transfers Sit to Stand;Stand Pivot Transfers    Sit to Stand 7: Independent      Ambulation/Gait   Ambulation/Gait Yes    Ambulation/Gait Assistance 7: Independent    Ambulation Distance (Feet) 375 Feet    Assistive device None    Gait Pattern Decreased stance time - left;Antalgic;Step-through pattern    Gait velocity decreased    Stairs Yes    Stairs Assistance 5: Supervision;6: Modified independent (Device/Increase time)    Stair Management Technique Alternating pattern;One rail Right    Number of Stairs 4    Height of Stairs 7    Gait Comments 2MWT                         [facility] Adult PT Treatment/Exercise - 01/08/21 0001      Manual Therapy   Manual Therapy Joint mobilization    Manual therapy comments completed separately from all other aspects of treatment    Joint Mobilization inferior patellar mobs, self patellar mobs                  PT Education - 01/08/21 1322    Education Details Patient educated on HEP, exercise mechanics, progress made, reassessment findings, joining gym, returning to PT if needed    Person(s) Educated Patient    Methods Explanation;Demonstration;Handout    Comprehension Verbalized understanding;Returned demonstration            PT Short Term Goals - 01/08/21 1342      PT SHORT TERM GOAL #1   Title Patient will report at least 25% improvement in symptoms for improved quality of life.    Time 3    Period Weeks    Status Achieved    Target Date 12/23/20      PT SHORT TERM GOAL #2   Title Patient will be independent with HEP in order to improve functional outcomes.    Baseline in development    Time 3     Period Weeks    Status Achieved    Target Date 12/23/20      PT SHORT TERM GOAL #3   Title Patient will demonstrate left hip flexion AROM to 90 degrees to improve functional mobility    Baseline 118 AROM  in supine    Time 3    Period Weeks    Status Achieved    Target Date 12/23/20             PT Long Term Goals - 01/08/21 1343      PT LONG TERM GOAL #1   Title Patient will report at least 50% improvement in overall symptoms and function to demonstrate overall improved functional ability    Time 6    Period Weeks    Status Achieved      PT LONG TERM GOAL #2   Title Patient will demonstrate improved functional ambulation as evidenced by distance of 350 ft during 2MWT using least restrictive AD    Baseline 110 ft with left platform RW    Time 6    Period Weeks    Status Achieved      PT LONG TERM GOAL #3   Title Patient will be able to navigate stairs with reciprocal pattern without compensation in order to demonstrate improved LE strength.    Baseline impaired eccentric control, requires UE support    Time 6    Period Weeks    Status On-going                 Plan - 01/08/21 1315    Clinical Impression Statement Patient has met 3/3 short term goals and 2/3 long term goals with ability to complete HEP and improved strength, symptoms, ROM, gait, balance, and functional mobility. Patient continues to remain limited by strength and balance deficits. Patient highly motivated to regain function and strength to improve overall mobility. Reviewed HEP with patient and gave him additional LE strength and balance exercises that were previously performed. Patient educated on progress made, continuing HEP, and returning to physical therapy if needed. Patient given [employer] handout and will hopefully join as he states he wants to join to get stronger. Patient discharged from physical therapy at this time.    Personal Factors and Comorbidities Behavior Pattern;Comorbidity 3+     Comorbidities left humerus fracture, radial nerve injury, left radius/ulna fracture s/p ORIF    Examination-Activity Limitations Bed Mobility;Bend;Carry;Dressing;Lift;Toileting;Stand;Stairs;Squat;Sit;Transfers;Locomotion Level    Examination-Participation Restrictions Cleaning;Community Activity;Driving;Laundry;Yard Work;Shop;Occupation;Meal Prep    Rehab Potential Good    PT Frequency --    PT Duration --    PT Treatment/Interventions ADLs/Self Care Home Management;Aquatic Therapy;Biofeedback;Cryotherapy;Electrical Stimulation;DME Instruction;Ultrasound;Traction;Moist Heat;Gait training;Stair training;Functional mobility training;Therapeutic activities;Therapeutic exercise;Balance training;Patient/family education;Neuromuscular re-education;Wheelchair mobility training;Manual techniques;Manual lymph drainage;Compression bandaging;Passive range of motion;Taping;Splinting;Energy conservation;Dry needling;Spinal Manipulations;Joint Manipulations    PT Next Visit Plan n/a    PT Home Exercise Plan SLS with vectors 2/3 inferior patellar mobs, fwd and lateral step down, STS, squat, tandem stance, SLS with vectors    Consulted and Agree with Plan of Care Patient           Patient will benefit from skilled therapeutic intervention in order to improve the following deficits and impairments:  Abnormal gait,Decreased activity tolerance,Decreased balance,Decreased mobility,Decreased knowledge of use of DME,Decreased knowledge of precautions,Decreased endurance,Decreased coordination,Decreased range of motion,Decreased safety awareness,Decreased strength,Increased edema,Difficulty walking,Impaired perceived functional ability,Improper body mechanics,Impaired UE functional use,Pain  Visit Diagnosis: Displaced comminuted fracture of shaft of left femur, initial encounter for closed fracture (HCC)  Muscle weakness (generalized)  Difficulty in walking, not elsewhere classified  Unsteadiness on  feet     Problem List Patient Active Problem List   Diagnosis Date Noted  . Critical polytrauma 11/15/2020  . Closed displaced comminuted fracture of shaft of left radius 10/29/2020  .  Closed displaced comminuted fracture of shaft of left ulna 10/29/2020  . Displaced comminuted fracture of shaft of left femur, initial encounter for closed fracture ([place]) 10/29/2020  . Vitamin D insufficiency 10/29/2020  . 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 Closed displaced comminuted fracture of shaft of left humerus 10/27/2020  . Hypogonadism in male 08/15/2019  . Other male erectile dysfunction 11/08/2016  . Depression 10/05/2016  . Current every day smoker 10/05/2016  . Weight gain, abnormal 10/05/2016  . Generalized anxiety disorder 03/01/2016    2:32 PM, 01/08/21 Mearl Latin PT, DPT Physical Therapist at [address] [place], [place], [address] Phone: [phone]   Fax:  [phone]  Name: Donyel Nester MRN: [id number] Date of Birth: 01/27/1976
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
## 2021-01-08 NOTE — Patient Instructions (Signed)
Repeat all exercises 10-15 times, 1-2 times per day.  1) Shoulder Protraction    Begin with elbows by your side, slowly "punch" straight out in front of you.      2) Shoulder Flexion  Standing:         Begin with arms at your side with thumbs pointed up, slowly raise both arms up and forward towards overhead.               3) Horizontal abduction/adduction  Standing:           Begin with arms straight out in front of you, bring out to the side in at "T" shape. Keep arms straight entire time.                 4) Internal & External Rotation  Standing:     Stand with elbows at the side and elbows bent 90 degrees. Move your forearms away from your body, then bring back inward toward the body.     5) Shoulder Abduction  Standing:       Lying on your back begin with your arms flat on the table next to your side. Slowly move your arms out to the side so that they go overhead, in a jumping jack or snow angel movement.        Complete exercises 10-15 times each, 2-3 times per day. 1) Elbow flexion and extension Bend your elbow upwards as shown and then lower to a straighten position.     2) Forearm supination and pronation Hold elbow at a right angle stabilizing on a table or armrest. Keep elbow at side and turn palm up and down.

## 2021-01-08 NOTE — Patient Instructions (Signed)
Access Code: [email or web address] URL: [email or web address] Date: 01/08/2021 Prepared by: Greig Castilla Danh Bayus  Exercises Lateral Step Down - 1 x daily - 7 x weekly - 2 sets - 10 reps Forward Step Down - 1 x daily - 7 x weekly - 2 sets - 10 reps Single Leg Balance with Clock Reach - 1 x daily - 7 x weekly - 5 reps - 5 second hold Sit to Stand with Arms Crossed - 1 x daily - 7 x weekly - 3 sets - 10 reps Mini Squat with Counter Support - 1 x daily - 7 x weekly - 2 sets - 10 reps Tandem Stance - 1 x daily - 7 x weekly - 3 reps - 30 second hold Single Leg Stance - 1 x daily - 7 x weekly - 3 reps - 30 second hold Access Code: [email or web address] URL: [email or web address] Date: 01/08/2021 Prepared by: Greig Castilla Shaquoia Miers  Exercises Seated Inferior Patellar Glide - 1 x daily - 7 x weekly

## 2021-01-12 ENCOUNTER — Other Ambulatory Visit: Payer: Self-pay

## 2021-01-12 ENCOUNTER — Encounter (HOSPITAL_COMMUNITY): Payer: Self-pay | Admitting: Speech Pathology

## 2021-01-12 ENCOUNTER — Ambulatory Visit (HOSPITAL_COMMUNITY): Payer: Medicaid Other | Admitting: Physical Therapy

## 2021-01-12 ENCOUNTER — Ambulatory Visit (HOSPITAL_COMMUNITY): Payer: Medicaid Other | Admitting: Speech Pathology

## 2021-01-12 DIAGNOSIS — R29818 Other symptoms and signs involving the nervous system: Secondary | ICD-10-CM | POA: Diagnosis not present

## 2021-01-12 DIAGNOSIS — R41841 Cognitive communication deficit: Secondary | ICD-10-CM

## 2021-01-12 DIAGNOSIS — R278 Other lack of coordination: Secondary | ICD-10-CM | POA: Diagnosis not present

## 2021-01-12 DIAGNOSIS — M25612 Stiffness of left shoulder, not elsewhere classified: Secondary | ICD-10-CM | POA: Diagnosis not present

## 2021-01-12 DIAGNOSIS — R29898 Other symptoms and signs involving the musculoskeletal system: Secondary | ICD-10-CM | POA: Diagnosis not present

## 2021-01-12 DIAGNOSIS — M79602 Pain in left arm: Secondary | ICD-10-CM | POA: Diagnosis not present

## 2021-01-12 DIAGNOSIS — M25512 Pain in left shoulder: Secondary | ICD-10-CM | POA: Diagnosis not present

## 2021-01-12 DIAGNOSIS — R262 Difficulty in walking, not elsewhere classified: Secondary | ICD-10-CM | POA: Diagnosis not present

## 2021-01-12 DIAGNOSIS — S72352A Displaced comminuted fracture of shaft of left femur, initial encounter for closed fracture: Secondary | ICD-10-CM | POA: Diagnosis not present

## 2021-01-12 DIAGNOSIS — M6281 Muscle weakness (generalized): Secondary | ICD-10-CM | POA: Diagnosis not present

## 2021-01-12 DIAGNOSIS — R2681 Unsteadiness on feet: Secondary | ICD-10-CM | POA: Diagnosis not present

## 2021-01-12 NOTE — Addendum Note (Signed)
Addended by: Dorene Ar on: 01/12/2021 01:59 PM   Modules accepted: Orders
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
## 2021-01-13 ENCOUNTER — Encounter (HOSPITAL_COMMUNITY): Payer: Medicaid Other

## 2021-01-13 ENCOUNTER — Encounter (HOSPITAL_COMMUNITY): Payer: Self-pay

## 2021-01-13 ENCOUNTER — Ambulatory Visit (HOSPITAL_COMMUNITY): Payer: Medicaid Other

## 2021-01-13 DIAGNOSIS — R262 Difficulty in walking, not elsewhere classified: Secondary | ICD-10-CM | POA: Diagnosis not present

## 2021-01-13 DIAGNOSIS — R29818 Other symptoms and signs involving the nervous system: Secondary | ICD-10-CM

## 2021-01-13 DIAGNOSIS — R29898 Other symptoms and signs involving the musculoskeletal system: Secondary | ICD-10-CM | POA: Diagnosis not present

## 2021-01-13 DIAGNOSIS — M6281 Muscle weakness (generalized): Secondary | ICD-10-CM | POA: Diagnosis not present

## 2021-01-13 DIAGNOSIS — M25512 Pain in left shoulder: Secondary | ICD-10-CM

## 2021-01-13 DIAGNOSIS — M25612 Stiffness of left shoulder, not elsewhere classified: Secondary | ICD-10-CM | POA: Diagnosis not present

## 2021-01-13 DIAGNOSIS — M79602 Pain in left arm: Secondary | ICD-10-CM | POA: Diagnosis not present

## 2021-01-13 DIAGNOSIS — R2681 Unsteadiness on feet: Secondary | ICD-10-CM | POA: Diagnosis not present

## 2021-01-13 DIAGNOSIS — R278 Other lack of coordination: Secondary | ICD-10-CM | POA: Diagnosis not present

## 2021-01-13 DIAGNOSIS — S72352A Displaced comminuted fracture of shaft of left femur, initial encounter for closed fracture: Secondary | ICD-10-CM | POA: Diagnosis not present

## 2021-01-13 DIAGNOSIS — R41841 Cognitive communication deficit: Secondary | ICD-10-CM | POA: Diagnosis not present

## 2021-01-13 NOTE — Therapy (Signed)
[place], [place], [address] Phone: [phone]   Fax:  [phone]  Occupational Therapy Treatment Reassessment/re-cert Patient Details  Name: Jason Alexander MRN: [id number] Date of Birth: 03/21/1976 Referring Provider (OT): Lauraine Rinne, PA-C   Encounter Date: 01/13/2021   OT End of Session - 01/13/21 1559    Visit Number 11    Number of Visits 18    Date for OT Re-Evaluation 02/03/21    Authorization Type [employer]    Authorization Time Period 27 visit limit combined PT/OT/SP; 9 visits approved 12/04/20-02/02/21    Authorization - Visit Number 6    Authorization - Number of Visits 9    OT Start Time 1350   reassessment   OT Stop Time 1520    OT Time Calculation (min) 90 min    Activity Tolerance Patient tolerated treatment well    Behavior During Therapy [facility] for tasks assessed/performed           Past Medical History:  Diagnosis Date  . Closed displaced comminuted fracture of shaft of left humerus 10/27/2020  . Closed displaced comminuted fracture of shaft of left radius 10/29/2020  . Closed displaced comminuted fracture of shaft of left ulna 10/29/2020  . Depression 2006   after divorce  . Displaced comminuted fracture of shaft of left femur, initial encounter for closed fracture ([place]) 10/29/2020  . 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 Vitamin D insufficiency 10/29/2020    Past Surgical History:  Procedure Laterality Date  . APPENDECTOMY    . FEMUR IM NAIL Left 10/28/2020   Procedure: INTRAMEDULLARY (IM) RETROGRADE FEMORAL NAILING;  Surgeon: Altamese San Perlita, MD;  Location: [place];  Service: Orthopedics;  Laterality: Left;  . NASAL SINUS SURGERY     polyp removal  . ORIF HUMERUS FRACTURE Left 10/28/2020   Procedure: OPEN REDUCTION INTERNAL FIXATION (ORIF) HUMERAL SHAFT FRACTURE;  Surgeon: Altamese Betsy Layne, MD;  Location: [place];  Service: Orthopedics;  Laterality: Left;  . ORIF RADIAL  FRACTURE Left 10/28/2020   Procedure: OPEN REDUCTION INTERNAL FIXATION (ORIF) RADIAL FRACTURE;  Surgeon: Altamese Montour, MD;  Location: [place];  Service: Orthopedics;  Laterality: Left;    There were no vitals filed for this visit.   Subjective Assessment - 01/13/21 1354    Subjective  S: I'm starting to some type of pain in the arm. It's like it burns.    Currently in Pain? Yes    Pain Score 6     Pain Location Arm    Pain Orientation Left    Pain Descriptors / Indicators Burning    Pain Type Acute pain    Pain Onset 1 to 4 weeks ago    Pain Frequency Occasional    Aggravating Factors  stress/strain to the left arm. sometimes no reason    Pain Relieving Factors Nothing because of how short lived pain.    Effect of Pain on Daily Activities limited use of LUE for daily tasks.    Multiple Pain Sites No              [facility] OT Assessment - 01/13/21 1356      Assessment   Medical Diagnosis s/p left humerus ORIF, left ulna and radius ORIF, radius grafting, exploration of radial nerve    Referring Provider (OT) Lauraine Rinne, PA-C    Onset Date/Surgical Date 10/27/20      Prior Function   Level of Independence Independent      Sensation  Light Touch Impaired by gross assessment    Stereognosis Impaired by gross assessment    Hot/Cold Impaired by gross assessment    Additional Comments Reports continuous impaired sensation in left UE. Experiencing possible nerve pain with reports of burning sensation in the left lateral and medial epicondyle areas and volar aspect of forearm.      Coordination   9 Hole Peg Test Left    Left 9 Hole Peg Test 51.4"   previous: 1'35"     ROM / Strength   AROM / PROM / Strength PROM;AROM;Strength      Palpation   Palpation comment mod fascial restrictions palpated in left upper trapezius and upper arm region.      AROM   Overall AROM Comments Assessed seated, er/IR adducted. Wrist and forearm strength assessed with arm supported on tray table  and forearm in nuetral position.    AROM Assessment Site Shoulder    Right/Left Shoulder Left    Left Shoulder Flexion 160 Degrees   previous: 67   Left Shoulder ABduction 160 Degrees   previous: 55   Left Shoulder Internal Rotation 90 Degrees   previous: same   Left Shoulder External Rotation 70 Degrees   previous: 0   Right/Left Elbow Left    Left Elbow Flexion 107   previous: 0   Left Elbow Extension --   WNL   Right/Left Forearm Left    Left Forearm Supination 0 Degrees   previous: 60   Right/Left Wrist Left    Left Wrist Extension 0 Degrees   previous: same   Left Wrist Flexion 82 Degrees   previous: 50 (Both measured with gravity eliminated)   Left Wrist Radial Deviation 0 Degrees   previous: same   Left Wrist Ulnar Deviation 0 Degrees   previous: same     PROM   Overall PROM  Within functional limits for tasks performed    Overall PROM Comments shoulder, elbow, wrist, and hand P/ROM is functional.      Strength   Overall Strength Comments Assessed seated. IR/er adducted. Wrist and forearm strength assessed with arm supported on tray table and forearm in nuetral position.    Strength Assessment Site Shoulder;Elbow;Forearm;Wrist;Hand    Right/Left Shoulder Left    Left Shoulder Flexion 4-/5   previous: 3-/5   Left Shoulder ABduction 4+/5   previous: 3-/5   Left Shoulder Internal Rotation 4/5   previous: 3/5   Left Shoulder External Rotation 3/5   previous: 0/5   Right/Left Elbow Left    Left Elbow Flexion 3/5   previous: 1/5   Left Elbow Extension 5/5   previous: 3-/5   Right/Left Forearm Left    Left Forearm Pronation 3/5   previous: 2/5   Left Forearm Supination 5/5   previous: 2/5   Right/Left Wrist Left    Left Wrist Flexion 5/5   previous: 3/5   Left Wrist Extension 1/5   previous: 0/5   Left Wrist Radial Deviation 0/5   previous: same   Left Wrist Ulnar Deviation 0/5   previous: same   Right/Left hand Left    Left Hand Grip (lbs) 30   previous: 20   Left Hand  Lateral Pinch 10 lbs   previous:8   Left Hand 3 Point Pinch 8 lbs   previous: 4             Quick Dash - 01/13/21 0001    Open a tight or new jar Unable    Do  heavy household chores (wash walls, wash floors) Severe difficulty    Carry a shopping bag or briefcase Severe difficulty    Wash your back Unable    Use a knife to cut food Unable    Recreational activities in which you take some force or impact through your arm, shoulder, or hand (golf, hammering, tennis) Severe difficulty    During the past week, to what extent has your arm, shoulder or hand problem interfered with your normal social activities with family, friends, neighbors, or groups? Quite a bit    During the past week, to what extent has your arm, shoulder or hand problem limited your work or other regular daily activities Extremely    Arm, shoulder, or hand pain. Moderate    Tingling (pins and needles) in your arm, shoulder, or hand Mild    Difficulty Sleeping Mild difficulty    DASH Score 72.73 %                OT Treatments/Exercises (OP) - 01/13/21 1545      Modalities   Modalities Cryotherapy      Cryotherapy   Number Minutes Cryotherapy 3 Minutes    Cryotherapy Location Other (comment)   left elbow   Type of Cryotherapy Ice massage      Manual Therapy   Manual Therapy Taping;Other (comment)    Manual therapy comments completed separately from all other aspects of treatment    Other Manual Therapy Kinsiotape applied to left lateral epicondyle due to presesentation of possible tennis elbow. 2 strips applied. 1st strip: button hole strip with index and middle finger through holes at base. paper off tension used. Distal to proximal along the wrist extensors with elbow in extension, wrist flexed and pronated. 2nd strip: Y strip used with tails surrounding point of pain. 50% tension used with upper and lower tails.    Kinesiotex Inhibit Muscle                  OT Education - 01/13/21 1557     Education Details Reviewed progress in therapy. Reviewed goals. Discussed possible lateral elbow pain presenting as tennis elbow due to the left wrist being in a constant flexed position while also attempting to use his hand to pick up, grab, and lift items. All this causing increased strain to the left wrist extensors. Kinsiotape applied to address pain. Ice massage was also completed for pain. Currently waiting for new brace to arrive in mail.    Person(s) Educated Patient    Methods Explanation    Comprehension Verbalized understanding            OT Short Term Goals - 01/13/21 1412      OT SHORT TERM GOAL #1   Title Pt will be provided with and educated on HEP to improve LUE use as non-dominant during ADL completion.    Time 3    Period Weeks    Target Date 12/23/20      OT SHORT TERM GOAL #2   Title Pt will increase LUE P/ROM to [facility] to improve mobility throughout LUE required to perform dressing and bathing tasks using LUE as assist.    Time 3    Period Weeks      OT SHORT TERM GOAL #3   Title Pt will decrease LUE fascial restrictions to moderate amount to improve mobility required for functional reaching tasks.    Time 3    Period Weeks    Status Achieved  OT SHORT TERM GOAL #4   Title Pt will increase left elbow strength to 2/5 flexion and 3+/5 extension to improve ability to use LUE to assist during UB dressing tasks.    Time 3    Period Weeks    Status Achieved      OT SHORT TERM GOAL #5   Title Pt will be provided with necessary splints and educated on wear and care of splints for LUE wrist stability and functional use.    Time 3    Period Weeks    Status On-going      OT SHORT TERM GOAL #6   Title Pt will be educated on and verbalize appropriate understanding and utilization of sensory reintegration strategies.    Time 3    Period Weeks             OT Long Term Goals - 01/13/21 1449      OT LONG TERM GOAL #1   Title Pt will decrease pain in LUE to  3/10 or less to improve ability to sleep for 3+ consecutive hours without waking due to pain.    Baseline 2/8: pt reports no pain that is waking him up at night. He is able to sleep through the night without waking due to pain. He is having periodic nerve pain during the day.    Time 6    Period Weeks    Status Achieved      OT LONG TERM GOAL #2   Title Pt will increase LUE A/ROM to [facility] to improve ability to use LUE as assist during eating and grooming tasks.    Time 6    Period Weeks    Status On-going      OT LONG TERM GOAL #3   Title Pt will increase LUE shoulder strength to 4/5 or greater to improve ability to participate in exercise routine.    Time 6    Period Weeks    Status Achieved      OT LONG TERM GOAL #4   Title Pt will increase LUE elbow strength to 4-/5 flexion and 4/5 extension to improve ability to bring washcloth to face.    Time 6    Period Weeks    Status Partially Met      OT LONG TERM GOAL #5   Title Pt will increase LUE wrist strength to 4/5 flexion and 3/5 extension to improve ability to turn items (keys, tops to jars, etc) during ADLs.    Time 6    Period Weeks    Status On-going      OT LONG TERM GOAL #6   Title Pt will increase LUE grip strength by 25# and pinch strength by 6# to improve ability to grasp and maintain hold on items during functional use.    Time 6    Period Weeks    Status On-going      OT LONG TERM GOAL #7   Title Pt will increase fine motor coordination by completing 9 hole peg test in under 30" to improve ability to manipulate small items during ADLs.    Time 6    Period Weeks    Status On-going                 Plan - 01/13/21 1606    Clinical Impression Statement A: reassessment completed this date. patient has met 5/6 STGs and 2/7LTGs at this time. He is demonstrating increased strength and A/ROM in the LUE. He has  experienced the majority of his gains in the left shoulder and elbow. He is able to complete active elbow  flexion only with his shoulder adducted and forearm pronated. He does not have the capability to supinate his forearm actively to attempt elbow flexion at this time. Radial nerve injury continues to limit his ability to complete active wrist extension which also inhibits his ability to functionally use his left hand to grip, pinch and pick up items. Therapy did fabricate a wrist cock up splint to provide the needed wrist extension in order to complete functional tasks with his left hand. He reports it was taken by the dog at home and chewed up. A new wrist brace has been order to provide wrist support and dynamic finger extension. It is currently waiting to be delivered and should be here next session. His DASH score has flucuated since start of therapy although this session his score has improved from last reassessment and inital evaluation. He reports that he is not only completing provided HEP but he also attempting to use his left UE as much as possible to complete daily tasks. Due to lack of active wrist extension and his attempts to pick up items and grip with his wrist in a flexed position, he is presenting with symptoms similar to tennis elbow. See education section. Patient will continue to benefit from skilled OT services to focus on LUE A/ROM, strength, coordination, hand strength, and education for adaptive equipment and/or splints if needed to continue to increase his ability to utilize his LUE for daily tasks and increase his independence with use.    Body Structure / Function / Physical Skills ADL;Endurance;UE functional use;Fascial restriction;Flexibility;Pain;FMC;ROM;Coordination;Sensation;IADL;Strength;Mobility;Edema    OT Frequency 2x / week    OT Duration Other (comment)   3 weeks   OT Treatment/Interventions Self-care/ADL training;Ultrasound;Scar mobilization;Patient/family education;Passive range of motion;Cryotherapy;Electrical Stimulation;Splinting;Moist Heat;Therapeutic exercise;Manual  Therapy;Therapeutic activities;Neuromuscular education    Plan P: Continue skilled OT services 2x a week for 3 weeks (7 visits total due to insurnace limit) to focus on mentioned deficits. Next session: Fit for prefab splint if it has arrived. With splint on: attempt UBE bike and bodycraft system to assess if safe to use at [facility] during workout. Attempt NMES on LUE (forearm supination, wrist extension) Due to possible onset of tennis elbow symptoms due not complete any gripping/pinching or lifting with wrist flexed and/or arm extended (wear appropriate brace when completing any gripping/pinching). When possible, patient should always lift with arm supinated so work on this motion and ability to hold elbow flexed at 90 degrees with shoulder adducted. Request 4 more OT visits from insurance when Plan of care is signed.    Consulted and Agree with Plan of Care Patient           Patient will benefit from skilled therapeutic intervention in order to improve the following deficits and impairments:   Body Structure / Function / Physical Skills: ADL,Endurance,UE functional use,Fascial restriction,Flexibility,Pain,FMC,ROM,Coordination,Sensation,IADL,Strength,Mobility,Edema       Visit Diagnosis: Other symptoms and signs involving the musculoskeletal system - Plan: Ot plan of care cert/re-cert  Other lack of coordination - Plan: Ot plan of care cert/re-cert  Acute pain of left shoulder - Plan: Ot plan of care cert/re-cert  Stiffness of left shoulder, not elsewhere classified - Plan: Ot plan of care cert/re-cert  Other symptoms and signs involving the nervous system - Plan: Ot plan of care cert/re-cert  Pain in left arm - Plan: Ot plan of care cert/re-cert    Problem  List Patient Active Problem List   Diagnosis Date Noted  . Critical polytrauma 11/15/2020  . Closed displaced comminuted fracture of shaft of left radius 10/29/2020  . Closed displaced comminuted fracture of shaft of left ulna  10/29/2020  . Displaced comminuted fracture of shaft of left femur, initial encounter for closed fracture ([place]) 10/29/2020  . Vitamin D insufficiency 10/29/2020  . 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 Closed displaced comminuted fracture of shaft of left humerus 10/27/2020  . Hypogonadism in male 08/15/2019  . Other male erectile dysfunction 11/08/2016  . Depression 10/05/2016  . Current every day smoker 10/05/2016  . Weight gain, abnormal 10/05/2016  . Generalized anxiety disorder 03/01/2016   Ailene Ravel, OTR/L,CBIS  [phone]  01/13/2021, 4:25 PM  [place] [address] [place], [place], [address] Phone: [phone]   Fax:  [phone]  Name: Jason Alexander MRN: [id number] Date of Birth: 1976/01/12

## 2021-01-15 ENCOUNTER — Ambulatory Visit (HOSPITAL_COMMUNITY): Payer: Medicaid Other | Admitting: Speech Pathology

## 2021-01-15 ENCOUNTER — Encounter (HOSPITAL_COMMUNITY): Payer: Self-pay | Admitting: Speech Pathology

## 2021-01-15 ENCOUNTER — Encounter (HOSPITAL_COMMUNITY): Payer: Self-pay | Admitting: Occupational Therapy

## 2021-01-15 ENCOUNTER — Other Ambulatory Visit: Payer: Self-pay

## 2021-01-15 ENCOUNTER — Ambulatory Visit (HOSPITAL_COMMUNITY): Payer: Medicaid Other | Admitting: Occupational Therapy

## 2021-01-15 ENCOUNTER — Encounter (HOSPITAL_COMMUNITY): Payer: Medicaid Other | Admitting: Physical Therapy

## 2021-01-15 DIAGNOSIS — S72352A Displaced comminuted fracture of shaft of left femur, initial encounter for closed fracture: Secondary | ICD-10-CM | POA: Diagnosis not present

## 2021-01-15 DIAGNOSIS — R2681 Unsteadiness on feet: Secondary | ICD-10-CM | POA: Diagnosis not present

## 2021-01-15 DIAGNOSIS — R29898 Other symptoms and signs involving the musculoskeletal system: Secondary | ICD-10-CM | POA: Diagnosis not present

## 2021-01-15 DIAGNOSIS — R29818 Other symptoms and signs involving the nervous system: Secondary | ICD-10-CM | POA: Diagnosis not present

## 2021-01-15 DIAGNOSIS — M25612 Stiffness of left shoulder, not elsewhere classified: Secondary | ICD-10-CM | POA: Diagnosis not present

## 2021-01-15 DIAGNOSIS — M25512 Pain in left shoulder: Secondary | ICD-10-CM | POA: Diagnosis not present

## 2021-01-15 DIAGNOSIS — R278 Other lack of coordination: Secondary | ICD-10-CM | POA: Diagnosis not present

## 2021-01-15 DIAGNOSIS — R41841 Cognitive communication deficit: Secondary | ICD-10-CM

## 2021-01-15 DIAGNOSIS — M6281 Muscle weakness (generalized): Secondary | ICD-10-CM | POA: Diagnosis not present

## 2021-01-15 DIAGNOSIS — M79602 Pain in left arm: Secondary | ICD-10-CM | POA: Diagnosis not present

## 2021-01-15 DIAGNOSIS — R262 Difficulty in walking, not elsewhere classified: Secondary | ICD-10-CM | POA: Diagnosis not present
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
## 2021-01-15 NOTE — Therapy (Signed)
[place], [place], [address] Phone: [phone]   Fax:  [phone]  Occupational Therapy Treatment  Patient Details  Name: Jason Alexander MRN: [id number] Date of Birth: [birth year] Referring Provider (OT): Lauraine Rinne, PA-C   Encounter Date: 01/15/2021   OT End of Session - 01/15/21 1458    Visit Number 12    Number of Visits 18    Date for OT Re-Evaluation 02/03/21    Authorization Type [facility] Medicaid    Authorization Time Period 27 visit limit combined PT/OT/SP; 9 visits approved 12/04/20-02/02/21; Requesting 4 additional visits    Authorization - Visit Number 7    Authorization - Number of Visits 9    OT Start Time 1194    OT Stop Time 1501    OT Time Calculation (min) 73 min    Activity Tolerance Patient tolerated treatment well    Behavior During Therapy WFL for tasks assessed/performed           Past Medical History:  Diagnosis Date  . Closed displaced comminuted fracture of shaft of left humerus 10/27/2020  . Closed displaced comminuted fracture of shaft of left radius 10/29/2020  . Closed displaced comminuted fracture of shaft of left ulna 10/29/2020  . Depression 2006   after divorce  . Displaced comminuted fracture of shaft of left femur, initial encounter for closed fracture ([place]) 10/29/2020  . 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 Vitamin D insufficiency 10/29/2020    Past Surgical History:  Procedure Laterality Date  . APPENDECTOMY    . FEMUR IM NAIL Left 10/28/2020   Procedure: INTRAMEDULLARY (IM) RETROGRADE FEMORAL NAILING;  Surgeon: Altamese Reeds Spring, MD;  Location: [facility];  Service: Orthopedics;  Laterality: Left;  . NASAL SINUS SURGERY     polyp removal  . ORIF HUMERUS FRACTURE Left 10/28/2020   Procedure: OPEN REDUCTION INTERNAL FIXATION (ORIF) HUMERAL SHAFT FRACTURE;  Surgeon: Altamese Kewanna, MD;  Location: [facility];  Service: Orthopedics;  Laterality: Left;  . ORIF RADIAL  FRACTURE Left 10/28/2020   Procedure: OPEN REDUCTION INTERNAL FIXATION (ORIF) RADIAL FRACTURE;  Surgeon: Altamese Cecil, MD;  Location: [facility];  Service: Orthopedics;  Laterality: Left;    There were no vitals filed for this visit.   Subjective Assessment - 01/15/21 1410    Subjective  S: I've been going to the [facility] every day.    Currently in Pain? No/denies              [facility] OT Assessment - 01/15/21 1410      Assessment   Medical Diagnosis s/p left humerus ORIF, left ulna and radius ORIF, radius grafting, exploration of radial nerve      Precautions   Type of Shoulder Precautions Progress as tolerated. No orthopedic precautions                    OT Treatments/Exercises (OP) - 01/15/21 1410      Exercises   Exercises Shoulder;Elbow;Wrist;Hand;Theraputty      Shoulder Exercises: Seated   Protraction Strengthening;10 reps    Protraction Weight (lbs) 1    Horizontal ABduction AROM;10 reps    Flexion AROM;Strengthening;5 reps    Flexion Weight (lbs) 1    Abduction AROM;10 reps      Shoulder Exercises: Standing   Extension Theraband;10 reps    Theraband Level (Shoulder Extension) Level 2 (Red)    Row Theraband;10 reps    Theraband Level (Shoulder Row) Level 2 (Red)  Shoulder Exercises: Therapy Ball   Other Therapy Ball Exercises Wearing radial nerve palsy splint, pt holding small green bouncy ball (basketball size) and completing protraction, flexion, and circles each direction. 10X each focusing on control and LUE positioning      Shoulder Exercises: ROM/Strengthening   UBE (Upper Arm Bike) Level 3 3' forward 3' reverse, pace: 8.5    X to V Arms 10X    Proximal Shoulder Strengthening, Seated 10X each, no rest breaks      Additional Elbow Exercises   Hand Gripper with Large Beads all beads gripper at 20#, vertical, with radial nerve palsy splint on    Hand Gripper with Medium Beads all beads gripper at 29#, horizontal    Hand Gripper with Small Beads  all beads gripper at 29#, horizontal      Additional Wrist Exercises   Sponges Pt using green and red clothespins to grasp and stack 3 piles of 5 sponges while wearing radial nerve palsy splint. Pt able to stack 3 sponges before needing to use RUE to lift and stabilize left forearm      Hand Exercises   Thumb Opposition wearing radial nerve palsy splint, opposition to all fingers, 10X      Splinting   Splinting Provided pre-fabricated radial nerve palsy splint and adjusted to fit. Pt with good wrist stabilization during functional tasks.      Fine Motor Coordination (Hand/Wrist)   Fine Motor Coordination Flipping cards    Flipping cards Pt holding playing cards and rotating 2x then flipping 2x. Min difficulty with rotating, mod difficulty with flipping-multliple dropped cards and restarts. Improved to min difficulty with practice.                    OT Short Term Goals - 01/13/21 1412      OT SHORT TERM GOAL #1   Title Pt will be provided with and educated on HEP to improve LUE use as non-dominant during ADL completion.    Time 3    Period Weeks    Target Date 12/23/20      OT SHORT TERM GOAL #2   Title Pt will increase LUE P/ROM to [facility] to improve mobility throughout LUE required to perform dressing and bathing tasks using LUE as assist.    Time 3    Period Weeks      OT SHORT TERM GOAL #3   Title Pt will decrease LUE fascial restrictions to moderate amount to improve mobility required for functional reaching tasks.    Time 3    Period Weeks    Status Achieved      OT SHORT TERM GOAL #4   Title Pt will increase left elbow strength to 2/5 flexion and 3+/5 extension to improve ability to use LUE to assist during UB dressing tasks.    Time 3    Period Weeks    Status Achieved      OT SHORT TERM GOAL #5   Title Pt will be provided with necessary splints and educated on wear and care of splints for LUE wrist stability and functional use.    Time 3    Period Weeks     Status On-going      OT SHORT TERM GOAL #6   Title Pt will be educated on and verbalize appropriate understanding and utilization of sensory reintegration strategies.    Time 3    Period Weeks  OT Long Term Goals - 01/13/21 1449      OT LONG TERM GOAL #1   Title Pt will decrease pain in LUE to 3/10 or less to improve ability to sleep for 3+ consecutive hours without waking due to pain.    Baseline 2/8: pt reports no pain that is waking him up at night. He is able to sleep through the night without waking due to pain. He is having periodic nerve pain during the day.    Time 6    Period Weeks    Status Achieved      OT LONG TERM GOAL #2   Title Pt will increase LUE A/ROM to [facility] to improve ability to use LUE as assist during eating and grooming tasks.    Time 6    Period Weeks    Status On-going      OT LONG TERM GOAL #3   Title Pt will increase LUE shoulder strength to 4/5 or greater to improve ability to participate in exercise routine.    Time 6    Period Weeks    Status Achieved      OT LONG TERM GOAL #4   Title Pt will increase LUE elbow strength to 4-/5 flexion and 4/5 extension to improve ability to bring washcloth to face.    Time 6    Period Weeks    Status Partially Met      OT LONG TERM GOAL #5   Title Pt will increase LUE wrist strength to 4/5 flexion and 3/5 extension to improve ability to turn items (keys, tops to jars, etc) during ADLs.    Time 6    Period Weeks    Status On-going      OT LONG TERM GOAL #6   Title Pt will increase LUE grip strength by 25# and pinch strength by 6# to improve ability to grasp and maintain hold on items during functional use.    Time 6    Period Weeks    Status On-going      OT LONG TERM GOAL #7   Title Pt will increase fine motor coordination by completing 9 hole peg test in under 30" to improve ability to manipulate small items during ADLs.    Time 6    Period Weeks    Status On-going                  Plan - 01/15/21 1450    Clinical Impression Statement A: Pt reports he has been going to the gym every day and is feeling good about his exercises. Provided prefabricated radial nerve palsy splint and fitted for pt today, good results with wrist stability when completing exercises and functional tasks. Decreased pain in elbow region with use of RNP splint. Session focusing on LUE strengthening and stability, grip and pinch strengthening, and fine motor coordination tasks. Improved success with fine motor and grip tasks with use of RNP splint. Mod difficulty with card flipping, encouraged practice at home for HEP. Verbal cuing for form and technique during exercises.    Body Structure / Function / Physical Skills ADL;Endurance;UE functional use;Fascial restriction;Flexibility;Pain;FMC;ROM;Coordination;Sensation;IADL;Strength;Mobility;Edema    Plan P: Follow up on splint use at home and at [facility]. Continue with LUE strengthening and trial bodycraft system    OT Home Exercise Plan 12/30/20 coping strategies liest; stress management techniques; 1/27: sleep hygiene 1/31: shoulder stretches; 01/08/21: shoulder/elbow/forearm A/ROM    Consulted and Agree with Plan of Care Patient  Patient will benefit from skilled therapeutic intervention in order to improve the following deficits and impairments:   Body Structure / Function / Physical Skills: ADL,Endurance,UE functional use,Fascial restriction,Flexibility,Pain,FMC,ROM,Coordination,Sensation,IADL,Strength,Mobility,Edema       Visit Diagnosis: Other symptoms and signs involving the musculoskeletal system  Other lack of coordination  Acute pain of left shoulder  Other symptoms and signs involving the nervous system    Problem List Patient Active Problem List   Diagnosis Date Noted  . Critical polytrauma 11/15/2020  . Closed displaced comminuted fracture of shaft of left radius 10/29/2020  . Closed displaced comminuted  fracture of shaft of left ulna 10/29/2020  . Displaced comminuted fracture of shaft of left femur, initial encounter for closed fracture ([place]) 10/29/2020  . Vitamin D insufficiency 10/29/2020  . 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 Closed displaced comminuted fracture of shaft of left humerus 10/27/2020  . Hypogonadism in male 08/15/2019  . Other male erectile dysfunction 11/08/2016  . Depression 10/05/2016  . Current every day smoker 10/05/2016  . Weight gain, abnormal 10/05/2016  . Generalized anxiety disorder 03/01/2016    Guadelupe Sabin, OTR/L  [phone] 01/15/2021, 3:03 PM  [facility] [address] [place], [place], [address] Phone: [phone]   Fax:  [phone]  Name: Jason Alexander MRN: [id number] Date of Birth: Jan 22, 1976

## 2021-01-19 ENCOUNTER — Ambulatory Visit (HOSPITAL_COMMUNITY): Payer: Medicaid Other | Admitting: Physical Therapy

## 2021-01-20 ENCOUNTER — Other Ambulatory Visit: Payer: Self-pay

## 2021-01-20 ENCOUNTER — Ambulatory Visit (HOSPITAL_COMMUNITY): Payer: Medicaid Other | Admitting: Occupational Therapy

## 2021-01-20 ENCOUNTER — Encounter (HOSPITAL_COMMUNITY): Payer: Self-pay | Admitting: Occupational Therapy

## 2021-01-20 DIAGNOSIS — M6281 Muscle weakness (generalized): Secondary | ICD-10-CM | POA: Diagnosis not present

## 2021-01-20 DIAGNOSIS — R29898 Other symptoms and signs involving the musculoskeletal system: Secondary | ICD-10-CM | POA: Diagnosis not present

## 2021-01-20 DIAGNOSIS — S72352A Displaced comminuted fracture of shaft of left femur, initial encounter for closed fracture: Secondary | ICD-10-CM | POA: Diagnosis not present

## 2021-01-20 DIAGNOSIS — M25512 Pain in left shoulder: Secondary | ICD-10-CM | POA: Diagnosis not present

## 2021-01-20 DIAGNOSIS — M79602 Pain in left arm: Secondary | ICD-10-CM

## 2021-01-20 DIAGNOSIS — R41841 Cognitive communication deficit: Secondary | ICD-10-CM | POA: Diagnosis not present

## 2021-01-20 DIAGNOSIS — R278 Other lack of coordination: Secondary | ICD-10-CM | POA: Diagnosis not present

## 2021-01-20 DIAGNOSIS — R29818 Other symptoms and signs involving the nervous system: Secondary | ICD-10-CM

## 2021-01-20 DIAGNOSIS — R2681 Unsteadiness on feet: Secondary | ICD-10-CM | POA: Diagnosis not present

## 2021-01-20 DIAGNOSIS — M25612 Stiffness of left shoulder, not elsewhere classified: Secondary | ICD-10-CM | POA: Diagnosis not present

## 2021-01-20 DIAGNOSIS — R262 Difficulty in walking, not elsewhere classified: Secondary | ICD-10-CM | POA: Diagnosis not present

## 2021-01-20 NOTE — Therapy (Signed)
[place], [place], [address] Phone: [phone]   Fax:  [phone]  Occupational Therapy Treatment  Patient Details  Name: Jason Alexander MRN: [id number] Date of Birth: 1976/07/23 Referring Provider (OT): Lauraine Rinne, PA-C   Encounter Date: 01/20/2021   OT End of Session - 01/20/21 1507    Visit Number 13    Number of Visits 18    Date for OT Re-Evaluation 02/03/21    Authorization Type [facility] Medicaid    Authorization Time Period 27 visit limit combined PT/OT/SP; 9 visits approved 12/04/20-02/02/21; Requesting 4 additional visits    Authorization - Visit Number 8    Authorization - Number of Visits 9    OT Start Time 8341    OT Stop Time 1500    OT Time Calculation (min) 75 min    Activity Tolerance Patient tolerated treatment well    Behavior During Therapy WFL for tasks assessed/performed           Past Medical History:  Diagnosis Date  . Closed displaced comminuted fracture of shaft of left humerus 10/27/2020  . Closed displaced comminuted fracture of shaft of left radius 10/29/2020  . Closed displaced comminuted fracture of shaft of left ulna 10/29/2020  . Depression 2006   after divorce  . Displaced comminuted fracture of shaft of left femur, initial encounter for closed fracture ([place]) 10/29/2020  . 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 Vitamin D insufficiency 10/29/2020    Past Surgical History:  Procedure Laterality Date  . APPENDECTOMY    . FEMUR IM NAIL Left 10/28/2020   Procedure: INTRAMEDULLARY (IM) RETROGRADE FEMORAL NAILING;  Surgeon: Altamese [place] Creek, MD;  Location: [facility];  Service: Orthopedics;  Laterality: Left;  . NASAL SINUS SURGERY     polyp removal  . ORIF HUMERUS FRACTURE Left 10/28/2020   Procedure: OPEN REDUCTION INTERNAL FIXATION (ORIF) HUMERAL SHAFT FRACTURE;  Surgeon: Altamese Quechee, MD;  Location: [facility];  Service: Orthopedics;  Laterality: Left;  . ORIF RADIAL  FRACTURE Left 10/28/2020   Procedure: OPEN REDUCTION INTERNAL FIXATION (ORIF) RADIAL FRACTURE;  Surgeon: Altamese [place], MD;  Location: [facility];  Service: Orthopedics;  Laterality: Left;    There were no vitals filed for this visit.   Subjective Assessment - 01/20/21 1345    Subjective  S: I've been going to meetings and [employer] every day.    Currently in Pain? Yes    Pain Score 3     Pain Location Shoulder    Pain Orientation Left    Pain Descriptors / Indicators Tightness;Sore    Pain Type Acute pain    Pain Radiating Towards None    Pain Onset In the past 7 days    Pain Frequency Intermittent    Aggravating Factors  going to the [employer]    Pain Relieving Factors trigger point release    Effect of Pain on Daily Activities min effect on ADLs    Multiple Pain Sites No              [facility] OT Assessment - 01/20/21 1345      Assessment   Medical Diagnosis s/p left humerus ORIF, left ulna and radius ORIF, radius grafting, exploration of radial nerve      Precautions   Precautions Fall    Type of Shoulder Precautions Progress as tolerated. No orthopedic precautions                    OT Treatments/Exercises (  OP) - 01/20/21 1355      Exercises   Exercises Shoulder;Elbow;Wrist;Hand;Theraputty      Shoulder Exercises: ROM/Strengthening   Other ROM/Strengthening Exercises bodycraft: row/press, 50# plate, flexion, abduction, horizontal abduction 10#, row/extension/retraction 20#, 10X each      Elbow Exercises   Forearm Supination AROM;10 reps    Forearm Pronation AROM;10 reps    Other elbow exercises Elbow flexion and extention with pronated forearm 10X-elbows propped on table with goal of reaching hand to face.      Additional Elbow Exercises   Hand Gripper with Large Beads all beads gripper at 29#, horizontal, with radial nerve palsy splint on    Hand Gripper with Medium Beads all beads gripper at 29#, horizontal    Hand Gripper with Small Beads all beads gripper at  29#, horizontal      Manual Therapy   Other Manual Therapy Kinsiotape applied to left lateral epicondyle due to presesentation of possible tennis elbow. 2 strips applied. 1st strip: button hole strip with index and middle finger through holes at base. paper off tension used. Distal to proximal along the wrist extensors with elbow in extension, wrist flexed and pronated. 2nd strip: Y strip used with tails surrounding point of pain. 50% tension used with upper and lower tails.      Fine Motor Coordination (Hand/Wrist)   Fine Motor Coordination Small Pegboard    Small Pegboard Pt using tweezers to place pegs into pegboard, 4 pegs placed in 5 minutes                    OT Short Term Goals - 01/13/21 1412      OT SHORT TERM GOAL #1   Title Pt will be provided with and educated on HEP to improve LUE use as non-dominant during ADL completion.    Time 3    Period Weeks    Target Date 12/23/20      OT SHORT TERM GOAL #2   Title Pt will increase LUE P/ROM to [facility] to improve mobility throughout LUE required to perform dressing and bathing tasks using LUE as assist.    Time 3    Period Weeks      OT SHORT TERM GOAL #3   Title Pt will decrease LUE fascial restrictions to moderate amount to improve mobility required for functional reaching tasks.    Time 3    Period Weeks    Status Achieved      OT SHORT TERM GOAL #4   Title Pt will increase left elbow strength to 2/5 flexion and 3+/5 extension to improve ability to use LUE to assist during UB dressing tasks.    Time 3    Period Weeks    Status Achieved      OT SHORT TERM GOAL #5   Title Pt will be provided with necessary splints and educated on wear and care of splints for LUE wrist stability and functional use.    Time 3    Period Weeks    Status On-going      OT SHORT TERM GOAL #6   Title Pt will be educated on and verbalize appropriate understanding and utilization of sensory reintegration strategies.    Time 3    Period  Weeks             OT Long Term Goals - 01/13/21 1449      OT LONG TERM GOAL #1   Title Pt will decrease pain in LUE to 3/10  or less to improve ability to sleep for 3+ consecutive hours without waking due to pain.    Baseline 2/8: pt reports no pain that is waking him up at night. He is able to sleep through the night without waking due to pain. He is having periodic nerve pain during the day.    Time 6    Period Weeks    Status Achieved      OT LONG TERM GOAL #2   Title Pt will increase LUE A/ROM to [facility] to improve ability to use LUE as assist during eating and grooming tasks.    Time 6    Period Weeks    Status On-going      OT LONG TERM GOAL #3   Title Pt will increase LUE shoulder strength to 4/5 or greater to improve ability to participate in exercise routine.    Time 6    Period Weeks    Status Achieved      OT LONG TERM GOAL #4   Title Pt will increase LUE elbow strength to 4-/5 flexion and 4/5 extension to improve ability to bring washcloth to face.    Time 6    Period Weeks    Status Partially Met      OT LONG TERM GOAL #5   Title Pt will increase LUE wrist strength to 4/5 flexion and 3/5 extension to improve ability to turn items (keys, tops to jars, etc) during ADLs.    Time 6    Period Weeks    Status On-going      OT LONG TERM GOAL #6   Title Pt will increase LUE grip strength by 25# and pinch strength by 6# to improve ability to grasp and maintain hold on items during functional use.    Time 6    Period Weeks    Status On-going      OT LONG TERM GOAL #7   Title Pt will increase fine motor coordination by completing 9 hole peg test in under 30" to improve ability to manipulate small items during ADLs.    Time 6    Period Weeks    Status On-going                 Plan - 01/20/21 1435    Clinical Impression Statement A: I burned myself the other day when I was resting a cup of hot coffee on my arm and didn't know it. Pt reporting he has been going  to the [facility] and his LUE is sore; OT educated on use of massage stick which his sister has at home. Continued with LUE strengthening adding bodycraft work for strengthening and stability, focusing on slow and controlled movements. Continued with A/ROM for forearm supination. Also continued with grip and pinch work using RNP splint and focusing on keeping elbow flexed versus extended with reaching at tabletop, pt reporting decreased elbow pain/discomfort. Attempted fine motor task with tweezers wearing RNP splint, mod to max difficulty with task. Pt requesting additional kinesiotape for elbow support. Verbal cuing for form and technique during exercises and activities.    Body Structure / Function / Physical Skills ADL;Endurance;UE functional use;Fascial restriction;Flexibility;Pain;FMC;ROM;Coordination;Sensation;IADL;Strength;Mobility;Edema    Plan P: Continue with bodycraft system for LUE strengthening and provide HEP for use at [facility]; submit for additional 4 visits    OT Home Exercise Plan 12/30/20 coping strategies liest; stress management techniques; 1/27: sleep hygiene 1/31: shoulder stretches; 01/08/21: shoulder/elbow/forearm A/ROM    Consulted and Agree with Plan of  Care Patient           Patient will benefit from skilled therapeutic intervention in order to improve the following deficits and impairments:   Body Structure / Function / Physical Skills: ADL,Endurance,UE functional use,Fascial restriction,Flexibility,Pain,FMC,ROM,Coordination,Sensation,IADL,Strength,Mobility,Edema       Visit Diagnosis: Other symptoms and signs involving the musculoskeletal system  Other lack of coordination  Acute pain of left shoulder  Other symptoms and signs involving the nervous system  Pain in left arm    Problem List Patient Active Problem List   Diagnosis Date Noted  . Critical polytrauma 11/15/2020  . Closed displaced comminuted fracture of shaft of left radius 10/29/2020  . Closed displaced  comminuted fracture of shaft of left ulna 10/29/2020  . Displaced comminuted fracture of shaft of left femur, initial encounter for closed fracture ([place]) 10/29/2020  . Vitamin D insufficiency 10/29/2020  . 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 Closed displaced comminuted fracture of shaft of left humerus 10/27/2020  . Hypogonadism in male 08/15/2019  . Other male erectile dysfunction 11/08/2016  . Depression 10/05/2016  . Current every day smoker 10/05/2016  . Weight gain, abnormal 10/05/2016  . Generalized anxiety disorder 03/01/2016   Guadelupe Sabin, OTR/L  [phone] 01/20/2021, 3:09 PM  [facility] [address] [place], [place], [address] Phone: [phone]   Fax:  [phone]  Name: Jason Alexander MRN: [id number] Date of Birth: [birth year]

## 2021-01-21 ENCOUNTER — Encounter (HOSPITAL_COMMUNITY): Payer: Self-pay | Admitting: Speech Pathology

## 2021-01-21 ENCOUNTER — Ambulatory Visit (HOSPITAL_COMMUNITY): Payer: Medicaid Other | Admitting: Speech Pathology

## 2021-01-21 DIAGNOSIS — M25512 Pain in left shoulder: Secondary | ICD-10-CM | POA: Diagnosis not present

## 2021-01-21 DIAGNOSIS — M25612 Stiffness of left shoulder, not elsewhere classified: Secondary | ICD-10-CM | POA: Diagnosis not present

## 2021-01-21 DIAGNOSIS — R278 Other lack of coordination: Secondary | ICD-10-CM | POA: Diagnosis not present

## 2021-01-21 DIAGNOSIS — R41841 Cognitive communication deficit: Secondary | ICD-10-CM | POA: Diagnosis not present

## 2021-01-21 DIAGNOSIS — R262 Difficulty in walking, not elsewhere classified: Secondary | ICD-10-CM | POA: Diagnosis not present

## 2021-01-21 DIAGNOSIS — R2681 Unsteadiness on feet: Secondary | ICD-10-CM | POA: Diagnosis not present

## 2021-01-21 DIAGNOSIS — R29818 Other symptoms and signs involving the nervous system: Secondary | ICD-10-CM | POA: Diagnosis not present

## 2021-01-21 DIAGNOSIS — M6281 Muscle weakness (generalized): Secondary | ICD-10-CM | POA: Diagnosis not present

## 2021-01-21 DIAGNOSIS — S72352A Displaced comminuted fracture of shaft of left femur, initial encounter for closed fracture: Secondary | ICD-10-CM | POA: Diagnosis not present

## 2021-01-21 DIAGNOSIS — M79602 Pain in left arm: Secondary | ICD-10-CM | POA: Diagnosis not present

## 2021-01-21 DIAGNOSIS — R29898 Other symptoms and signs involving the musculoskeletal system: Secondary | ICD-10-CM | POA: Diagnosis not present
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
## 2021-01-22 ENCOUNTER — Other Ambulatory Visit: Payer: Self-pay

## 2021-01-22 ENCOUNTER — Ambulatory Visit (HOSPITAL_COMMUNITY): Payer: Medicaid Other | Admitting: Occupational Therapy

## 2021-01-22 ENCOUNTER — Encounter (HOSPITAL_COMMUNITY): Payer: Self-pay | Admitting: Occupational Therapy

## 2021-01-22 DIAGNOSIS — M79602 Pain in left arm: Secondary | ICD-10-CM | POA: Diagnosis not present

## 2021-01-22 DIAGNOSIS — M25512 Pain in left shoulder: Secondary | ICD-10-CM

## 2021-01-22 DIAGNOSIS — R2681 Unsteadiness on feet: Secondary | ICD-10-CM | POA: Diagnosis not present

## 2021-01-22 DIAGNOSIS — R41841 Cognitive communication deficit: Secondary | ICD-10-CM | POA: Diagnosis not present

## 2021-01-22 DIAGNOSIS — R278 Other lack of coordination: Secondary | ICD-10-CM

## 2021-01-22 DIAGNOSIS — R29898 Other symptoms and signs involving the musculoskeletal system: Secondary | ICD-10-CM | POA: Diagnosis not present

## 2021-01-22 DIAGNOSIS — R29818 Other symptoms and signs involving the nervous system: Secondary | ICD-10-CM | POA: Diagnosis not present

## 2021-01-22 DIAGNOSIS — M6281 Muscle weakness (generalized): Secondary | ICD-10-CM | POA: Diagnosis not present

## 2021-01-22 DIAGNOSIS — R262 Difficulty in walking, not elsewhere classified: Secondary | ICD-10-CM | POA: Diagnosis not present

## 2021-01-22 DIAGNOSIS — S72352A Displaced comminuted fracture of shaft of left femur, initial encounter for closed fracture: Secondary | ICD-10-CM | POA: Diagnosis not present

## 2021-01-22 DIAGNOSIS — M25612 Stiffness of left shoulder, not elsewhere classified: Secondary | ICD-10-CM | POA: Diagnosis not present

## 2021-01-22 NOTE — Therapy (Signed)
[place], [place], [address] Phone: [phone]   Fax:  [phone]  Occupational Therapy Treatment  Patient Details  Name: Jason Alexander MRN: [id number] Date of Birth: December 16, 1975 Referring Provider (OT): Lauraine Rinne, PA-C   Encounter Date: 01/22/2021   OT End of Session - 01/22/21 1454    Visit Number 14    Number of Visits 18    Date for OT Re-Evaluation 02/03/21    Authorization Type [facility] Medicaid    Authorization Time Period 27 visit limit combined PT/OT/SP; 9 visits approved 12/04/20-02/02/21; Requesting 4 additional visits    Authorization - Visit Number 9    Authorization - Number of Visits 9    OT Start Time 2409    OT Stop Time 1512    OT Time Calculation (min) 84 min    Activity Tolerance Patient tolerated treatment well    Behavior During Therapy WFL for tasks assessed/performed           Past Medical History:  Diagnosis Date  . Closed displaced comminuted fracture of shaft of left humerus 10/27/2020  . Closed displaced comminuted fracture of shaft of left radius 10/29/2020  . Closed displaced comminuted fracture of shaft of left ulna 10/29/2020  . Depression 2006   after divorce  . Displaced comminuted fracture of shaft of left femur, initial encounter for closed fracture ([place]) 10/29/2020  . 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 Vitamin D insufficiency 10/29/2020    Past Surgical History:  Procedure Laterality Date  . APPENDECTOMY    . FEMUR IM NAIL Left 10/28/2020   Procedure: INTRAMEDULLARY (IM) RETROGRADE FEMORAL NAILING;  Surgeon: Altamese [place], MD;  Location: [place];  Service: Orthopedics;  Laterality: Left;  . NASAL SINUS SURGERY     polyp removal  . ORIF HUMERUS FRACTURE Left 10/28/2020   Procedure: OPEN REDUCTION INTERNAL FIXATION (ORIF) HUMERAL SHAFT FRACTURE;  Surgeon: Altamese McCulloch, MD;  Location: [place];  Service: Orthopedics;  Laterality: Left;  . ORIF RADIAL  FRACTURE Left 10/28/2020   Procedure: OPEN REDUCTION INTERNAL FIXATION (ORIF) RADIAL FRACTURE;  Surgeon: Altamese [place], MD;  Location: [place];  Service: Orthopedics;  Laterality: Left;    There were no vitals filed for this visit.   Subjective Assessment - 01/22/21 1353    Subjective  S: I swam yesterday.    Currently in Pain? No/denies              [facility] OT Assessment - 01/22/21 1352      Assessment   Medical Diagnosis s/p left humerus ORIF, left ulna and radius ORIF, radius grafting, exploration of radial nerve      Precautions   Precautions Fall    Type of Shoulder Precautions Progress as tolerated. No orthopedic precautions                    OT Treatments/Exercises (OP) - 01/22/21 1353      Exercises   Exercises Shoulder;Elbow;Wrist;Hand;Theraputty      Shoulder Exercises: Therapy Ball   Other Therapy Ball Exercises Wearing radial nerve palsy splint, pt holding small orange bouncy ball (basketball size) and completing protraction, flexion, and circles each direction. 10X each focusing on control and LUE positioning      Shoulder Exercises: ROM/Strengthening   Other ROM/Strengthening Exercises bodycraft: row 50# plate, press 40# plate, protraction/flexion/abduction/horizontal abduction/IR 10#, 10X each      Elbow Exercises   Forearm Supination AROM;10 reps    Forearm Pronation AROM;10  reps      Additional Elbow Exercises   Hand Gripper with Large Beads all beads gripper at 42#, horizontal, with radial nerve palsy splint on    Hand Gripper with Medium Beads all beads gripper at 38#, horizontal      Additional Wrist Exercises   Sponges Pt using green clothespin and 2 point pinch to stack 2 piles of 5 high resistance sponges. Transitioned to red clothespin for 2 remaining piles. Pt wearing radial nerve palsy splint for wrist stability but without finger loops. Using right arm to stabilize forearm/elbow.      Hand Exercises   Other Hand Exercises --       Modalities   Modalities Ultrasound      Ultrasound   Ultrasound Location left lateral and medial epicondyles    Ultrasound Parameters 1.5 W/cm2    Ultrasound Goals Pain      Fine Motor Coordination (Hand/Wrist)   Fine Motor Coordination Stacking coins    Stacking coins Pt picking up sequence chips and stacking. Completed 2 stacks of 5 with min difficulty                  OT Education - 01/22/21 1432    Education Details bodyweight strengthening (tband smartphrase), therapy ball strengthening    Person(s) Educated Patient    Methods Explanation;Demonstration;Handout    Comprehension Verbalized understanding;Returned demonstration            OT Short Term Goals - 01/13/21 1412      OT SHORT TERM GOAL #1   Title Pt will be provided with and educated on HEP to improve LUE use as non-dominant during ADL completion.    Time 3    Period Weeks    Target Date 12/23/20      OT SHORT TERM GOAL #2   Title Pt will increase LUE P/ROM to [facility] to improve mobility throughout LUE required to perform dressing and bathing tasks using LUE as assist.    Time 3    Period Weeks      OT SHORT TERM GOAL #3   Title Pt will decrease LUE fascial restrictions to moderate amount to improve mobility required for functional reaching tasks.    Time 3    Period Weeks    Status Achieved      OT SHORT TERM GOAL #4   Title Pt will increase left elbow strength to 2/5 flexion and 3+/5 extension to improve ability to use LUE to assist during UB dressing tasks.    Time 3    Period Weeks    Status Achieved      OT SHORT TERM GOAL #5   Title Pt will be provided with necessary splints and educated on wear and care of splints for LUE wrist stability and functional use.    Time 3    Period Weeks    Status On-going      OT SHORT TERM GOAL #6   Title Pt will be educated on and verbalize appropriate understanding and utilization of sensory reintegration strategies.    Time 3    Period Weeks              OT Long Term Goals - 01/13/21 1449      OT LONG TERM GOAL #1   Title Pt will decrease pain in LUE to 3/10 or less to improve ability to sleep for 3+ consecutive hours without waking due to pain.    Baseline 2/8: pt reports no pain that  is waking him up at night. He is able to sleep through the night without waking due to pain. He is having periodic nerve pain during the day.    Time 6    Period Weeks    Status Achieved      OT LONG TERM GOAL #2   Title Pt will increase LUE A/ROM to [facility] to improve ability to use LUE as assist during eating and grooming tasks.    Time 6    Period Weeks    Status On-going      OT LONG TERM GOAL #3   Title Pt will increase LUE shoulder strength to 4/5 or greater to improve ability to participate in exercise routine.    Time 6    Period Weeks    Status Achieved      OT LONG TERM GOAL #4   Title Pt will increase LUE elbow strength to 4-/5 flexion and 4/5 extension to improve ability to bring washcloth to face.    Time 6    Period Weeks    Status Partially Met      OT LONG TERM GOAL #5   Title Pt will increase LUE wrist strength to 4/5 flexion and 3/5 extension to improve ability to turn items (keys, tops to jars, etc) during ADLs.    Time 6    Period Weeks    Status On-going      OT LONG TERM GOAL #6   Title Pt will increase LUE grip strength by 25# and pinch strength by 6# to improve ability to grasp and maintain hold on items during functional use.    Time 6    Period Weeks    Status On-going      OT LONG TERM GOAL #7   Title Pt will increase fine motor coordination by completing 9 hole peg test in under 30" to improve ability to manipulate small items during ADLs.    Time 6    Period Weeks    Status On-going                 Plan - 01/22/21 1445    Clinical Impression Statement A: Pt reports he was able to swim down the length of the pool 1x. Also reports he now has a 1 year [employer] as he was approved for  financial assistance through the [employer]. Continued with shoulder strengthening and pt working on adjusting bodycraft machine and reading HEP to ensure good comprehension of each exercise. Continued with grip strengthening and fine motor coordination tasks. Continues to have difficulty with supination. Added ultrasound at end of session to left lateral and medial epicondyle region to address elbow pain suspected to be from lateral epicondylitis. Verbal cuing for form and technique during exercises.    Body Structure / Function / Physical Skills ADL;Endurance;UE functional use;Fascial restriction;Flexibility;Pain;FMC;ROM;Coordination;Sensation;IADL;Strength;Mobility;Edema    Plan P: Follow up on LUE strengthening at [facility] and [place] for elbow pain. Continue with supination work and fine motor coordination tasks.    OT Home Exercise Plan 12/30/20 coping strategies liest; stress management techniques; 1/27: sleep hygiene 1/31: shoulder stretches; 01/08/21: shoulder/elbow/forearm A/ROM; 2/17: LUE strengthening via weight machines and therapy ball    Consulted and Agree with Plan of Care Patient           Patient will benefit from skilled therapeutic intervention in order to improve the following deficits and impairments:   Body Structure / Function / Physical Skills: ADL,Endurance,UE functional use,Fascial restriction,Flexibility,Pain,FMC,ROM,Coordination,Sensation,IADL,Strength,Mobility,Edema  Visit Diagnosis: Other symptoms and signs involving the musculoskeletal system  Other lack of coordination  Acute pain of left shoulder  Other symptoms and signs involving the nervous system    Problem List Patient Active Problem List   Diagnosis Date Noted  . Critical polytrauma 11/15/2020  . Closed displaced comminuted fracture of shaft of left radius 10/29/2020  . Closed displaced comminuted fracture of shaft of left ulna 10/29/2020  . Displaced comminuted fracture of shaft of left femur,  initial encounter for closed fracture ([place]) 10/29/2020  . Vitamin D insufficiency 10/29/2020  . 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 Closed displaced comminuted fracture of shaft of left humerus 10/27/2020  . Hypogonadism in male 08/15/2019  . Other male erectile dysfunction 11/08/2016  . Depression 10/05/2016  . Current every day smoker 10/05/2016  . Weight gain, abnormal 10/05/2016  . Generalized anxiety disorder 03/01/2016   Guadelupe Sabin, OTR/L  [phone] 01/22/2021, 3:31 PM  [place] [address] [place], [place], [address] Phone: [phone]   Fax:  [phone]  Name: Raffael Bugarin MRN: [id number] Date of Birth: November 20, 1976

## 2021-01-22 NOTE — Patient Instructions (Addendum)
  Theraband strengthening: Complete 10-15X, 1-2X/day When using bodyweight equipment begin with low weight and progress to more as able.   1) Shoulder protraction  Anchor band in doorway, stand with back to door. Push your hand forward as much as you can to bringing your shoulder blades forward on your rib cage.      2) Shoulder horizontal abduction  Standing with a theraband anchored at chest height, begin with arm straight and some tension in the band. Move your arm out to your side (keeping straight the whole time). Bring the affected arm back to midline.     3) Shoulder Internal Rotation  While holding an elastic band at your side with your elbow bent, start with your hand away from your stomach, then pull the band towards your stomach. Keep your elbow near your side the entire time.     4) Shoulder External Rotation  While holding an elastic band at your side with your elbow bent, start with your hand near your stomach and then pull the band away. Keep your elbow at your side the entire time.     5) Shoulder flexion  While standing with back to the door, holding Theraband at hand level, raise arm in front of you.  Keep elbow straight through entire movement.      6) Shoulder abduction  While holding an elastic band at your side, draw up your arm to the side keeping your elbow straight.      Therapy Ball Strengthening Exercises: Complete all exercises 10-15X each, 2x/day.   1) Overhead press: Hold a medicine ball or other ball at your chest. Next, extend your elbows and push the ball over your head as shown. Return to starting position and repeat.     2) Chest press: Hold a medicine ball or other ball at your chest. Next, extend your elbows and push the ball outward in front of your body as shown. Return to starting position and repeat.     3) Ball circles:  Hold a medicine ball or other ball at your chest. Next, extend your elbows and push the ball  outward in front of your body as shown. Then, move the ball in a circular pattern for several revolutions and then reverse the direction.     4) Flexion: Start holding an exercise ball out in front of your body with elbows extended. Then, slowly lift the ball up over head. Return the ball to starting position and repeat.     5) Ball diagonals: Begin holding a ball with both hands and then move it through a diagonal pattern from your hip to over your opposite shoulder as shown.

## 2021-01-23 ENCOUNTER — Encounter
Payer: Medicaid Other | Attending: Physical Medicine and Rehabilitation | Admitting: Physical Medicine and Rehabilitation

## 2021-01-23 ENCOUNTER — Encounter: Payer: Self-pay | Admitting: Physical Medicine and Rehabilitation

## 2021-01-23 ENCOUNTER — Other Ambulatory Visit: Payer: Self-pay

## 2021-01-23 VITALS — BP 126/90 | HR 90 | Temp 98.6°F | Ht 71.5 in | Wt 220.2 lb

## 2021-01-23 DIAGNOSIS — T07XXXA Unspecified multiple injuries, initial encounter: Secondary | ICD-10-CM

## 2021-01-23 DIAGNOSIS — F322 Major depressive disorder, single episode, severe without psychotic features: Secondary | ICD-10-CM

## 2021-01-23 DIAGNOSIS — M7918 Myalgia, other site: Secondary | ICD-10-CM

## 2021-01-23 DIAGNOSIS — F4310 Post-traumatic stress disorder, unspecified: Secondary | ICD-10-CM

## 2021-01-23 DIAGNOSIS — F32A Depression, unspecified: Secondary | ICD-10-CM | POA: Diagnosis not present

## 2021-01-23 DIAGNOSIS — G5632 Lesion of radial nerve, left upper limb: Secondary | ICD-10-CM | POA: Diagnosis not present

## 2021-01-23 DIAGNOSIS — S42352S Displaced comminuted fracture of shaft of humerus, left arm, sequela: Secondary | ICD-10-CM | POA: Diagnosis not present

## 2021-01-23 DIAGNOSIS — S52352S Displaced comminuted fracture of shaft of radius, left arm, sequela: Secondary | ICD-10-CM

## 2021-01-23 MED ORDER — NORTRIPTYLINE HCL 50 MG PO CAPS: 50.0000 mg | ORAL_CAPSULE | Freq: Every day | ORAL | 1 refills | Status: AC
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
## 2021-01-26 ENCOUNTER — Telehealth (HOSPITAL_COMMUNITY): Payer: Self-pay | Admitting: Occupational Therapy

## 2021-01-26 ENCOUNTER — Ambulatory Visit (HOSPITAL_COMMUNITY): Payer: Medicaid Other | Admitting: Physical Therapy

## 2021-01-26 NOTE — Telephone Encounter (Signed)
pt called to cx due to he has a job interview

## 2021-01-27 ENCOUNTER — Ambulatory Visit (HOSPITAL_COMMUNITY): Payer: Medicaid Other | Admitting: Occupational Therapy

## 2021-01-27 ENCOUNTER — Encounter (HOSPITAL_COMMUNITY): Payer: Self-pay | Admitting: Speech Pathology

## 2021-01-27 ENCOUNTER — Other Ambulatory Visit: Payer: Self-pay

## 2021-01-27 ENCOUNTER — Ambulatory Visit (HOSPITAL_COMMUNITY): Payer: Medicaid Other | Admitting: Speech Pathology

## 2021-01-27 DIAGNOSIS — M25512 Pain in left shoulder: Secondary | ICD-10-CM | POA: Diagnosis not present

## 2021-01-27 DIAGNOSIS — R29818 Other symptoms and signs involving the nervous system: Secondary | ICD-10-CM | POA: Diagnosis not present

## 2021-01-27 DIAGNOSIS — R41841 Cognitive communication deficit: Secondary | ICD-10-CM | POA: Diagnosis not present

## 2021-01-27 DIAGNOSIS — S72352A Displaced comminuted fracture of shaft of left femur, initial encounter for closed fracture: Secondary | ICD-10-CM | POA: Diagnosis not present

## 2021-01-27 DIAGNOSIS — R2681 Unsteadiness on feet: Secondary | ICD-10-CM | POA: Diagnosis not present

## 2021-01-27 DIAGNOSIS — R262 Difficulty in walking, not elsewhere classified: Secondary | ICD-10-CM | POA: Diagnosis not present

## 2021-01-27 DIAGNOSIS — M79602 Pain in left arm: Secondary | ICD-10-CM | POA: Diagnosis not present

## 2021-01-27 DIAGNOSIS — M25612 Stiffness of left shoulder, not elsewhere classified: Secondary | ICD-10-CM | POA: Diagnosis not present

## 2021-01-27 DIAGNOSIS — R29898 Other symptoms and signs involving the musculoskeletal system: Secondary | ICD-10-CM | POA: Diagnosis not present

## 2021-01-27 DIAGNOSIS — M6281 Muscle weakness (generalized): Secondary | ICD-10-CM | POA: Diagnosis not present

## 2021-01-27 DIAGNOSIS — R278 Other lack of coordination: Secondary | ICD-10-CM | POA: Diagnosis not present
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
## 2021-01-28 ENCOUNTER — Encounter: Payer: Self-pay | Admitting: Physical Medicine and Rehabilitation

## 2021-01-28 ENCOUNTER — Encounter: Payer: Medicaid Other | Admitting: Physical Medicine and Rehabilitation

## 2021-01-29 ENCOUNTER — Encounter (HOSPITAL_COMMUNITY): Payer: Self-pay | Admitting: Occupational Therapy

## 2021-01-29 ENCOUNTER — Other Ambulatory Visit: Payer: Self-pay

## 2021-01-29 ENCOUNTER — Ambulatory Visit (HOSPITAL_COMMUNITY): Payer: Medicaid Other | Admitting: Speech Pathology

## 2021-01-29 ENCOUNTER — Ambulatory Visit (HOSPITAL_COMMUNITY): Payer: Medicaid Other | Admitting: Occupational Therapy

## 2021-01-29 DIAGNOSIS — M79602 Pain in left arm: Secondary | ICD-10-CM

## 2021-01-29 DIAGNOSIS — R262 Difficulty in walking, not elsewhere classified: Secondary | ICD-10-CM | POA: Diagnosis not present

## 2021-01-29 DIAGNOSIS — R41841 Cognitive communication deficit: Secondary | ICD-10-CM | POA: Diagnosis not present

## 2021-01-29 DIAGNOSIS — R278 Other lack of coordination: Secondary | ICD-10-CM

## 2021-01-29 DIAGNOSIS — M6281 Muscle weakness (generalized): Secondary | ICD-10-CM | POA: Diagnosis not present

## 2021-01-29 DIAGNOSIS — M25512 Pain in left shoulder: Secondary | ICD-10-CM | POA: Diagnosis not present

## 2021-01-29 DIAGNOSIS — M25612 Stiffness of left shoulder, not elsewhere classified: Secondary | ICD-10-CM | POA: Diagnosis not present

## 2021-01-29 DIAGNOSIS — R29898 Other symptoms and signs involving the musculoskeletal system: Secondary | ICD-10-CM | POA: Diagnosis not present

## 2021-01-29 DIAGNOSIS — R29818 Other symptoms and signs involving the nervous system: Secondary | ICD-10-CM | POA: Diagnosis not present

## 2021-01-29 DIAGNOSIS — R2681 Unsteadiness on feet: Secondary | ICD-10-CM | POA: Diagnosis not present

## 2021-01-29 DIAGNOSIS — S72352A Displaced comminuted fracture of shaft of left femur, initial encounter for closed fracture: Secondary | ICD-10-CM | POA: Diagnosis not present

## 2021-01-29 NOTE — Therapy (Signed)
[place] [place], [place], [address] Phone: [phone]   Fax:  [phone]  Occupational Therapy Treatment  Patient Details  Name: Jason Alexander MRN: [id number] Date of Birth: [birth year] Referring Provider (OT): Lauraine Rinne, PA-C   Encounter Date: 01/29/2021   OT End of Session - 01/29/21 1513    Visit Number 15    Number of Visits 18    Date for OT Re-Evaluation 02/03/21    Authorization Type [employer]    Authorization Time Period 27 visit limit combined PT/OT/SP; 4 additional visits approved 12/04/20-02/27/21    Authorization - Visit Number 10    Authorization - Number of Visits 13    OT Start Time 8527   pt arrived late   OT Stop Time 1537    OT Time Calculation (min) 60 min    Activity Tolerance Patient tolerated treatment well    Behavior During Therapy [facility] for tasks assessed/performed           Past Medical History:  Diagnosis Date  . Closed displaced comminuted fracture of shaft of left humerus 10/27/2020  . Closed displaced comminuted fracture of shaft of left radius 10/29/2020  . Closed displaced comminuted fracture of shaft of left ulna 10/29/2020  . Depression 2006   after divorce  . Displaced comminuted fracture of shaft of left femur, initial encounter for closed fracture ([place]) 10/29/2020  . 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 Vitamin D insufficiency 10/29/2020    Past Surgical History:  Procedure Laterality Date  . APPENDECTOMY    . FEMUR IM NAIL Left 10/28/2020   Procedure: INTRAMEDULLARY (IM) RETROGRADE FEMORAL NAILING;  Surgeon: Altamese [place], MD;  Location: [place];  Service: Orthopedics;  Laterality: Left;  . NASAL SINUS SURGERY     polyp removal  . ORIF HUMERUS FRACTURE Left 10/28/2020   Procedure: OPEN REDUCTION INTERNAL FIXATION (ORIF) HUMERAL SHAFT FRACTURE;  Surgeon: Altamese [place], MD;  Location: [place];  Service: Orthopedics;  Laterality: Left;  . ORIF RADIAL FRACTURE  Left 10/28/2020   Procedure: OPEN REDUCTION INTERNAL FIXATION (ORIF) RADIAL FRACTURE;  Surgeon: Altamese , MD;  Location: [place];  Service: Orthopedics;  Laterality: Left;    There were no vitals filed for this visit.   Subjective Assessment - 01/29/21 1437    Subjective  S: I'm still swimming, probably 5 out of 7 days.    Currently in Pain? No/denies              [facility] OT Assessment - 01/29/21 1436      Assessment   Medical Diagnosis s/p left humerus ORIF, left ulna and radius ORIF, radius grafting, exploration of radial nerve      Precautions   Precautions Fall    Type of Shoulder Precautions Progress as tolerated. No orthopedic precautions                    OT Treatments/Exercises (OP) - 01/29/21 1438      Exercises   Exercises Shoulder;Elbow;Wrist;Hand;Theraputty      Shoulder Exercises: Therapy Ball   Other Therapy Ball Exercises Pt holding small orange bouncy ball (basketball size) and completing protraction, flexion, and circles each direction. 15X each focusing on control and LUE positioning. No radial nerve palsy splint used today, pt able to keep wrist in neutral for majority of exercises.    Other Therapy Ball Exercises Arms extended to 90 degrees flexion, pt holding orange ball and working on rotating for supination/prontation. Mod  to max difficulty with supination, 10X.Also completed with elbow bent by sides-max difficulty.      Modalities   Modalities Tour manager Parameters 45    Electrical Stimulation Goals Neuromuscular facilitation      Ultrasound   Ultrasound Location left lateral epicondyle    Ultrasound Parameters 1.5 W/cm2    Ultrasound Goals Pain      Fine Motor Coordination (Hand/Wrist)   Fine Motor Coordination Small Pegboard    Small Pegboard Pt using tweezers to place pegs  into pegboard,  cuing for how to hold tweezers. Pt completed full pattern in 14 minutes.                    OT Short Term Goals - 01/13/21 1412      OT SHORT TERM GOAL #1   Title Pt will be provided with and educated on HEP to improve LUE use as non-dominant during ADL completion.    Time 3    Period Weeks    Target Date 12/23/20      OT SHORT TERM GOAL #2   Title Pt will increase LUE P/ROM to [facility] to improve mobility throughout LUE required to perform dressing and bathing tasks using LUE as assist.    Time 3    Period Weeks      OT SHORT TERM GOAL #3   Title Pt will decrease LUE fascial restrictions to moderate amount to improve mobility required for functional reaching tasks.    Time 3    Period Weeks    Status Achieved      OT SHORT TERM GOAL #4   Title Pt will increase left elbow strength to 2/5 flexion and 3+/5 extension to improve ability to use LUE to assist during UB dressing tasks.    Time 3    Period Weeks    Status Achieved      OT SHORT TERM GOAL #5   Title Pt will be provided with necessary splints and educated on wear and care of splints for LUE wrist stability and functional use.    Time 3    Period Weeks    Status On-going      OT SHORT TERM GOAL #6   Title Pt will be educated on and verbalize appropriate understanding and utilization of sensory reintegration strategies.    Time 3    Period Weeks             OT Long Term Goals - 01/13/21 1449      OT LONG TERM GOAL #1   Title Pt will decrease pain in LUE to 3/10 or less to improve ability to sleep for 3+ consecutive hours without waking due to pain.    Baseline 2/8: pt reports no pain that is waking him up at night. He is able to sleep through the night without waking due to pain. He is having periodic nerve pain during the day.    Time 6    Period Weeks    Status Achieved      OT LONG TERM GOAL #2   Title Pt will increase LUE A/ROM to [facility] to improve ability to use LUE as assist during  eating and grooming tasks.    Time 6    Period Weeks    Status On-going      OT LONG TERM  GOAL #3   Title Pt will increase LUE shoulder strength to 4/5 or greater to improve ability to participate in exercise routine.    Time 6    Period Weeks    Status Achieved      OT LONG TERM GOAL #4   Title Pt will increase LUE elbow strength to 4-/5 flexion and 4/5 extension to improve ability to bring washcloth to face.    Time 6    Period Weeks    Status Partially Met      OT LONG TERM GOAL #5   Title Pt will increase LUE wrist strength to 4/5 flexion and 3/5 extension to improve ability to turn items (keys, tops to jars, etc) during ADLs.    Time 6    Period Weeks    Status On-going      OT LONG TERM GOAL #6   Title Pt will increase LUE grip strength by 25# and pinch strength by 6# to improve ability to grasp and maintain hold on items during functional use.    Time 6    Period Weeks    Status On-going      OT LONG TERM GOAL #7   Title Pt will increase fine motor coordination by completing 9 hole peg test in under 30" to improve ability to manipulate small items during ADLs.    Time 6    Period Weeks    Status On-going                 Plan - 01/29/21 1456    Clinical Impression Statement A: Trialed NMES at beginning of session for wrist extension, pt able to feel current however no activation was able to be achieved. Pt reports he is swimming almost daily and has a part time job detailing cars at [employer] which requires a lot of tedious tasks and his arm has been a little sore. Session focusing on more fine motor coordination today, as pt is completing LUE strengthening daily at the [facility]. Pt completing small pegboard task with improved speed and accuracy today, OT cuing for how to hold tweezers to increase success. Pt wearing RNP splint for wrist stability, did not utilize finger loops. [place] completed at end of session for pain as pt reports decreased pain after using last  session.    Body Structure / Function / Physical Skills ADL;Endurance;UE functional use;Fascial restriction;Flexibility;Pain;FMC;ROM;Coordination;Sensation;IADL;Strength;Mobility;Edema    Plan P: Continue with supination work, focus more on fine motor coordination tasks as pt is completing strengthening at [place] 12/30/20 coping strategies liest; stress management techniques; 1/27: sleep hygiene 1/31: shoulder stretches; 01/08/21: shoulder/elbow/forearm A/ROM; 2/17: LUE strengthening via weight machines and therapy ball    Consulted and Agree with Plan of Care Patient           Patient will benefit from skilled therapeutic intervention in order to improve the following deficits and impairments:   Body Structure / Function / Physical Skills: ADL,Endurance,UE functional use,Fascial restriction,Flexibility,Pain,FMC,ROM,Coordination,Sensation,IADL,Strength,Mobility,Edema       Visit Diagnosis: Other symptoms and signs involving the musculoskeletal system  Other lack of coordination  Acute pain of left shoulder  Other symptoms and signs involving the nervous system  Pain in left arm  Stiffness of left shoulder, not elsewhere classified    Problem List Patient Active Problem List   Diagnosis Date Noted  . Critical polytrauma 11/15/2020  . Closed displaced comminuted fracture of shaft of left radius 10/29/2020  . Closed displaced  comminuted fracture of shaft of left ulna 10/29/2020  . Displaced comminuted fracture of shaft of left femur, initial encounter for closed fracture ([place]) 10/29/2020  . Vitamin D insufficiency 10/29/2020  . 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 Closed displaced comminuted fracture of shaft of left humerus 10/27/2020  . Hypogonadism in male 08/15/2019  . Other male erectile dysfunction 11/08/2016  . Depression 10/05/2016  . Current every day smoker 10/05/2016  . Weight gain, abnormal 10/05/2016  . Generalized anxiety disorder  03/01/2016   Guadelupe Sabin, OTR/L  [phone] 01/29/2021, 5:36 PM  [place] [address] [place], [place], [address] Phone: [phone]   Fax:  [phone]  Name: Jason Alexander MRN: [id number] Date of Birth: 1976/02/13

## 2021-02-02 ENCOUNTER — Ambulatory Visit (HOSPITAL_COMMUNITY): Payer: Medicaid Other | Admitting: Physical Therapy

## 2021-02-03 DIAGNOSIS — Z419 Encounter for procedure for purposes other than remedying health state, unspecified: Secondary | ICD-10-CM | POA: Diagnosis not present

## 2021-02-04 ENCOUNTER — Ambulatory Visit: Payer: Medicaid Other | Admitting: Physical Medicine and Rehabilitation

## 2021-02-04 DIAGNOSIS — S72352D Displaced comminuted fracture of shaft of left femur, subsequent encounter for closed fracture with routine healing: Secondary | ICD-10-CM | POA: Diagnosis not present

## 2021-02-04 DIAGNOSIS — S52252D Displaced comminuted fracture of shaft of ulna, left arm, subsequent encounter for closed fracture with routine healing: Secondary | ICD-10-CM | POA: Diagnosis not present

## 2021-02-04 DIAGNOSIS — S52352D Displaced comminuted fracture of shaft of radius, left arm, subsequent encounter for closed fracture with routine healing: Secondary | ICD-10-CM | POA: Diagnosis not present

## 2021-02-04 DIAGNOSIS — S42352D Displaced comminuted fracture of shaft of humerus, left arm, subsequent encounter for fracture with routine healing: Secondary | ICD-10-CM | POA: Diagnosis not present

## 2021-02-05 ENCOUNTER — Ambulatory Visit (HOSPITAL_COMMUNITY): Payer: Medicaid Other | Attending: Physician Assistant | Admitting: Speech Pathology

## 2021-02-05 ENCOUNTER — Telehealth (HOSPITAL_COMMUNITY): Payer: Self-pay | Admitting: Speech Pathology

## 2021-02-05 NOTE — Telephone Encounter (Signed)
Telephone Call  SLP left a voice mail in regards to Pt's missed SLP appointment this AM at 10:30. He has one visit he can use if it is scheduled before 02/16/2021.   Thank you,  Havery Moros, CCC-SLP [phone]

## 2021-02-13 ENCOUNTER — Telehealth (HOSPITAL_COMMUNITY): Payer: Self-pay | Admitting: Occupational Therapy

## 2021-02-13 NOTE — Telephone Encounter (Signed)
Jason Alexander came by the office states he is doing better and wants to be placed on hold for OT/ST. He will return after hand surgery.  He wanted Dabney and Verlon Au to know he did not run out on them and that he got a job. He understands PT has d/c him already.

## 2021-03-02 DIAGNOSIS — M21332 Wrist drop, left wrist: Secondary | ICD-10-CM | POA: Diagnosis not present

## 2021-03-06 DIAGNOSIS — Z419 Encounter for procedure for purposes other than remedying health state, unspecified: Secondary | ICD-10-CM | POA: Diagnosis not present

## 2021-03-09 ENCOUNTER — Other Ambulatory Visit (HOSPITAL_COMMUNITY): Payer: Self-pay

## 2021-03-10 ENCOUNTER — Encounter
Payer: Medicaid Other | Attending: Physical Medicine and Rehabilitation | Admitting: Physical Medicine and Rehabilitation

## 2021-03-10 DIAGNOSIS — F32A Depression, unspecified: Secondary | ICD-10-CM | POA: Insufficient documentation

## 2021-03-10 DIAGNOSIS — S42352S Displaced comminuted fracture of shaft of humerus, left arm, sequela: Secondary | ICD-10-CM | POA: Insufficient documentation

## 2021-03-10 DIAGNOSIS — F322 Major depressive disorder, single episode, severe without psychotic features: Secondary | ICD-10-CM | POA: Insufficient documentation

## 2021-03-10 DIAGNOSIS — T07XXXA Unspecified multiple injuries, initial encounter: Secondary | ICD-10-CM | POA: Insufficient documentation

## 2021-03-10 DIAGNOSIS — S52352S Displaced comminuted fracture of shaft of radius, left arm, sequela: Secondary | ICD-10-CM | POA: Insufficient documentation

## 2021-03-10 DIAGNOSIS — G5632 Lesion of radial nerve, left upper limb: Secondary | ICD-10-CM | POA: Insufficient documentation

## 2021-03-10 DIAGNOSIS — F4310 Post-traumatic stress disorder, unspecified: Secondary | ICD-10-CM | POA: Insufficient documentation

## 2021-03-18 DIAGNOSIS — G5632 Lesion of radial nerve, left upper limb: Secondary | ICD-10-CM | POA: Diagnosis not present

## 2021-03-18 DIAGNOSIS — G5602 Carpal tunnel syndrome, left upper limb: Secondary | ICD-10-CM | POA: Diagnosis not present

## 2021-03-25 DIAGNOSIS — S42352D Displaced comminuted fracture of shaft of humerus, left arm, subsequent encounter for fracture with routine healing: Secondary | ICD-10-CM | POA: Diagnosis not present

## 2021-03-25 DIAGNOSIS — S52252D Displaced comminuted fracture of shaft of ulna, left arm, subsequent encounter for closed fracture with routine healing: Secondary | ICD-10-CM | POA: Diagnosis not present

## 2021-03-25 DIAGNOSIS — S52352D Displaced comminuted fracture of shaft of radius, left arm, subsequent encounter for closed fracture with routine healing: Secondary | ICD-10-CM | POA: Diagnosis not present

## 2021-04-05 DIAGNOSIS — Z419 Encounter for procedure for purposes other than remedying health state, unspecified: Secondary | ICD-10-CM | POA: Diagnosis not present

## 2021-04-13 ENCOUNTER — Encounter: Payer: Medicaid Other | Attending: Physical Medicine and Rehabilitation | Admitting: Psychology

## 2021-04-13 DIAGNOSIS — G5632 Lesion of radial nerve, left upper limb: Secondary | ICD-10-CM | POA: Insufficient documentation

## 2021-04-13 DIAGNOSIS — S42352S Displaced comminuted fracture of shaft of humerus, left arm, sequela: Secondary | ICD-10-CM | POA: Insufficient documentation

## 2021-04-13 DIAGNOSIS — S52352S Displaced comminuted fracture of shaft of radius, left arm, sequela: Secondary | ICD-10-CM | POA: Insufficient documentation

## 2021-04-13 DIAGNOSIS — T07XXXA Unspecified multiple injuries, initial encounter: Secondary | ICD-10-CM | POA: Insufficient documentation

## 2021-04-13 DIAGNOSIS — F322 Major depressive disorder, single episode, severe without psychotic features: Secondary | ICD-10-CM | POA: Insufficient documentation

## 2021-04-13 DIAGNOSIS — F32A Depression, unspecified: Secondary | ICD-10-CM | POA: Insufficient documentation

## 2021-04-13 DIAGNOSIS — F4310 Post-traumatic stress disorder, unspecified: Secondary | ICD-10-CM | POA: Insufficient documentation

## 2021-05-06 DIAGNOSIS — Z419 Encounter for procedure for purposes other than remedying health state, unspecified: Secondary | ICD-10-CM | POA: Diagnosis not present

## 2021-06-05 DIAGNOSIS — Z419 Encounter for procedure for purposes other than remedying health state, unspecified: Secondary | ICD-10-CM | POA: Diagnosis not present

## 2021-06-18 ENCOUNTER — Encounter (HOSPITAL_COMMUNITY): Payer: Self-pay | Admitting: Occupational Therapy

## 2021-06-18 NOTE — Therapy (Signed)
[place], [place], [address] Phone: [phone]   Fax:  [phone]  Patient Details  Name: Jason Alexander MRN: [id number] Date of Birth: 12/24/1975 Referring Provider:  No ref. provider found  Encounter Date: 06/18/2021  OCCUPATIONAL THERAPY DISCHARGE SUMMARY  Visits from Start of Care: 15  Current functional level related to goals / functional outcomes: Unknown. Pt called and requested to be placed on hold in 02/2021. Pt has not returned since last visit on 01/29/2021.   Remaining deficits: At last session pt continued to have decreased strength, coordination, and functional use of left hand.   Education / Equipment: HEPs for functional use of LUE  Patient agrees to discharge. Patient goals were partially met. Patient is being discharged due to not returning since the last visit.Marland Kitchen      Guadelupe Sabin, OTR/L  [phone] 06/18/2021, 8:44 AM  [place], [place], [address] Phone: [phone]   Fax:  [phone]

## 2021-07-06 DIAGNOSIS — Z419 Encounter for procedure for purposes other than remedying health state, unspecified: Secondary | ICD-10-CM | POA: Diagnosis not present

## 2021-08-06 DIAGNOSIS — Z419 Encounter for procedure for purposes other than remedying health state, unspecified: Secondary | ICD-10-CM | POA: Diagnosis not present

## 2021-09-05 DIAGNOSIS — Z419 Encounter for procedure for purposes other than remedying health state, unspecified: Secondary | ICD-10-CM | POA: Diagnosis not present

## 2021-10-06 DIAGNOSIS — Z419 Encounter for procedure for purposes other than remedying health state, unspecified: Secondary | ICD-10-CM | POA: Diagnosis not present

## 2021-11-05 ENCOUNTER — Encounter (HOSPITAL_COMMUNITY): Payer: Self-pay | Admitting: Speech Pathology

## 2021-11-05 DIAGNOSIS — Z419 Encounter for procedure for purposes other than remedying health state, unspecified: Secondary | ICD-10-CM | POA: Diagnosis not present

## 2021-11-05 NOTE — Therapy (Signed)
[place], [place], [address] Phone: [phone]   Fax:  [phone]  Patient Details  Name: Jason Alexander MRN: [id number] Date of Birth: 1976/10/10 Referring Provider:  No ref. 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 Patient goals were partially met. Patient is being discharged due to not returning since the last visit.[name] you,  [name], [place]  [facility] 11/05/2021, 3:42 PM  [place] [address] [place], [place], [address] Phone: [phone]   Fax:  [phone]

## 2021-12-06 DIAGNOSIS — Z419 Encounter for procedure for purposes other than remedying health state, unspecified: Secondary | ICD-10-CM | POA: Diagnosis not present

## 2022-01-06 DIAGNOSIS — Z419 Encounter for procedure for purposes other than remedying health state, unspecified: Secondary | ICD-10-CM | POA: Diagnosis not present

## 2022-02-03 DIAGNOSIS — Z419 Encounter for procedure for purposes other than remedying health state, unspecified: Secondary | ICD-10-CM | POA: Diagnosis not present

## 2022-03-06 DIAGNOSIS — Z419 Encounter for procedure for purposes other than remedying health state, unspecified: Secondary | ICD-10-CM | POA: Diagnosis not present

## 2022-04-05 DIAGNOSIS — Z419 Encounter for procedure for purposes other than remedying health state, unspecified: Secondary | ICD-10-CM | POA: Diagnosis not present

## 2022-05-06 DIAGNOSIS — Z419 Encounter for procedure for purposes other than remedying health state, unspecified: Secondary | ICD-10-CM | POA: Diagnosis not present

## 2022-06-05 DIAGNOSIS — Z419 Encounter for procedure for purposes other than remedying health state, unspecified: Secondary | ICD-10-CM | POA: Diagnosis not present

## 2022-07-06 DIAGNOSIS — Z419 Encounter for procedure for purposes other than remedying health state, unspecified: Secondary | ICD-10-CM | POA: Diagnosis not present

## 2022-08-06 DIAGNOSIS — Z419 Encounter for procedure for purposes other than remedying health state, unspecified: Secondary | ICD-10-CM | POA: Diagnosis not present

## 2022-09-05 DIAGNOSIS — Z419 Encounter for procedure for purposes other than remedying health state, unspecified: Secondary | ICD-10-CM | POA: Diagnosis not present

## 2022-09-14 IMAGING — DX DG HUMERUS 2V *L*
2 series · 2 of 2 positions shown · non-contrast
Comparison: October 29, 2020

CLINICAL DATA: Status post ORIF

EXAM:
LEFT HUMERUS - 2+ VIEW

[humerus ap]
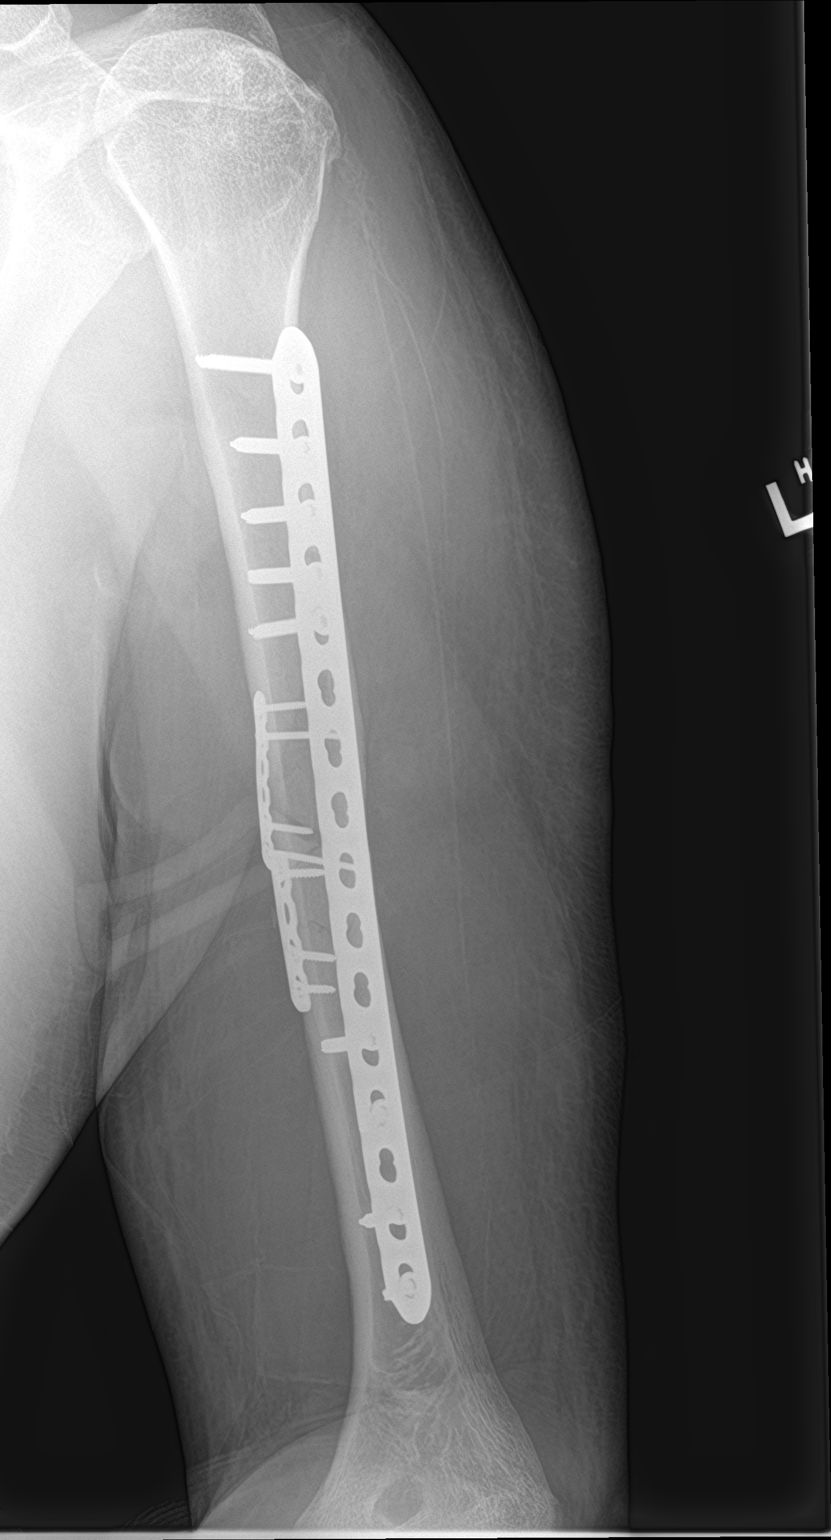

[humerus lat]
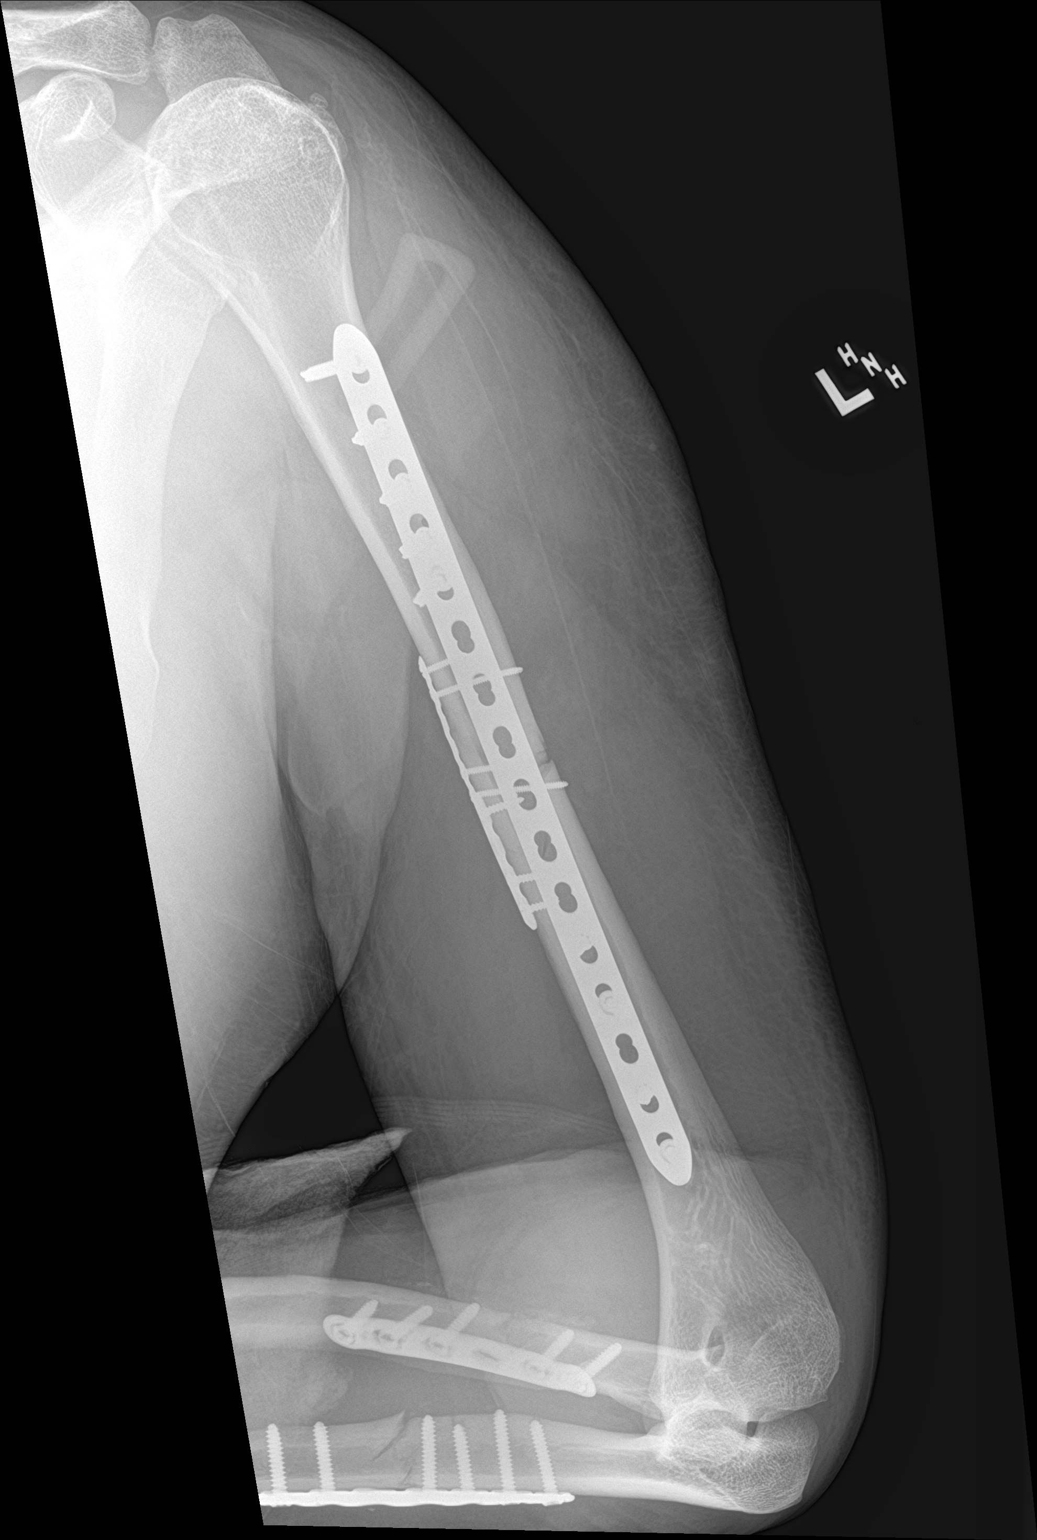

[2 of 2 positions shown; findings below may reference images not displayed]

FINDINGS: The patient is status post prior plate screw fixation of the left
humerus. The hardware is intact. The osseous alignment is unchanged.
There is no evidence for significant interval healing. There is
persistent soft tissue swelling about the left upper extremity.
IMPRESSION: Status post prior plate screw fixation of the left humerus without
evidence for significant interval healing.

## 2022-09-14 IMAGING — DX DG FEMUR 2+V*L*
4 series · 4 of 4 positions shown · non-contrast
Comparison: October 29, 2020

CLINICAL DATA: Status post intramedullary nail placement

EXAM:
LEFT FEMUR 2 VIEWS

[femur ap (1 of 2)]
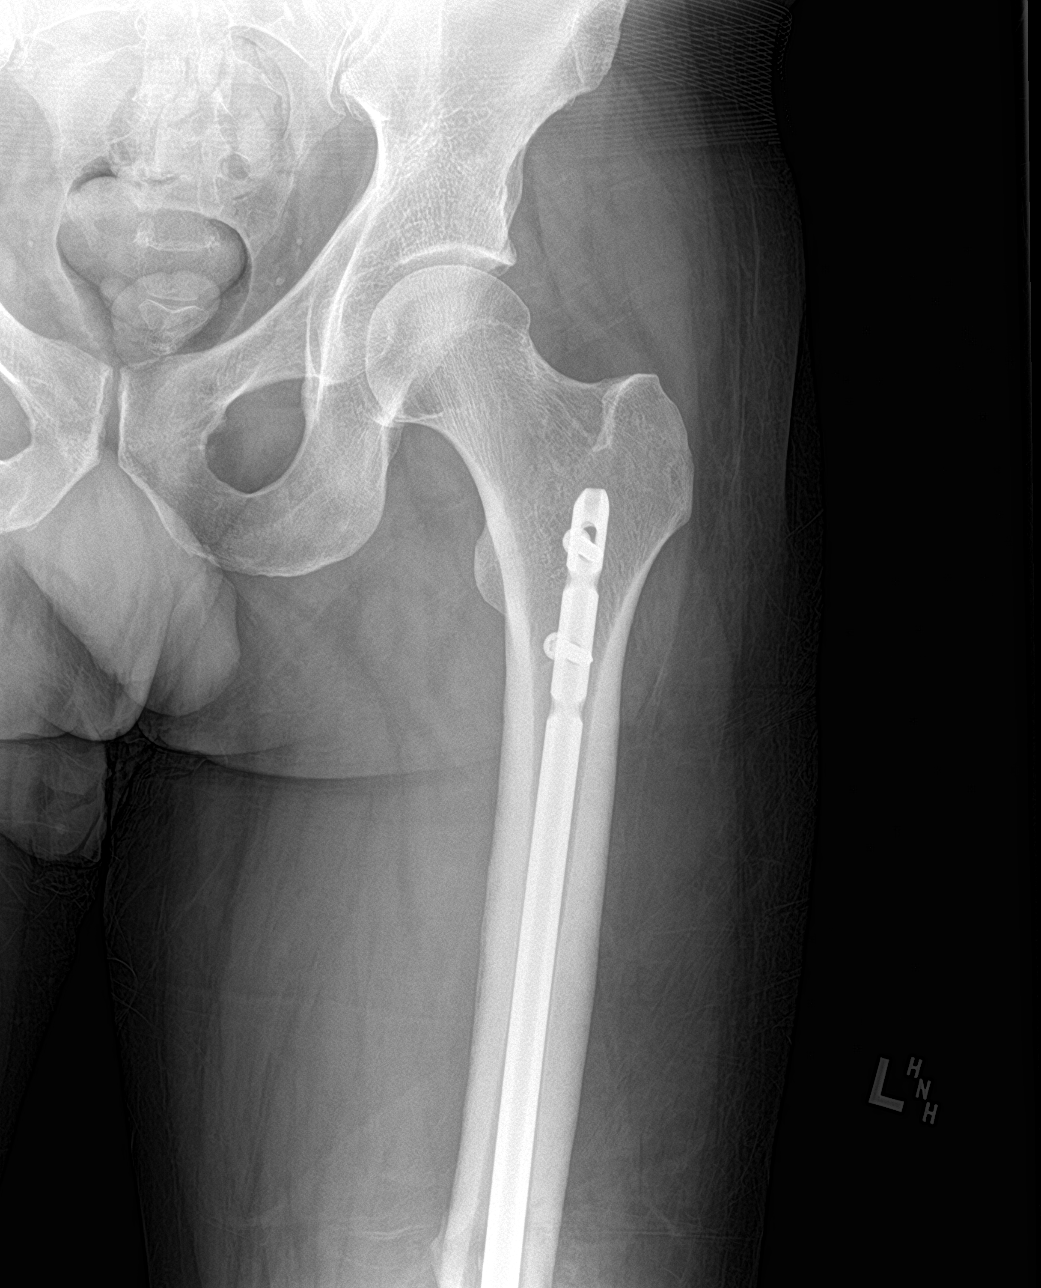

[femur ap (2 of 2)]
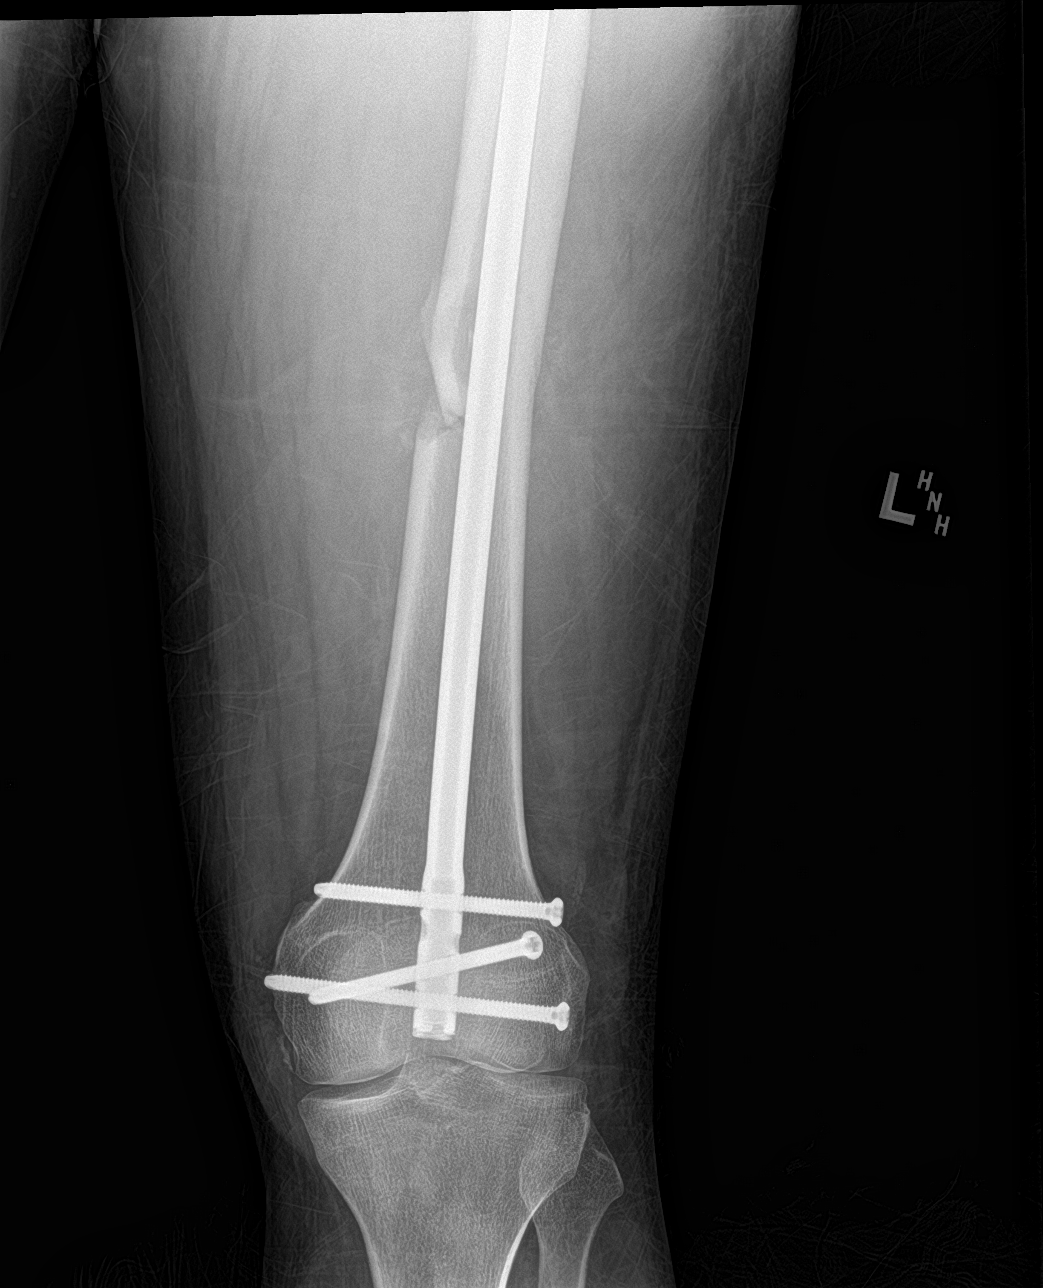

[femur lat (1 of 2)]
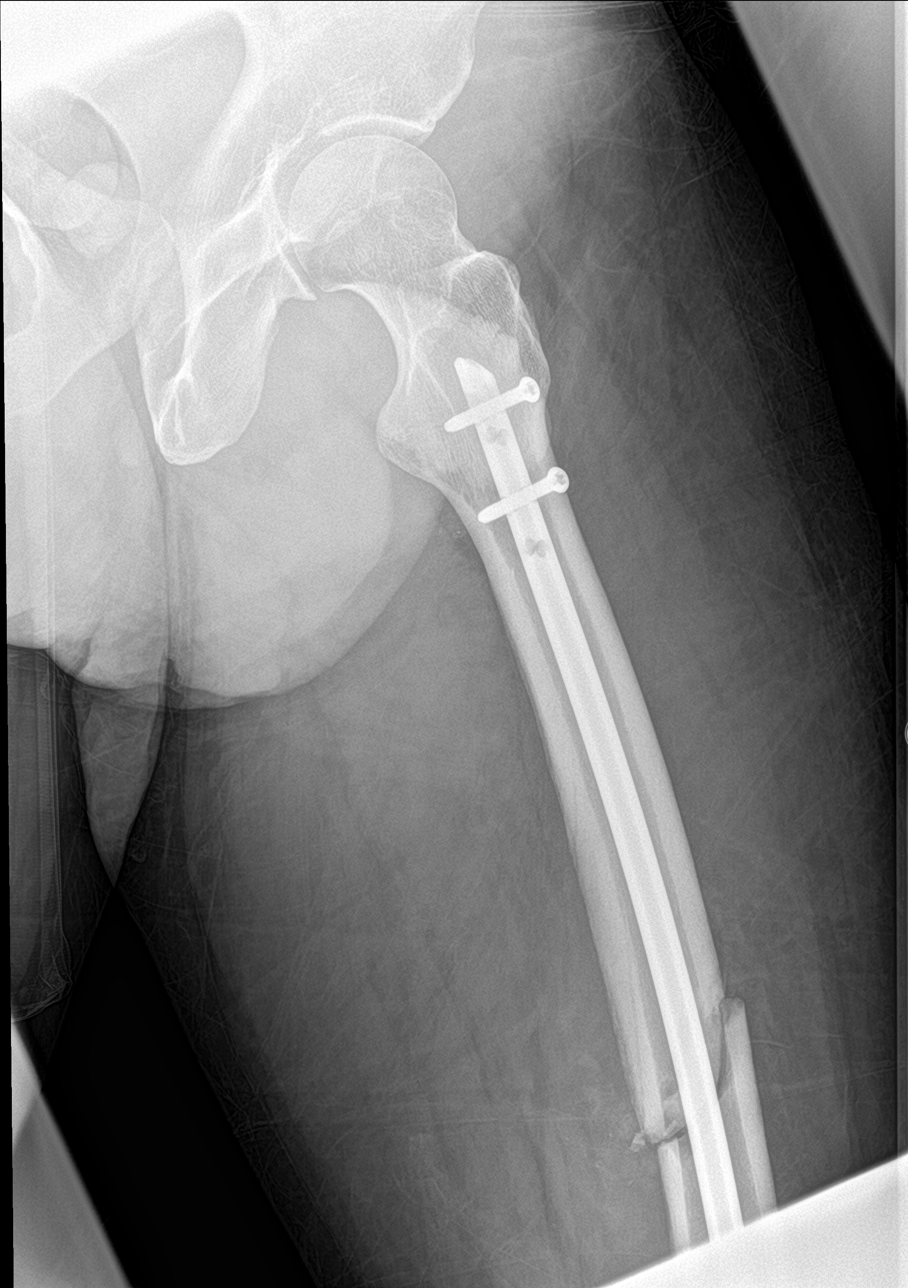

[femur lat (2 of 2)]
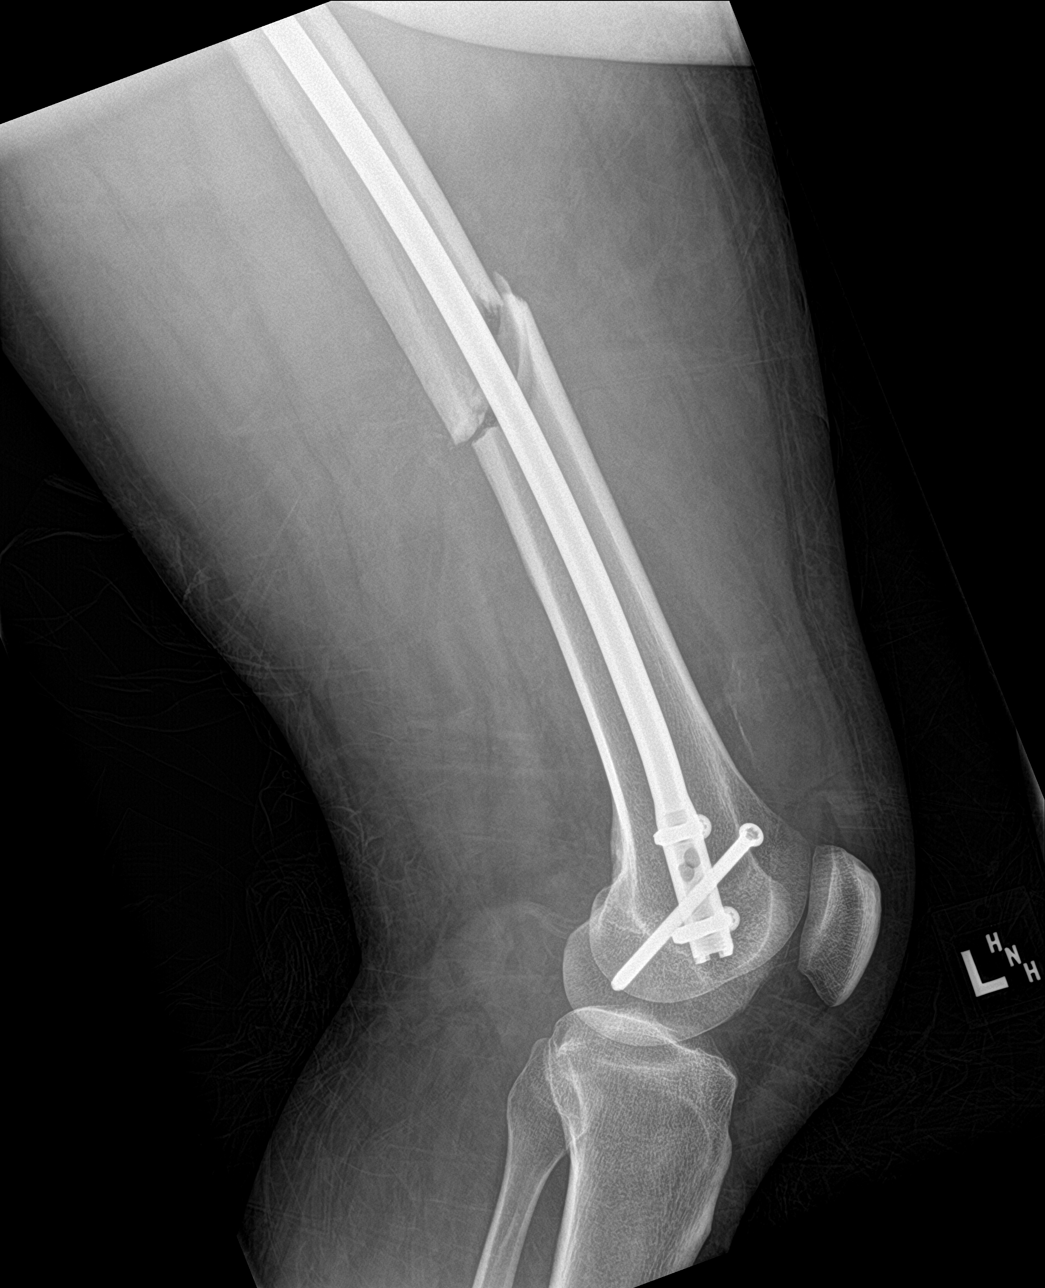

[4 of 4 positions shown; findings below may reference images not displayed]

FINDINGS: Again noted is an intramedullary nail through the left femur for the
previously demonstrated left femur fracture. The alignment is
unchanged. The hardware is intact. There is minimal interval healing
of the femoral fracture. There is a large suprapatellar joint
effusion. There is soft tissue swelling about the lower extremity.
IMPRESSION: Again noted is an intramedullary nail through the left femur for the
previously demonstrated left femur fracture.

## 2022-10-06 DIAGNOSIS — Z419 Encounter for procedure for purposes other than remedying health state, unspecified: Secondary | ICD-10-CM | POA: Diagnosis not present

## 2022-11-05 DIAGNOSIS — Z419 Encounter for procedure for purposes other than remedying health state, unspecified: Secondary | ICD-10-CM | POA: Diagnosis not present

## 2022-12-06 DIAGNOSIS — Z419 Encounter for procedure for purposes other than remedying health state, unspecified: Secondary | ICD-10-CM | POA: Diagnosis not present

## 2023-01-06 DIAGNOSIS — Z419 Encounter for procedure for purposes other than remedying health state, unspecified: Secondary | ICD-10-CM | POA: Diagnosis not present
